# Patient Record
Sex: Female | Born: 1985 | Race: White | Hispanic: No | Smoking: Current every day smoker
Health system: Southern US, Community
[De-identification: ages and names within clinical notes are randomized; demographics above are authoritative.]

## PROBLEM LIST (undated history)

## (undated) DIAGNOSIS — K219 Gastro-esophageal reflux disease without esophagitis: Secondary | ICD-10-CM

## (undated) DIAGNOSIS — N83209 Unspecified ovarian cyst, unspecified side: Secondary | ICD-10-CM

## (undated) DIAGNOSIS — Z1509 Genetic susceptibility to other malignant neoplasm: Secondary | ICD-10-CM

## (undated) DIAGNOSIS — Z1379 Encounter for other screening for genetic and chromosomal anomalies: Principal | ICD-10-CM

## (undated) DIAGNOSIS — N7091 Salpingitis, unspecified: Secondary | ICD-10-CM

## (undated) DIAGNOSIS — E079 Disorder of thyroid, unspecified: Secondary | ICD-10-CM

## (undated) DIAGNOSIS — F419 Anxiety disorder, unspecified: Secondary | ICD-10-CM

## (undated) DIAGNOSIS — T7840XA Allergy, unspecified, initial encounter: Secondary | ICD-10-CM

## (undated) DIAGNOSIS — C801 Malignant (primary) neoplasm, unspecified: Secondary | ICD-10-CM

## (undated) HISTORY — DX: Gastro-esophageal reflux disease without esophagitis: K21.9

## (undated) HISTORY — DX: Anxiety disorder, unspecified: F41.9

## (undated) HISTORY — PX: ROBOTIC ASSISTED TOTAL HYSTERECTOMY: SHX6085

## (undated) HISTORY — DX: Allergy, unspecified, initial encounter: T78.40XA

## (undated) HISTORY — DX: Encounter for other screening for genetic and chromosomal anomalies: Z13.79

## (undated) HISTORY — DX: Genetic susceptibility to other malignant neoplasm: Z15.09

## (undated) HISTORY — DX: Disorder of thyroid, unspecified: E07.9

---

## 2008-09-02 HISTORY — PX: APPENDECTOMY: SHX54

## 2016-09-02 HISTORY — PX: COLONOSCOPY: SHX174

## 2016-09-02 HISTORY — PX: UPPER GASTROINTESTINAL ENDOSCOPY: SHX188

## 2017-03-28 ENCOUNTER — Encounter (HOSPITAL_BASED_OUTPATIENT_CLINIC_OR_DEPARTMENT_OTHER): Payer: Self-pay

## 2017-03-28 ENCOUNTER — Inpatient Hospital Stay (HOSPITAL_BASED_OUTPATIENT_CLINIC_OR_DEPARTMENT_OTHER)
Admission: EM | Admit: 2017-03-28 | Discharge: 2017-04-08 | DRG: 329 | Disposition: A | Discharge status: Home / Self Care | Payer: Medicaid Other | Attending: Internal Medicine | Admitting: Internal Medicine

## 2017-03-28 ENCOUNTER — Emergency Department (HOSPITAL_BASED_OUTPATIENT_CLINIC_OR_DEPARTMENT_OTHER): Payer: Medicaid Other

## 2017-03-28 DIAGNOSIS — C180 Malignant neoplasm of cecum: Principal | ICD-10-CM | POA: Diagnosis present

## 2017-03-28 DIAGNOSIS — D509 Iron deficiency anemia, unspecified: Secondary | ICD-10-CM | POA: Diagnosis present

## 2017-03-28 DIAGNOSIS — D75839 Thrombocytosis, unspecified: Secondary | ICD-10-CM

## 2017-03-28 DIAGNOSIS — Z806 Family history of leukemia: Secondary | ICD-10-CM

## 2017-03-28 DIAGNOSIS — N7091 Salpingitis, unspecified: Secondary | ICD-10-CM | POA: Diagnosis present

## 2017-03-28 DIAGNOSIS — Z681 Body mass index (BMI) 19 or less, adult: Secondary | ICD-10-CM

## 2017-03-28 DIAGNOSIS — F17210 Nicotine dependence, cigarettes, uncomplicated: Secondary | ICD-10-CM | POA: Diagnosis present

## 2017-03-28 DIAGNOSIS — R1903 Right lower quadrant abdominal swelling, mass and lump: Secondary | ICD-10-CM

## 2017-03-28 DIAGNOSIS — D473 Essential (hemorrhagic) thrombocythemia: Secondary | ICD-10-CM

## 2017-03-28 DIAGNOSIS — Z716 Tobacco abuse counseling: Secondary | ICD-10-CM

## 2017-03-28 DIAGNOSIS — N739 Female pelvic inflammatory disease, unspecified: Secondary | ICD-10-CM | POA: Diagnosis present

## 2017-03-28 DIAGNOSIS — Z01818 Encounter for other preprocedural examination: Secondary | ICD-10-CM

## 2017-03-28 DIAGNOSIS — N7093 Salpingitis and oophoritis, unspecified: Secondary | ICD-10-CM

## 2017-03-28 DIAGNOSIS — R188 Other ascites: Secondary | ICD-10-CM | POA: Diagnosis present

## 2017-03-28 DIAGNOSIS — C189 Malignant neoplasm of colon, unspecified: Secondary | ICD-10-CM

## 2017-03-28 DIAGNOSIS — F419 Anxiety disorder, unspecified: Secondary | ICD-10-CM | POA: Diagnosis present

## 2017-03-28 DIAGNOSIS — Z720 Tobacco use: Secondary | ICD-10-CM

## 2017-03-28 DIAGNOSIS — E43 Unspecified severe protein-calorie malnutrition: Secondary | ICD-10-CM | POA: Diagnosis present

## 2017-03-28 DIAGNOSIS — D649 Anemia, unspecified: Secondary | ICD-10-CM

## 2017-03-28 DIAGNOSIS — R52 Pain, unspecified: Secondary | ICD-10-CM

## 2017-03-28 DIAGNOSIS — R599 Enlarged lymph nodes, unspecified: Secondary | ICD-10-CM | POA: Diagnosis present

## 2017-03-28 DIAGNOSIS — D638 Anemia in other chronic diseases classified elsewhere: Secondary | ICD-10-CM | POA: Diagnosis present

## 2017-03-28 DIAGNOSIS — G4700 Insomnia, unspecified: Secondary | ICD-10-CM | POA: Diagnosis present

## 2017-03-28 DIAGNOSIS — Z9049 Acquired absence of other specified parts of digestive tract: Secondary | ICD-10-CM

## 2017-03-28 HISTORY — DX: Salpingitis, unspecified: N70.91

## 2017-03-28 HISTORY — DX: Unspecified ovarian cyst, unspecified side: N83.209

## 2017-03-28 LAB — URINALYSIS, ROUTINE W REFLEX MICROSCOPIC
Bilirubin Urine: NEGATIVE
GLUCOSE, UA: NEGATIVE mg/dL
Hgb urine dipstick: NEGATIVE
Ketones, ur: NEGATIVE mg/dL
NITRITE: NEGATIVE
PH: 7.5 (ref 5.0–8.0)
Protein, ur: NEGATIVE mg/dL
SPECIFIC GRAVITY, URINE: 1.008 (ref 1.005–1.030)

## 2017-03-28 LAB — URINALYSIS, MICROSCOPIC (REFLEX): RBC / HPF: NONE SEEN RBC/hpf (ref 0–5)

## 2017-03-28 LAB — PREGNANCY, URINE: Preg Test, Ur: NEGATIVE

## 2017-03-28 NOTE — ED Provider Notes (Signed)
[place] DEPT [facility] Provider Note   CSN: [id number] Arrival date & time: 03/28/17  2257     History   Chief Complaint Chief Complaint  Patient presents with  . Abdominal Pain    HPI Meagan [name] is a 31 y.o. female.  The history is provided by the patient.  She has been having pain in the right suprapubic area for the last 4 days. Tonight, she noticed a knot there. Pain is worse with palpation, but nothing makes it better. She rates pain at 8/10. She denies radiation of pain. There has been no nausea or vomiting per denies fever, chills, sweats. She denies any urinary symptoms. No vaginal discharge. She does not have menses because she has the Norplanon implant. She had been hospitalized last month with a right tubo-ovarian abscess and is worried that it has recurred.  Past Medical History:  Diagnosis Date  . Fallopian tube abscess    June 2018 right side  . Ovarian cyst     There are no active problems to display for this patient.   Past Surgical History:  Procedure Laterality Date  . APPENDECTOMY  2010    OB History    No data available       Home Medications    Prior to Admission medications   Not on File    Family History No family history on file.  Social History Social History  Substance Use Topics  . Smoking status: Current Every Day Smoker  . Smokeless tobacco: Never Used  . Alcohol use No     Allergies   Patient has no known allergies.   Review of Systems Review of Systems  All other systems reviewed and are negative.    Physical Exam Updated Vital Signs BP 101/71 (BP Location: Left Arm)   Pulse (!) 120   Temp 99.3 F (37.4 C) (Oral)   Resp 18   Ht 5\' 2"  (1.575 m)   Wt 45.4 kg (100 lb)   SpO2 100%   BMI 18.29 kg/m   Physical Exam  Nursing note and vitals reviewed.  31 year old female, resting comfortably and in no acute distress. Vital signs are significant for tachycardia. Oxygen saturation is 100%, which is  normal. Head is normocephalic and atraumatic. PERRLA, EOMI. Oropharynx is clear. Neck is nontender and supple without adenopathy or JVD. Back is nontender and there is no CVA tenderness. Lungs are clear without rales, wheezes, or rhonchi. Chest is nontender. Heart has regular rate and rhythm without murmur. Abdomen is soft, flat with a tender subcutaneous mass in the right suprapubic area. It measures 7 x 8 cm. There are no other masses or hepatosplenomegaly and peristalsis is normoactive. Extremities have no cyanosis or edema, full range of motion is present. Skin is warm and dry without rash. Neurologic: Mental status is normal, cranial nerves are intact, there are no motor or sensory deficits.  ED Treatments / Results  Labs (all labs ordered are listed, but only abnormal results are displayed) Labs Reviewed  URINALYSIS, ROUTINE W REFLEX MICROSCOPIC - Abnormal; Notable for the following:       Result Value   APPearance CLOUDY (*)    Leukocytes, UA SMALL (*)    All other components within normal limits  URINALYSIS, MICROSCOPIC (REFLEX) - Abnormal; Notable for the following:    Bacteria, UA RARE (*)    Squamous Epithelial / LPF 6-30 (*)    All other components within normal limits  COMPREHENSIVE METABOLIC PANEL - Abnormal; Notable for  the following:    Sodium 134 (*)    Chloride 98 (*)    Calcium 8.6 (*)    Albumin 3.2 (*)    ALT 6 (*)    All other components within normal limits  CBC WITH DIFFERENTIAL/PLATELET - Abnormal; Notable for the following:    RBC 3.66 (*)    Hemoglobin 9.5 (*)    HCT 29.9 (*)    RDW 21.4 (*)    Platelets 533 (*)    Eosinophils Absolute 0.8 (*)    All other components within normal limits  PREGNANCY, URINE    Radiology US Transvaginal Non-ob  Result Date: 03/29/2017 CLINICAL DATA:  31 y/o F; right lower quadrant pain with a palpable lump for 4 days. History of right fallopian tube infection in June. EXAM: TRANSABDOMINAL AND TRANSVAGINAL  ULTRASOUND OF PELVIS TECHNIQUE: Both transabdominal and transvaginal ultrasound examinations of the pelvis were performed. Transabdominal technique was performed for global imaging of the pelvis including uterus, ovaries, adnexal regions, and pelvic cul-de-sac. It was necessary to proceed with endovaginal exam following the transabdominal exam to visualize the uterus and bilateral adnexa. COMPARISON:  None FINDINGS: Uterus Measurements: 5.8 x 2.8 x 4.6 cm. No fibroids or other mass visualized. Retroverted. Endometrium Thickness: 4 mm.  No focal abnormality visualized. Right ovary Measurements: 2.9 x 2.2 x 1.7 cm. Limited visibility of the right ovary, no gross abnormality. Tubular structure within the right adnexa isoechoic to the uterus with multiple small echogenic foci in the wall "Beads on a string" sign. The tubular structure does not demonstrate peristalsis during the ultrasound examination and there is no bowel wall echo signature. Left ovary Measurements: 2.2 x 1.5 x 1.7 cm. 1.2 cm simple ovarian follicle. Other findings Small volume of fluid in the pelvis. IMPRESSION: Right adnexal aperistaltic tubular structure containing complex fluid. Findings probably represent pyosalpinx given history of prior fallopian tube infection. Echogenic foci within the wall is an indicator of chronic inflammation. Electronically Signed   By: Kristine Garbe M.D.   On: 03/29/2017 00:29   US Pelvis Complete  Result Date: 03/29/2017 CLINICAL DATA:  31 y/o F; right lower quadrant pain with a palpable lump for 4 days. History of right fallopian tube infection in June. EXAM: TRANSABDOMINAL AND TRANSVAGINAL ULTRASOUND OF PELVIS TECHNIQUE: Both transabdominal and transvaginal ultrasound examinations of the pelvis were performed. Transabdominal technique was performed for global imaging of the pelvis including uterus, ovaries, adnexal regions, and pelvic cul-de-sac. It was necessary to proceed with endovaginal exam  following the transabdominal exam to visualize the uterus and bilateral adnexa. COMPARISON:  None FINDINGS: Uterus Measurements: 5.8 x 2.8 x 4.6 cm. No fibroids or other mass visualized. Retroverted. Endometrium Thickness: 4 mm.  No focal abnormality visualized. Right ovary Measurements: 2.9 x 2.2 x 1.7 cm. Limited visibility of the right ovary, no gross abnormality. Tubular structure within the right adnexa isoechoic to the uterus with multiple small echogenic foci in the wall "Beads on a string" sign. The tubular structure does not demonstrate peristalsis during the ultrasound examination and there is no bowel wall echo signature. Left ovary Measurements: 2.2 x 1.5 x 1.7 cm. 1.2 cm simple ovarian follicle. Other findings Small volume of fluid in the pelvis. IMPRESSION: Right adnexal aperistaltic tubular structure containing complex fluid. Findings probably represent pyosalpinx given history of prior fallopian tube infection. Echogenic foci within the wall is an indicator of chronic inflammation. Electronically Signed   By: Kristine Garbe M.D.   On: 03/29/2017 00:29    Procedures  Procedures (including critical care time)  Medications Ordered in ED Medications - No data to display   Initial Impression / Assessment and Plan / ED Course  I have reviewed the triage vital signs and the nursing notes.  Pertinent labs & imaging results that were available during my care of the patient were reviewed by me and considered in my medical decision making (see chart for details).  Suprapubic mass which is tender. Old records are reviewed, and she had been admitted to [facility] with presumed tubo-ovarian abscess last month, symptoms improved and she was discharged on antibiotics without surgical management. Of note, she had an appendectomy previously when she had laparoscopy for abdominal pain which was found to be mittelschmerz.  Ultrasound shows presence of right-sided tubo-ovarian  abscess. It is still not clear if this corresponds to the tender mass that is palpable on exam. Laboratory workup showed normal WBC, and moderate anemia which is unchanged from baseline. Thrombocytosis is present and is likely reactive. Patient had been cared for at [facility] and requested transfer there. I discussed the case with Dr. Gerrit Friends, who agreed to accept the patient, but beds were not available. Therefore, case was discussed with Dr. Glo Herring at [facility] who agreed to accept the patient in transfer. She was started on antibiotics-cefoxitin and doxycycline.  Final Clinical Impressions(s) / ED Diagnoses   Final diagnoses:  Pain  Tubo-ovarian abscess  Normochromic normocytic anemia  Thrombocytosis (HCC)    New Prescriptions New Prescriptions   No medications on file     Delora Fuel, MD 09/98/33 0301

## 2017-03-28 NOTE — ED Triage Notes (Signed)
Pt c/o RLQ pain and tenderness, she has a hard mass to the area, she had a fallopian tube abscess on the same side in June and she states it feels the same, the pain has been there for 4 days, she just noticed the hard mass today

## 2017-03-29 ENCOUNTER — Inpatient Hospital Stay (HOSPITAL_COMMUNITY): Payer: Medicaid Other

## 2017-03-29 ENCOUNTER — Encounter (HOSPITAL_COMMUNITY): Payer: Self-pay

## 2017-03-29 ENCOUNTER — Inpatient Hospital Stay (HOSPITAL_COMMUNITY): Admission: AD | Admit: 2017-03-29 | Payer: Self-pay | Source: Ambulatory Visit | Admitting: Internal Medicine

## 2017-03-29 DIAGNOSIS — Z9049 Acquired absence of other specified parts of digestive tract: Secondary | ICD-10-CM | POA: Diagnosis not present

## 2017-03-29 DIAGNOSIS — D638 Anemia in other chronic diseases classified elsewhere: Secondary | ICD-10-CM | POA: Diagnosis present

## 2017-03-29 DIAGNOSIS — N739 Female pelvic inflammatory disease, unspecified: Secondary | ICD-10-CM | POA: Diagnosis present

## 2017-03-29 DIAGNOSIS — Z681 Body mass index (BMI) 19 or less, adult: Secondary | ICD-10-CM | POA: Diagnosis not present

## 2017-03-29 DIAGNOSIS — R1903 Right lower quadrant abdominal swelling, mass and lump: Secondary | ICD-10-CM | POA: Diagnosis present

## 2017-03-29 DIAGNOSIS — R188 Other ascites: Secondary | ICD-10-CM | POA: Diagnosis present

## 2017-03-29 DIAGNOSIS — N7093 Salpingitis and oophoritis, unspecified: Secondary | ICD-10-CM | POA: Insufficient documentation

## 2017-03-29 DIAGNOSIS — N7091 Salpingitis, unspecified: Secondary | ICD-10-CM | POA: Diagnosis present

## 2017-03-29 DIAGNOSIS — D649 Anemia, unspecified: Secondary | ICD-10-CM | POA: Diagnosis present

## 2017-03-29 DIAGNOSIS — D509 Iron deficiency anemia, unspecified: Secondary | ICD-10-CM | POA: Diagnosis present

## 2017-03-29 DIAGNOSIS — Z716 Tobacco abuse counseling: Secondary | ICD-10-CM | POA: Diagnosis not present

## 2017-03-29 DIAGNOSIS — Z806 Family history of leukemia: Secondary | ICD-10-CM | POA: Diagnosis not present

## 2017-03-29 DIAGNOSIS — R599 Enlarged lymph nodes, unspecified: Secondary | ICD-10-CM | POA: Diagnosis present

## 2017-03-29 DIAGNOSIS — E43 Unspecified severe protein-calorie malnutrition: Secondary | ICD-10-CM | POA: Diagnosis present

## 2017-03-29 DIAGNOSIS — F419 Anxiety disorder, unspecified: Secondary | ICD-10-CM | POA: Diagnosis present

## 2017-03-29 DIAGNOSIS — G4700 Insomnia, unspecified: Secondary | ICD-10-CM | POA: Diagnosis present

## 2017-03-29 DIAGNOSIS — C180 Malignant neoplasm of cecum: Secondary | ICD-10-CM | POA: Diagnosis not present

## 2017-03-29 DIAGNOSIS — D500 Iron deficiency anemia secondary to blood loss (chronic): Secondary | ICD-10-CM | POA: Diagnosis not present

## 2017-03-29 DIAGNOSIS — F17210 Nicotine dependence, cigarettes, uncomplicated: Secondary | ICD-10-CM | POA: Diagnosis present

## 2017-03-29 LAB — CBC WITH DIFFERENTIAL/PLATELET
BASOS PCT: 0 %
Basophils Absolute: 0 10*3/uL (ref 0.0–0.1)
EOS PCT: 9 %
Eosinophils Absolute: 0.8 10*3/uL — ABNORMAL HIGH (ref 0.0–0.7)
HEMATOCRIT: 29.9 % — AB (ref 36.0–46.0)
HEMOGLOBIN: 9.5 g/dL — AB (ref 12.0–15.0)
LYMPHS ABS: 1.8 10*3/uL (ref 0.7–4.0)
LYMPHS PCT: 21 %
MCH: 26 pg (ref 26.0–34.0)
MCHC: 31.8 g/dL (ref 30.0–36.0)
MCV: 81.7 fL (ref 78.0–100.0)
MONOS PCT: 8 %
Monocytes Absolute: 0.7 10*3/uL (ref 0.1–1.0)
NEUTROS ABS: 5.3 10*3/uL (ref 1.7–7.7)
Neutrophils Relative %: 62 %
Platelets: 533 10*3/uL — ABNORMAL HIGH (ref 150–400)
RBC: 3.66 MIL/uL — ABNORMAL LOW (ref 3.87–5.11)
RDW: 21.4 % — AB (ref 11.5–15.5)
WBC: 8.6 10*3/uL (ref 4.0–10.5)

## 2017-03-29 LAB — COMPREHENSIVE METABOLIC PANEL
ALBUMIN: 3.2 g/dL — AB (ref 3.5–5.0)
ALK PHOS: 69 U/L (ref 38–126)
ALT: 6 U/L — ABNORMAL LOW (ref 14–54)
ANION GAP: 10 (ref 5–15)
AST: 15 U/L (ref 15–41)
BILIRUBIN TOTAL: 0.3 mg/dL (ref 0.3–1.2)
BUN: 8 mg/dL (ref 6–20)
CO2: 26 mmol/L (ref 22–32)
Calcium: 8.6 mg/dL — ABNORMAL LOW (ref 8.9–10.3)
Chloride: 98 mmol/L — ABNORMAL LOW (ref 101–111)
Creatinine, Ser: 0.75 mg/dL (ref 0.44–1.00)
GFR calc Af Amer: >60 mL/min (ref >60)
GFR calc non Af Amer: >60 mL/min (ref >60)
GLUCOSE: 97 mg/dL (ref 65–99)
POTASSIUM: 3.5 mmol/L (ref 3.5–5.1)
SODIUM: 134 mmol/L — AB (ref 135–145)
Total Protein: 8.1 g/dL (ref 6.5–8.1)

## 2017-03-29 LAB — RAPID URINE DRUG SCREEN, HOSP PERFORMED
Amphetamines: NOT DETECTED
BARBITURATES: NOT DETECTED
BENZODIAZEPINES: NOT DETECTED
Cocaine: NOT DETECTED
Opiates: POSITIVE — AB
Tetrahydrocannabinol: NOT DETECTED

## 2017-03-29 LAB — CREATININE, SERUM: Creatinine, Ser: 0.57 mg/dL (ref 0.44–1.00)

## 2017-03-29 LAB — CBC
HCT: 27 % — ABNORMAL LOW (ref 36.0–46.0)
HEMOGLOBIN: 8.3 g/dL — AB (ref 12.0–15.0)
MCH: 25.3 pg — ABNORMAL LOW (ref 26.0–34.0)
MCHC: 30.7 g/dL (ref 30.0–36.0)
MCV: 82.3 fL (ref 78.0–100.0)
PLATELETS: 418 10*3/uL — AB (ref 150–400)
RBC: 3.28 MIL/uL — AB (ref 3.87–5.11)
RDW: 21.5 % — ABNORMAL HIGH (ref 11.5–15.5)
WBC: 6.9 10*3/uL (ref 4.0–10.5)

## 2017-03-29 LAB — TYPE AND SCREEN
ABO/RH(D): O POS
ANTIBODY SCREEN: NEGATIVE

## 2017-03-29 LAB — ABO/RH: ABO/RH(D): O POS

## 2017-03-29 LAB — SEDIMENTATION RATE: SED RATE: 72 mm/h — AB (ref 0–22)

## 2017-03-29 MED ORDER — MORPHINE SULFATE (PF) 4 MG/ML IV SOLN: 4.0000 mg | INTRAVENOUS | Status: DC | PRN

## 2017-03-29 MED ORDER — DOXYCYCLINE HYCLATE 100 MG IV SOLR
INTRAVENOUS | Status: AC
  Filled 2017-03-29: qty 100

## 2017-03-29 MED ORDER — IOPAMIDOL (ISOVUE-300) INJECTION 61%
100.0000 mL | Freq: Once | INTRAVENOUS | Status: AC | PRN
  Administered 2017-03-29: 80 mL via INTRAVENOUS

## 2017-03-29 MED ORDER — PRENATAL MULTIVITAMIN CH: 1.0000 | ORAL_TABLET | Freq: Every day | ORAL | Status: DC

## 2017-03-29 MED ORDER — DEXTROSE 5 % IV SOLN
1.0000 g | Freq: Two times a day (BID) | INTRAVENOUS | Status: DC
  Filled 2017-03-29: qty 1

## 2017-03-29 MED ORDER — ONDANSETRON HCL 4 MG/2ML IJ SOLN
4.0000 mg | Freq: Four times a day (QID) | INTRAMUSCULAR | Status: DC | PRN
  Administered 2017-04-03: 4 mg via INTRAVENOUS
  Filled 2017-03-29: qty 2

## 2017-03-29 MED ORDER — NICOTINE 21 MG/24HR TD PT24
21.0000 mg | MEDICATED_PATCH | Freq: Every day | TRANSDERMAL | Status: DC
  Administered 2017-03-30 – 2017-03-31 (×2): 21 mg via TRANSDERMAL
  Filled 2017-03-29 (×7): qty 1

## 2017-03-29 MED ORDER — CEFOTETAN DISODIUM 2 G IJ SOLR
2.0000 g | Freq: Two times a day (BID) | INTRAMUSCULAR | Status: DC
  Filled 2017-03-29: qty 2

## 2017-03-29 MED ORDER — MORPHINE SULFATE (PF) 4 MG/ML IV SOLN
4.0000 mg | Freq: Once | INTRAVENOUS | Status: AC
  Administered 2017-03-29: 4 mg via INTRAVENOUS
  Filled 2017-03-29: qty 1

## 2017-03-29 MED ORDER — DEXTROSE 5 % IV SOLN
1.0000 g | Freq: Two times a day (BID) | INTRAVENOUS | Status: DC
  Administered 2017-03-29: 1 g via INTRAVENOUS
  Filled 2017-03-29 (×3): qty 1

## 2017-03-29 MED ORDER — SODIUM CHLORIDE 0.9 % IV SOLN
INTRAVENOUS | Status: DC
  Administered 2017-03-29 – 2017-03-31 (×2): via INTRAVENOUS

## 2017-03-29 MED ORDER — DEXTROSE 5 % IV SOLN
2.0000 g | Freq: Once | INTRAVENOUS | Status: AC
  Administered 2017-03-29: 2 g via INTRAVENOUS
  Filled 2017-03-29: qty 2

## 2017-03-29 MED ORDER — ACETAMINOPHEN 325 MG PO TABS: 650.0000 mg | ORAL_TABLET | Freq: Four times a day (QID) | ORAL | Status: DC | PRN

## 2017-03-29 MED ORDER — SODIUM CHLORIDE 0.9 % IV BOLUS (SEPSIS)
1000.0000 mL | Freq: Once | INTRAVENOUS | Status: AC
  Administered 2017-03-29: 1000 mL via INTRAVENOUS

## 2017-03-29 MED ORDER — HYDROMORPHONE HCL 2 MG PO TABS
1.0000 mg | ORAL_TABLET | ORAL | Status: DC | PRN
  Administered 2017-03-30 – 2017-04-03 (×12): 1 mg via ORAL
  Filled 2017-03-29 (×12): qty 1

## 2017-03-29 MED ORDER — ENOXAPARIN SODIUM 40 MG/0.4ML ~~LOC~~ SOLN
40.0000 mg | SUBCUTANEOUS | Status: DC
  Filled 2017-03-29: qty 0.4

## 2017-03-29 MED ORDER — HYDROMORPHONE HCL 2 MG PO TABS
2.0000 mg | ORAL_TABLET | ORAL | Status: DC | PRN
  Administered 2017-03-29 (×2): 2 mg via ORAL
  Filled 2017-03-29 (×2): qty 1

## 2017-03-29 MED ORDER — DOXYCYCLINE HYCLATE 100 MG PO TABS
100.0000 mg | ORAL_TABLET | Freq: Two times a day (BID) | ORAL | Status: DC
  Administered 2017-03-29: 100 mg via ORAL
  Filled 2017-03-29 (×3): qty 1

## 2017-03-29 MED ORDER — DOCUSATE SODIUM 100 MG PO CAPS: 100.0000 mg | ORAL_CAPSULE | Freq: Two times a day (BID) | ORAL | Status: DC | PRN

## 2017-03-29 MED ORDER — DOXYCYCLINE HYCLATE 100 MG IV SOLR
100.0000 mg | Freq: Once | INTRAVENOUS | Status: AC
  Administered 2017-03-29: 100 mg via INTRAVENOUS
  Filled 2017-03-29: qty 100

## 2017-03-29 MED ORDER — IOPAMIDOL (ISOVUE-300) INJECTION 61%
30.0000 mL | INTRAVENOUS | Status: AC
  Administered 2017-03-29 (×2): 30 mL via ORAL

## 2017-03-29 NOTE — H&P (Signed)
History and Physical    Meagan [name] INO:[id number] DOB: 1986-02-04 DOA: 03/28/2017  PCP: Patient, No Pcp Per  Patient coming from: Home, Transferred from [facility]  I have personally briefly reviewed patient's old medical records in [place]  Chief Complaint: RLQ abd pain  HPI: Meagan [name] is a 31 y.o. female with medical history significant of Appendectomy in 2010.  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  Started on ABx.  CT scan showed 10cm mass worrisome for primary malignancy that seems to rise from the cecum.  GI Dr. Loletha Carrow saw patient, initially though that surgery should be consulted and didn't want to do colonoscopy.  Dr. Redmond Pulling saw patient, felt that colonoscopy should be performed.  Per his note called Dr. Loletha Carrow back, and decision was made to transfer to [facility] for GI to see patient tomorrow Nancy Fetter), likely for bowel prep and subsequent colonoscopy Monday.    Review of Systems: As per HPI otherwise 10 point review of systems negative.   Past Medical History:  Diagnosis Date  . Fallopian tube abscess    June 2018 right side  . Ovarian cyst     Past Surgical History:  Procedure Laterality Date  . APPENDECTOMY  2010     reports that she has been smoking.  She has never used smokeless tobacco. She reports that she does not drink alcohol or use drugs.  No Known Allergies  Family History  Problem Relation Age of Onset  . Leukemia Father        "Just got diagnosed, but not the 'serious type'" (sounds like CLL if I had to guess)  No other malignancy history in family.   Prior to Admission medications   Medication Sig Start Date End Date Taking? Authorizing Provider  ibuprofen (ADVIL,MOTRIN) 800 MG tablet Take 800 mg by mouth. 02/15/17  Yes [provider]    Physical Exam: Vitals:   03/29/17  1258 03/29/17 1635 03/29/17 2003 03/29/17 2238  BP: 99/64 (!) 84/61 94/65 98/66   Pulse: (!) 104 97 100 (!) 107  Resp: 18 18 18 18   Temp: 98.4 F (36.9 C) 97.9 F (36.6 C) 99.3 F (37.4 C) 98 F (36.7 C)  TempSrc: Oral Oral Oral Oral  SpO2: 100% 98% 100% 100%  Weight:    45.3 kg (99 lb 14.4 oz)  Height:    5\' 2"  (1.575 m)    Constitutional: NAD, calm, comfortable Eyes: PERRL, lids and conjunctivae normal ENMT: Mucous membranes are moist. Posterior pharynx clear of any exudate or lesions.Normal dentition.  Neck: normal, supple, no masses, no thyromegaly Respiratory: clear to auscultation bilaterally, no wheezing, no crackles. Normal respiratory effort. No accessory muscle use.  Cardiovascular: Regular rate and rhythm, no murmurs / rubs / gallops. No extremity edema. 2+ pedal pulses. No carotid bruits.  Abdomen: Tender mass RLQ  Musculoskeletal: no clubbing / cyanosis. No joint deformity upper and lower extremities. Good ROM, no contractures. Normal muscle tone.  Skin: no rashes, lesions, ulcers. No induration Neurologic: CN 2-12 grossly intact. Sensation intact, DTR normal. Strength 5/5 in all 4.  Psychiatric: Patient is anxious and understandably unhappy given that she has just been told earlier today that physicians are concerned she likely has a neoplastic process.   Labs on Admission: I have personally reviewed following labs and imaging studies  CBC:  Recent Labs Lab 03/29/17 0034 03/29/17 0640  WBC 8.6 6.9  NEUTROABS 5.3  --   HGB 9.5* 8.3*  HCT 29.9* 27.0*  MCV 81.7 82.3  PLT 533* 169*   Basic Metabolic Panel:  Recent Labs Lab 03/29/17 0034 03/29/17 0640  NA 134*  --   K 3.5  --   CL 98*  --   CO2 26  --   GLUCOSE 97  --   BUN 8  --   CREATININE 0.75 0.57  CALCIUM 8.6*  --    GFR: Estimated Creatinine Clearance: 72.2 mL/min (by C-G formula based on SCr of 0.57 mg/dL). Liver Function Tests:  Recent Labs Lab 03/29/17 0034  AST 15  ALT 6*  ALKPHOS  69  BILITOT 0.3  PROT 8.1  ALBUMIN 3.2*   No results for input(s): LIPASE, AMYLASE in the last 168 hours. No results for input(s): AMMONIA in the last 168 hours. Coagulation Profile: No results for input(s): INR, PROTIME in the last 168 hours. Cardiac Enzymes: No results for input(s): CKTOTAL, CKMB, CKMBINDEX, TROPONINI in the last 168 hours. BNP (last 3 results) No results for input(s): PROBNP in the last 8760 hours. HbA1C: No results for input(s): HGBA1C in the last 72 hours. CBG: No results for input(s): GLUCAP in the last 168 hours. Lipid Profile: No results for input(s): CHOL, HDL, LDLCALC, TRIG, CHOLHDL, LDLDIRECT in the last 72 hours. Thyroid Function Tests: No results for input(s): TSH, T4TOTAL, FREET4, T3FREE, THYROIDAB in the last 72 hours. Anemia Panel: No results for input(s): VITAMINB12, FOLATE, FERRITIN, TIBC, IRON, RETICCTPCT in the last 72 hours. Urine analysis:    Component Value Date/Time   COLORURINE YELLOW 03/28/2017 2310   APPEARANCEUR CLOUDY (A) 03/28/2017 2310   LABSPEC 1.008 03/28/2017 2310   PHURINE 7.5 03/28/2017 2310   GLUCOSEU NEGATIVE 03/28/2017 2310   HGBUR NEGATIVE 03/28/2017 2310   BILIRUBINUR NEGATIVE 03/28/2017 2310   KETONESUR NEGATIVE 03/28/2017 2310   PROTEINUR NEGATIVE 03/28/2017 2310   NITRITE NEGATIVE 03/28/2017 2310   LEUKOCYTESUR SMALL (A) 03/28/2017 2310    Radiological Exams on Admission: US Transvaginal Non-ob  Result Date: 03/29/2017 CLINICAL DATA:  31 y/o F; right lower quadrant pain with a palpable lump for 4 days. History of right fallopian tube infection in June. EXAM: TRANSABDOMINAL AND TRANSVAGINAL ULTRASOUND OF PELVIS TECHNIQUE: Both transabdominal and transvaginal ultrasound examinations of the pelvis were performed. Transabdominal technique was performed for global imaging of the pelvis including uterus, ovaries, adnexal regions, and pelvic cul-de-sac. It was necessary to proceed with endovaginal exam following the  transabdominal exam to visualize the uterus and bilateral adnexa. COMPARISON:  None FINDINGS: Uterus Measurements: 5.8 x 2.8 x 4.6 cm. No fibroids or other mass visualized. Retroverted. Endometrium Thickness: 4 mm.  No focal abnormality visualized. Right ovary Measurements: 2.9 x 2.2 x 1.7 cm. Limited visibility of the right ovary, no gross abnormality. Tubular structure within the right adnexa isoechoic to the uterus with multiple small echogenic foci in the wall "Beads on a string" sign. The tubular structure does not demonstrate peristalsis during the ultrasound examination and there is no bowel wall echo signature. Left ovary Measurements: 2.2 x 1.5 x 1.7 cm. 1.2 cm simple ovarian follicle. Other findings Small volume of fluid in the pelvis. IMPRESSION: Right adnexal aperistaltic tubular structure containing complex fluid. Findings probably represent pyosalpinx given history of prior fallopian tube infection. Echogenic foci within the wall is an indicator of chronic inflammation. Electronically Signed   By: Edgardo Roys.D.  On: 03/29/2017 00:29   US Pelvis Complete  Result Date: 03/29/2017 CLINICAL DATA:  31 y/o F; right lower quadrant pain with a palpable lump for 4 days. History of right fallopian tube infection in June. EXAM: TRANSABDOMINAL AND TRANSVAGINAL ULTRASOUND OF PELVIS TECHNIQUE: Both transabdominal and transvaginal ultrasound examinations of the pelvis were performed. Transabdominal technique was performed for global imaging of the pelvis including uterus, ovaries, adnexal regions, and pelvic cul-de-sac. It was necessary to proceed with endovaginal exam following the transabdominal exam to visualize the uterus and bilateral adnexa. COMPARISON:  None FINDINGS: Uterus Measurements: 5.8 x 2.8 x 4.6 cm. No fibroids or other mass visualized. Retroverted. Endometrium Thickness: 4 mm.  No focal abnormality visualized. Right ovary Measurements: 2.9 x 2.2 x 1.7 cm. Limited visibility of  the right ovary, no gross abnormality. Tubular structure within the right adnexa isoechoic to the uterus with multiple small echogenic foci in the wall "Beads on a string" sign. The tubular structure does not demonstrate peristalsis during the ultrasound examination and there is no bowel wall echo signature. Left ovary Measurements: 2.2 x 1.5 x 1.7 cm. 1.2 cm simple ovarian follicle. Other findings Small volume of fluid in the pelvis. IMPRESSION: Right adnexal aperistaltic tubular structure containing complex fluid. Findings probably represent pyosalpinx given history of prior fallopian tube infection. Echogenic foci within the wall is an indicator of chronic inflammation. Electronically Signed   By: Kristine Garbe M.D.   On: 03/29/2017 00:29   Ct Abdomen Pelvis W Contrast  Result Date: 03/29/2017 CLINICAL DATA:  Abdominal pain for 6 weeks. History of tubo-ovarian abscess. EXAM: CT ABDOMEN AND PELVIS WITH CONTRAST TECHNIQUE: Multidetector CT imaging of the abdomen and pelvis was performed using the standard protocol following bolus administration of intravenous contrast. CONTRAST:  89mL ISOVUE-300 IOPAMIDOL (ISOVUE-300) INJECTION 61% COMPARISON:  None. FINDINGS: Lower chest: No acute abnormality. Hepatobiliary: No focal liver abnormality is seen. No gallstones, gallbladder wall thickening, or biliary dilatation. Pancreas: Unremarkable. No pancreatic ductal dilatation or surrounding inflammatory changes. Spleen: Normal in size without focal abnormality. Adrenals/Urinary Tract: The adrenal glands are normal. Kidneys are unremarkable. Unremarkable appearance of the urinary bladder. Stomach/Bowel: The stomach is normal. No pathologic dilatation of the large or small bowel loops. Vascular/Lymphatic: Normal appearance of the abdominal aorta. At the level of the aortic bifurcation there is a right common iliac node measuring 1.4 cm, image 38 of series 2. Adjacent enlarged lymph node measures 1.1 cm, image 40  of series 2. Reproductive: The uterus is grossly unremarkable. Other: Within the right iliac fossa there is a large necrotic mass with thick enhancing rim. The mass measures 10.3 cm, image number 32 of series 602. It is unclear whether not this mass is arising from the right adnexa or from the cecum. There is S suture material associated with this mass which is of on certain etiology. Multiple smaller surrounding masses are identified. For example within the right lower quadrant mesenteric there is a 2.6 cm solid mass, image 48 of series 2. Areas of loculated ascites are identified within the dependent portion of the pelvis. Musculoskeletal: No acute or significant osseous findings. IMPRESSION: 1. There is a large thick walled necrotic mass within the right lower quadrant of the abdomen centered around the right iliac fossa. This measures up to 10 cm in length. Highly suspicious for malignancy. Origin is indeterminate. This may be arising from the cecum or right adnexa. At this time there is no evidence for bowel obstruction or obstructive uropathy. 2. 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 3. Loculated ascites within the pelvis. Electronically Signed   By: Kerby Moors M.D.   On: 03/29/2017 10:18    EKG: Independently reviewed.  Assessment/Plan Principal Problem:   Right lower quadrant abdominal mass Active Problems:   Anemia    1. RLQ abd mass - 1. Concern that this likely represents primary malignancy seems to be favored over infectious cause especially given recent history, lack of SIRS (other than mild tachycardia) 2. As Per Dr. Dois Davenport note recs: 1. Clear liquid diet 2. Have GI see patient tomorrow (Sunday) likely to begin bowel prep and then colonoscopy Monday 3. Abx DC'd 3. Dilaudid PRN pain 2. Anemia - 1. Repeat CBC in AM  DVT prophylaxis: SCDs Code Status: Full Family Communication: Husband at bedside Disposition Plan: Home after  admit Consults called: None called, Surg and GI have seen earlier today Admission status: Admit to inpatient   [place], [place] [facility] Pager [phone]  If 7AM-7PM, please contact day team taking care of patient [email or web address] Password [email or web address]  03/29/2017, 11:26 PM

## 2017-03-29 NOTE — ED Notes (Signed)
Alert, NAD, calm, interactive, resps e/u, speaking in clear complete sentences, no dyspnea noted, skin W&D, VSS, rates pain 4/10, (denies: sob, nausea, dizziness or visual changes). Pt playing phone games. Family at [facility].

## 2017-03-29 NOTE — H&P (Signed)
Meagan [name] is an 31 y.o. female. She is a G3 P3, SVD x 3, on Nexplanon, who was recently admitted in [facility] for PID, discharged after 3 day stay one month ago to complete an outpatient oral antibiotic dose including Doxycycline. She had not heard from [facility] re; followup. She was in her usual state of health, tolerating diet, until developing Rlq discomfort over the last few days, no fever nausea or vomiting, and then she felt a mass in RLQ, and presented to [facility] where the mass is confirmed on abdominal exam, and U/S shows an anteflexed retroverted uterus with an adjacent serpentine mass with internal debris suggesting chronic inflammation  Pertinent Gynecological History: Menses: amenorrheic x 18 months due to Nexplanon Bleeding:  Contraception: Nexplanon DES exposure: unknown Blood transfusions: none Sexually transmitted diseases: recent diagnosis: PID with TOA /d/c from hospital at [facility] day. Previous GYN Procedures:   Last mammogram:  Date:  Last pap:  Date:  OB History: G3, P3  Surgical History: S/p APPENDECTOMY Menstrual History: Menarche age:  No LMP recorded. Patient has had an implant.    Past Medical History:  Diagnosis Date  . Fallopian tube abscess    June 2018 right side  . Ovarian cyst     Past Surgical History:  Procedure Laterality Date  . APPENDECTOMY  2010    History reviewed. No pertinent family history.  Social History:  reports that she has been smoking.  She has never used smokeless tobacco. She reports that she does not drink alcohol or use drugs.  Allergies: No Known Allergies  No prescriptions prior to admission.    Review of Systems  Constitutional: Negative for fever and weight loss.  Cardiovascular: Negative.   Gastrointestinal: Positive for abdominal pain. Negative for diarrhea, nausea and vomiting.  Genitourinary: Negative for dysuria.  Skin: Negative.   Neurological: Negative.    Psychiatric/Behavioral: Negative.     Blood pressure (!) 95/59, pulse 92, temperature 98.4 F (36.9 C), temperature source Oral, resp. rate 18, height 5' 2"  (1.575 m), weight 100 lb (45.4 kg), SpO2 98 %. Physical Exam  Constitutional: She is oriented to person, place, and time. She appears well-developed and well-nourished.  HENT:  Head: Normocephalic.  Eyes: Pupils are equal, round, and reactive to light.  Neck: Normal range of motion.  Cardiovascular: Normal rate.   Respiratory: Effort normal and breath sounds normal.  GI: Soft. Bowel sounds are normal. She exhibits mass. She exhibits no distension.  FIRM HARD RLQ MASS , 8 CM AREA OF FIRMNESS  Musculoskeletal: Normal range of motion.  Neurological: She is alert and oriented to person, place, and time. She has normal reflexes.  Skin: Skin is warm and dry.  Psychiatric: She has a normal mood and affect.    Results for orders placed or performed during the hospital encounter of 03/28/17 (from the past 24 hour(s))  Urinalysis, Routine w reflex microscopic     Status: Abnormal   Collection Time: 03/28/17 11:10 PM  Result Value Ref Range   Color, Urine YELLOW YELLOW   APPearance CLOUDY (A) CLEAR   Specific Gravity, Urine 1.008 1.005 - 1.030   pH 7.5 5.0 - 8.0   Glucose, UA NEGATIVE NEGATIVE mg/dL   Hgb urine dipstick NEGATIVE NEGATIVE   Bilirubin Urine NEGATIVE NEGATIVE   Ketones, ur NEGATIVE NEGATIVE mg/dL   Protein, ur NEGATIVE NEGATIVE mg/dL   Nitrite NEGATIVE NEGATIVE   Leukocytes, UA SMALL (A) NEGATIVE  Pregnancy, urine  Status: None   Collection Time: 03/28/17 11:10 PM  Result Value Ref Range   Preg Test, Ur NEGATIVE NEGATIVE  Urinalysis, Microscopic (reflex)     Status: Abnormal   Collection Time: 03/28/17 11:10 PM  Result Value Ref Range   RBC / HPF NONE SEEN 0 - 5 RBC/hpf   WBC, UA 0-5 0 - 5 WBC/hpf   Bacteria, UA RARE (A) NONE SEEN   Squamous Epithelial / LPF 6-30 (A) NONE SEEN  Comprehensive metabolic panel      Status: Abnormal   Collection Time: 03/29/17 12:34 AM  Result Value Ref Range   Sodium 134 (L) 135 - 145 mmol/L   Potassium 3.5 3.5 - 5.1 mmol/L   Chloride 98 (L) 101 - 111 mmol/L   CO2 26 22 - 32 mmol/L   Glucose, Bld 97 65 - 99 mg/dL   BUN 8 6 - 20 mg/dL   Creatinine, Ser 0.75 0.44 - 1.00 mg/dL   Calcium 8.6 (L) 8.9 - 10.3 mg/dL   Total Protein 8.1 6.5 - 8.1 g/dL   Albumin 3.2 (L) 3.5 - 5.0 g/dL   AST 15 15 - 41 U/L   ALT 6 (L) 14 - 54 U/L   Alkaline Phosphatase 69 38 - 126 U/L   Total Bilirubin 0.3 0.3 - 1.2 mg/dL   GFR calc non Af Amer >60 >60 mL/min   GFR calc Af Amer >60 >60 mL/min   Anion gap 10 5 - 15  CBC with Differential     Status: Abnormal   Collection Time: 03/29/17 12:34 AM  Result Value Ref Range   WBC 8.6 4.0 - 10.5 K/uL   RBC 3.66 (L) 3.87 - 5.11 MIL/uL   Hemoglobin 9.5 (L) 12.0 - 15.0 g/dL   HCT 29.9 (L) 36.0 - 46.0 %   MCV 81.7 78.0 - 100.0 fL   MCH 26.0 26.0 - 34.0 pg   MCHC 31.8 30.0 - 36.0 g/dL   RDW 21.4 (H) 11.5 - 15.5 %   Platelets 533 (H) 150 - 400 K/uL   Neutrophils Relative % 62 %   Lymphocytes Relative 21 %   Monocytes Relative 8 %   Eosinophils Relative 9 %   Basophils Relative 0 %   Neutro Abs 5.3 1.7 - 7.7 K/uL   Lymphs Abs 1.8 0.7 - 4.0 K/uL   Monocytes Absolute 0.7 0.1 - 1.0 K/uL   Eosinophils Absolute 0.8 (H) 0.0 - 0.7 K/uL   Basophils Absolute 0.0 0.0 - 0.1 K/uL   RBC Morphology TEARDROP CELLS    WBC Morphology WHITE COUNT CONFIRMED ON SMEAR    Smear Review PLATELET COUNT CONFIRMED BY SMEAR     US Transvaginal Non-ob  Result Date: 03/29/2017 CLINICAL DATA:  31 y/o F; right lower quadrant pain with a palpable lump for 4 days. History of right fallopian tube infection in June. EXAM: TRANSABDOMINAL AND TRANSVAGINAL ULTRASOUND OF PELVIS TECHNIQUE: Both transabdominal and transvaginal ultrasound examinations of the pelvis were performed. Transabdominal technique was performed for global imaging of the pelvis including uterus,  ovaries, adnexal regions, and pelvic cul-de-sac. It was necessary to proceed with endovaginal exam following the transabdominal exam to visualize the uterus and bilateral adnexa. COMPARISON:  None FINDINGS: Uterus Measurements: 5.8 x 2.8 x 4.6 cm. No fibroids or other mass visualized. Retroverted. Endometrium Thickness: 4 mm.  No focal abnormality visualized. Right ovary Measurements: 2.9 x 2.2 x 1.7 cm. Limited visibility of the right ovary, no gross abnormality. Tubular structure within the right adnexa  isoechoic to the uterus with multiple small echogenic foci in the wall "Beads on a string" sign. The tubular structure does not demonstrate peristalsis during the ultrasound examination and there is no bowel wall echo signature. Left ovary Measurements: 2.2 x 1.5 x 1.7 cm. 1.2 cm simple ovarian follicle. Other findings Small volume of fluid in the pelvis. IMPRESSION: Right adnexal aperistaltic tubular structure containing complex fluid. Findings probably represent pyosalpinx given history of prior fallopian tube infection. Echogenic foci within the wall is an indicator of chronic inflammation. Electronically Signed   By: Kristine Garbe M.D.   On: 03/29/2017 00:29   US Pelvis Complete  Result Date: 03/29/2017 CLINICAL DATA:  31 y/o F; right lower quadrant pain with a palpable lump for 4 days. History of right fallopian tube infection in June. EXAM: TRANSABDOMINAL AND TRANSVAGINAL ULTRASOUND OF PELVIS TECHNIQUE: Both transabdominal and transvaginal ultrasound examinations of the pelvis were performed. Transabdominal technique was performed for global imaging of the pelvis including uterus, ovaries, adnexal regions, and pelvic cul-de-sac. It was necessary to proceed with endovaginal exam following the transabdominal exam to visualize the uterus and bilateral adnexa. COMPARISON:  None FINDINGS: Uterus Measurements: 5.8 x 2.8 x 4.6 cm. No fibroids or other mass visualized. Retroverted. Endometrium  Thickness: 4 mm.  No focal abnormality visualized. Right ovary Measurements: 2.9 x 2.2 x 1.7 cm. Limited visibility of the right ovary, no gross abnormality. Tubular structure within the right adnexa isoechoic to the uterus with multiple small echogenic foci in the wall "Beads on a string" sign. The tubular structure does not demonstrate peristalsis during the ultrasound examination and there is no bowel wall echo signature. Left ovary Measurements: 2.2 x 1.5 x 1.7 cm. 1.2 cm simple ovarian follicle. Other findings Small volume of fluid in the pelvis. IMPRESSION: Right adnexal aperistaltic tubular structure containing complex fluid. Findings probably represent pyosalpinx given history of prior fallopian tube infection. Echogenic foci within the wall is an indicator of chronic inflammation. Electronically Signed   By: Kristine Garbe M.D.   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  Plan: CT abdomen pelvis        IV Cefotetan, po doxycycline,        ESR Brief inpt care, complete outpatient regimen, then consider surgical excision of right adnexal mass.  Rahmon Heigl V 03/29/2017, 6:27 AM

## 2017-03-29 NOTE — Progress Notes (Signed)
Patient informed of radiology result demonstrating a 10 cm right lower quadrant mass of unknown etiology; unclear whether it is arising from the right ovary vs cecum.  Upon review of her chart, patient had a CT in 10/2016 with the following findings: FINDINGS:  NO acute abnormality in the liver, gallbladder, pancreas, spleen, or adrenal glands.  The ileocecal junction is best seen on series 3 image 188. Since prior CT, there has been interval appearance of severe circumferential wall thickening extending from the base of the cecum to the proximal aspect of the right colon, likely but less prominently also involving the terminal ileum, consistent with  acute cecitis and/or malignancy. In addition, there is slightly increased attenuation in this region, which may represent intramural or intraluminal hemorrhagic products. Significant regional adenopathy, measuring up to 1.9 cm in diameter. Trace surrounding fat stranding and minimal free fluid noted. No free air or discrete drainable abscess.  The adjacent urinary bladder shows mild wall thickening, possibly reactive. Status post appendectomy.  NO acute abnormality of the kidneys (no urolithiasis, mass, hydronephrosis etc.).  NO abnormality of the uterus or adnexa by CT criteria.  NO acute osseous lesion.  IMPRESSION: Positive for severe wall thickening of the cecum, consistent with acute cecitis (infectious or inflammatory etiology). However, please note carcinoma can also have this appearance, especially since there is adjacent adenopathy, possibly with intramural  or intraluminal hemorrhage.  General surgery was consulted and they recommended GI consult for possible colonoscopy. GI consulted and plan to see patient later this afternoon. Will call general surgery again pending GI evaluation Patient informed of plan of care

## 2017-03-29 NOTE — Progress Notes (Signed)
Patient very tearful and upset following conversation with Dr. Elly Modena.  Patient scared of dying, keeps saying she has Cancer. States that she has no insurance and is scared that she will receive no treatment if it is cancer.

## 2017-03-29 NOTE — Consult Note (Signed)
[facility] Consult Note   History Meagan [name] MRN # [id number]  Date of Admission: 03/28/2017 Date of Consultation: 03/29/2017 Referring physician: Dr. Glo Herring, Angelyn Punt, MD  Reason for Consultation/Chief Complaint: abdominal/pelvic mass  Subjective  HPI:  I was called earlier today by Dr Elly Modena of gynecology to see this patient for a RLQ mass. Surgery was apparently called first and they advised GI consult based on CT report. See H+P for details of recent history, including events of last few months at [facility] in [place]. CT AP March 2018 revealed RLQ inflammatory or neoplastic process felt likely to be PID and Rx with Abx.   She was there again last month for 3 day admission for same. She came here yesterday for a few days of worsening RLQ pain that is nonradiating. She denies chronic diarrhea, rectal bleeding, UGI symptoms, fever, night sweats or weight loss.  Her appetitie has been good until the last few days when the pain began.  ROS:  All other systems are negative except as noted above in the HPI  Past Medical History Past Medical History:  Diagnosis Date  . Fallopian tube abscess    June 2018 right side  . Ovarian cyst     Past Surgical History Past Surgical History:  Procedure Laterality Date  . APPENDECTOMY  2010    Family History History reviewed. No pertinent family history. No known fam Hx of Crohn's or GI malignancies  Social History Social History   Social History  . Marital status: Married    Spouse name: N/A  . Number of children: N/A  . Years of education: N/A   Social History Main Topics  . Smoking status: Current Every Day Smoker  . Smokeless tobacco: Never Used  . Alcohol use No  . Drug use: No  . Sexual activity: Yes   Other Topics Concern  . None   Social History Narrative  . None    Allergies No Known Allergies  Outpatient Meds Home medications from the H+P and/or nursing med reconciliation  reviewed.  Inpatient med list reviewed  _____________________________________________________________________ Objective   Exam:  Current vital signs  Patient Vitals for the past 8 hrs:  BP Temp Temp src Pulse Resp SpO2  03/29/17 1258 99/64 98.4 F (36.9 C) Oral (!) 104 18 100 %  03/29/17 0801 96/63 (!) 97.5 F (36.4 C) Axillary 93 18 100 %    Intake/Output Summary (Last 24 hours) at 03/29/17 1521 Last data filed at 03/29/17 0437  Gross per 24 hour  Intake             3050 ml  Output                0 ml  Net             3050 ml    Physical Exam:    General: this is a young female patient in no acute distress, poor muscle mass, sitting up in bed, husband at bedside.  Eyes: sclera anicteric, no redness  ENT: oral mucosa moist without lesions, no cervical or supraclavicular lymphadenopathy, fair dentition  CV: RRR without murmur, S1/S2, no JVD,, no peripheral edema  Resp: clear to auscultation bilaterally, normal RR and effort noted  GI: soft, no tenderness, with active bowel sounds. Large hard mass RLQ/suprapubic region.  Skin; warm and dry, no rash or jaundice noted  Neuro: awake, alert and oriented x 3. Normal gross motor function and fluent speech.  Labs:   Recent [employer]  03/29/17 0034 03/29/17 0640  WBC 8.6 6.9  HGB 9.5* 8.3*  HCT 29.9* 27.0*  PLT 533* 418*    Recent Labs Lab 03/29/17 0034  NA 134*  K 3.5  CL 98*  CO2 26  BUN 8  ALBUMIN 3.2*  ALKPHOS 69  ALT 6*  AST 15  GLUCOSE 97   No results for input(s): INR in the last 168 hours.  Radiologic studies:  CTAP this admission personally reviewed. Multiple complex RLQ and pelvic masses with the largest over 10cm and necrotic.  Enlarged regional LN and loculated pelvic ascites.  @ASSESSMENTPLANBEGIN @ Impression:  RLQ mass  I agree that it cannot be determined based on imaging whether this is a chronic inflammatory/infectious process or a malignancy.  I favor the former, particularly  chronic pelvic abscess. Not typical clinical or radiographic presentation for lymphoma. I do not feel that a colonoscopy is the next step in this patient as I do not believe any potential findings on that would change the need for surgical management.  Plan:  Please obtain a formal general surgery consult this weekend.  I am not planning to follow this patient while hospitalized.  Call us as needed.  Thank you for the courtesy of this consult.  Please contact me with any questions or concerns.   Total time 60 minutes, over half spent in record review and discussion with patient/husband and providers.  [name] III Pager: [phone] Mon-Fri 8a-5p [phone] after 5p, weekends, holidays

## 2017-03-29 NOTE — Discharge Summary (Signed)
OB Discharge Summary     Patient Name: Meagan [name] DOB: June 25, 1986 MRN: [id number]  Date of admission: 03/28/2017 Delivering MD: This patient has no babies on file.  Date of discharge: 03/29/2017  Admitting diagnosis: Tubo-ovarian abscess [N70.93] Pain [R52] Thrombocytosis (HCC) [D47.3] Normochromic normocytic anemia [D64.9]  Right pelvic mass     Secondary diagnosis:  Active Problems:   Tubo-ovarian abscess   Right pyosalpinx   Right lower quadrant abdominal mass   Anemia Pelvic mass     Discharge diagnosis: pelvic mass suspicious for GI malignancy                                                                                                Hospital course:  Patient admitted on 7/27 for evaluation and management of RLQ pain. Patient reports onset of the pain a month ago and was treated for [facility] in June. She reports improvement in her symptoms which returned a few days ago. Patient denies fever, nausea, emesis, diarrhea or constipation. 7/27 pelvic ultrasound demonstrates a right ovary measuring 3 cm with an adjacent enlarged tubular mass. 7/28 CT ab/pelvis demonstrated a 10 cm necrotic mass in the right adnexal region suspicious for malignancy. Upon review of the patient's medical records, a CT abd/pelvis from 10/2016 demonstrated abnormalities related to her cecum along with adjacent adenopathy (see in care everywhere). GI and Gen surgery consult were obtained with high concerns for GI malignancy. Plan for transfer to [facility] for colonoscopy with biopsy by GI. Case discussed with hospitalist service Dr. Roel Cluck who accepted the transfer.  Physical exam  Vitals:   03/29/17 0801 03/29/17 1258 03/29/17 1635 03/29/17 2003  BP: 96/63 99/64 (!) 84/61 94/65  Pulse: 93 (!) 104 97 100  Resp: 18 18 18 18   Temp: (!) 97.5 F (36.4 C) 98.4 F (36.9 C) 97.9 F (36.6 C) 99.3 F (37.4 C)  TempSrc: Axillary Oral Oral Oral  SpO2: 100% 100% 98% 100%  Weight:      Height:       General: alert  and cooperative, upset at the idea of poor progronis Abdomen: soft, non distended, palpable right lower quandrant mass with some tenderness in RLQ and suprapubic region DVT Evaluation: No evidence of DVT seen on physical exam. Negative Homan's sign. Labs: Lab Results  Component Value Date   WBC 6.9 03/29/2017   HGB 8.3 (L) 03/29/2017   HCT 27.0 (L) 03/29/2017   MCV 82.3 03/29/2017   PLT 418 (H) 03/29/2017   CMP Latest Ref Rng & Units 03/29/2017  Glucose 65 - 99 mg/dL -  BUN 6 - 20 mg/dL -  Creatinine 0.44 - 1.00 mg/dL 0.57  Sodium 135 - 145 mmol/L -  Potassium 3.5 - 5.1 mmol/L -  Chloride 101 - 111 mmol/L -  CO2 22 - 32 mmol/L -  Calcium 8.9 - 10.3 mg/dL -  Total Protein 6.5 - 8.1 g/dL -  Total Bilirubin 0.3 - 1.2 mg/dL -  Alkaline Phos 38 - 126 U/L -  AST 15 - 41 U/L -  ALT 14 - 54 U/L -    US Transvaginal  Non-ob  Result Date: 03/29/2017 CLINICAL DATA:  31 y/o F; right lower quadrant pain with a palpable lump for 4 days. History of right fallopian tube infection in June. EXAM: TRANSABDOMINAL AND TRANSVAGINAL ULTRASOUND OF PELVIS TECHNIQUE: Both transabdominal and transvaginal ultrasound examinations of the pelvis were performed. Transabdominal technique was performed for global imaging of the pelvis including uterus, ovaries, adnexal regions, and pelvic cul-de-sac. It was necessary to proceed with endovaginal exam following the transabdominal exam to visualize the uterus and bilateral adnexa. COMPARISON:  None FINDINGS: Uterus Measurements: 5.8 x 2.8 x 4.6 cm. No fibroids or other mass visualized. Retroverted. Endometrium Thickness: 4 mm.  No focal abnormality visualized. Right ovary Measurements: 2.9 x 2.2 x 1.7 cm. Limited visibility of the right ovary, no gross abnormality. Tubular structure within the right adnexa isoechoic to the uterus with multiple small echogenic foci in the wall "Beads on a string" sign. The tubular structure does not demonstrate peristalsis during the  ultrasound examination and there is no bowel wall echo signature. Left ovary Measurements: 2.2 x 1.5 x 1.7 cm. 1.2 cm simple ovarian follicle. Other findings Small volume of fluid in the pelvis. IMPRESSION: Right adnexal aperistaltic tubular structure containing complex fluid. Findings probably represent pyosalpinx given history of prior fallopian tube infection. Echogenic foci within the wall is an indicator of chronic inflammation. Electronically Signed   By: Kristine Garbe M.D.   On: 03/29/2017 00:29   US Pelvis Complete  Result Date: 03/29/2017 CLINICAL DATA:  31 y/o F; right lower quadrant pain with a palpable lump for 4 days. History of right fallopian tube infection in June. EXAM: TRANSABDOMINAL AND TRANSVAGINAL ULTRASOUND OF PELVIS TECHNIQUE: Both transabdominal and transvaginal ultrasound examinations of the pelvis were performed. Transabdominal technique was performed for global imaging of the pelvis including uterus, ovaries, adnexal regions, and pelvic cul-de-sac. It was necessary to proceed with endovaginal exam following the transabdominal exam to visualize the uterus and bilateral adnexa. COMPARISON:  None FINDINGS: Uterus Measurements: 5.8 x 2.8 x 4.6 cm. No fibroids or other mass visualized. Retroverted. Endometrium Thickness: 4 mm.  No focal abnormality visualized. Right ovary Measurements: 2.9 x 2.2 x 1.7 cm. Limited visibility of the right ovary, no gross abnormality. Tubular structure within the right adnexa isoechoic to the uterus with multiple small echogenic foci in the wall "Beads on a string" sign. The tubular structure does not demonstrate peristalsis during the ultrasound examination and there is no bowel wall echo signature. Left ovary Measurements: 2.2 x 1.5 x 1.7 cm. 1.2 cm simple ovarian follicle. Other findings Small volume of fluid in the pelvis. IMPRESSION: Right adnexal aperistaltic tubular structure containing complex fluid. Findings probably represent pyosalpinx  given history of prior fallopian tube infection. Echogenic foci within the wall is an indicator of chronic inflammation. Electronically Signed   By: Kristine Garbe M.D.   On: 03/29/2017 00:29   Ct Abdomen Pelvis W Contrast  Result Date: 03/29/2017 CLINICAL DATA:  Abdominal pain for 6 weeks. History of tubo-ovarian abscess. EXAM: CT ABDOMEN AND PELVIS WITH CONTRAST TECHNIQUE: Multidetector CT imaging of the abdomen and pelvis was performed using the standard protocol following bolus administration of intravenous contrast. CONTRAST:  52mL ISOVUE-300 IOPAMIDOL (ISOVUE-300) INJECTION 61% COMPARISON:  None. FINDINGS: Lower chest: No acute abnormality. Hepatobiliary: No focal liver abnormality is seen. No gallstones, gallbladder wall thickening, or biliary dilatation. Pancreas: Unremarkable. No pancreatic ductal dilatation or surrounding inflammatory changes. Spleen: Normal in size without focal abnormality. Adrenals/Urinary Tract: The adrenal glands are normal. Kidneys are unremarkable.  Unremarkable appearance of the urinary bladder. Stomach/Bowel: The stomach is normal. No pathologic dilatation of the large or small bowel loops. Vascular/Lymphatic: Normal appearance of the abdominal aorta. At the level of the aortic bifurcation there is a right common iliac node measuring 1.4 cm, image 38 of series 2. Adjacent enlarged lymph node measures 1.1 cm, image 40 of series 2. Reproductive: The uterus is grossly unremarkable. Other: Within the right iliac fossa there is a large necrotic mass with thick enhancing rim. The mass measures 10.3 cm, image number 32 of series 602. It is unclear whether not this mass is arising from the right adnexa or from the cecum. There is S suture material associated with this mass which is of on certain etiology. Multiple smaller surrounding masses are identified. For example within the right lower quadrant mesenteric there is a 2.6 cm solid mass, image 48 of series 2. Areas of  loculated ascites are identified within the dependent portion of the pelvis. Musculoskeletal: No acute or significant osseous findings. IMPRESSION: 1. There is a large thick walled necrotic mass within the right lower quadrant of the abdomen centered around the right iliac fossa. This measures up to 10 cm in length. Highly suspicious for malignancy. Origin is indeterminate. This may be arising from the cecum or right adnexa. At this time there is no evidence for bowel obstruction or obstructive uropathy. 2. 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 3. Loculated ascites within the pelvis. Electronically Signed   By: Kerby Moors M.D.   On: 03/29/2017 10:18   After visit meds:  Allergies as of 03/29/2017   No Known Allergies     Medication List    TAKE these medications   ibuprofen 800 MG tablet Commonly known as:  ADVIL,MOTRIN Take 800 mg by mouth.       Diet: as per GI in preparation for colonoscopy  Activity: Advance as tolerated.  Disposition:Transfer to [facility] accepting physician Dr. Roel Cluck   03/29/2017 Mora Bellman, MD

## 2017-03-29 NOTE — ED Notes (Signed)
Patient stated that this is the same pain that she had last June 2018 and she was diagnosed with abscess on her fallopian tube that was diagnosed at [facility] and was prescribed with 2 antibiotics (Flagyl & Doxycycline) with good result.  The pain started 4 days ago. RLQ is very tender to touch.

## 2017-03-29 NOTE — Progress Notes (Signed)
Pt refusing lovenox, will place scd's Pt refusing PNV's will d/c Pt dissatisfied with having CT, but will agree to it Pt not willing to accept nicoderm patch, " has to go outside" Will send urine drug screen.

## 2017-03-29 NOTE — Progress Notes (Signed)
Pt gone off unit to get fresh air. Ok per Dr. Glo Herring.

## 2017-03-29 NOTE — Consult Note (Signed)
ACUTE CARE SURGERY CONSULT NOTE:   Reason for Consult:RLQ/pelvic mass Referring Physician: Dr Elly Modena  Meagan [name] is an 31 y.o. female.  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 She reports that back in March she had RLQ pain/lower back pain and went to [place] where a CT was done which showed severe cecal inflammation (from care everywhere- see report below). In reviewing the EDP notes, they favored infection over malignancy and pt was dc to home from ED with cipro/flagyl since otherwise she was nontoxic. Pt reports she felt better after taking the abx. But then had recurrent RLQ pain. She again felt that it was c/w a ruptured cyst which she has had in the past. She went to [facility] in June and had TV u/s which showed large right adnexal mass structure (diffl: bowel next to ovary, abscess, or neoplasm arising from right ovary or bowel).  She was admitted from 6/14-16 for suspected TOA. She was placed on abx and d/c on doxy and flagyl.  In their dc summary they comment pt underwent laparoscopy in 2010 for RLQ pain and was found to have murky fluid, presumed ruptured ovarian cyst with appendectomy performed. Her path report showed unremarkable appendix - no inflammation.    She has occasional nausea with the pain. Denies melena, hematochezia, vomiting, abd distension. Unsure about wt loss. Denies family hx of cancer. BM consistency varies. May be getting a little fuller on smaller amounts of food.   doesnot have periods.   CT A/P without contrast (stone protocol) report March 2018 - [facility]:  IMPRESSION: Positive for severe wall thickening of the cecum, consistent with acute cecitis (infectious or inflammatory etiology). However, please note carcinoma can also have this appearance, especially since there is adjacent adenopathy, possibly with intramural  or intraluminal hemorrhage.    Result Narrative  INDICATION:  Flank pain, stone disease suspected, history of appendectomy reported at 2010.  TECHNIQUE:  Routine CT of the abdomen and pelvis was performed without intravenous or oral contrast per renal calculus evaluation protocol. Evaluation potential of major abdominal viscera and vascular structures is somewhat reduced in the absence of contrast. This  exam is intended to evaluate for presence of renal calculi and does not adequately assess for renal or urothelial malignancies.  FINDINGS:  NO acute abnormality in the liver, gallbladder, pancreas, spleen, or adrenal glands.  The ileocecal junction is best seen on series 3 image 188. Since prior CT, there has been interval appearance of severe circumferential wall thickening extending from the base of the cecum to the proximal aspect of the right colon, likely but less prominently also involving the terminal ileum, consistent with  acute cecitis and/or malignancy. In addition, there is slightly increased attenuation in this region, which may represent intramural or intraluminal hemorrhagic products. Significant regional adenopathy, measuring up to 1.9 cm in diameter. Trace surrounding fat stranding and minimal free fluid noted. No free air or discrete drainable abscess.  The adjacent urinary bladder shows mild wall thickening, possibly reactive. Status post appendectomy.  NO acute abnormality of the kidneys (no urolithiasis, mass, hydronephrosis etc.).  NO abnormality of the uterus or adnexa by CT criteria.  NO acute osseous lesion.    Past Medical History:  Diagnosis Date  . Fallopian tube abscess    June 2018 right side  . Ovarian cyst     Past Surgical History:  Procedure Laterality Date  .  APPENDECTOMY  2010    History reviewed. No pertinent family history.  Social History:  reports that she has been smoking.  She has never used smokeless tobacco. She reports that she does not drink alcohol or use  drugs.  Allergies: No Known Allergies  Medications: I have reviewed the patient's current medications.  Results for orders placed or performed during the hospital encounter of 03/28/17 (from the past 48 hour(s))  Urinalysis, Routine w reflex microscopic     Status: Abnormal   Collection Time: 03/28/17 11:10 PM  Result Value Ref Range   Color, Urine YELLOW YELLOW   APPearance CLOUDY (A) CLEAR   Specific Gravity, Urine 1.008 1.005 - 1.030   pH 7.5 5.0 - 8.0   Glucose, UA NEGATIVE NEGATIVE mg/dL   Hgb urine dipstick NEGATIVE NEGATIVE   Bilirubin Urine NEGATIVE NEGATIVE   Ketones, ur NEGATIVE NEGATIVE mg/dL   Protein, ur NEGATIVE NEGATIVE mg/dL   Nitrite NEGATIVE NEGATIVE   Leukocytes, UA SMALL (A) NEGATIVE  Pregnancy, urine     Status: None   Collection Time: 03/28/17 11:10 PM  Result Value Ref Range   Preg Test, Ur NEGATIVE NEGATIVE    Comment:        THE SENSITIVITY OF THIS METHODOLOGY IS >20 mIU/mL.   Urinalysis, Microscopic (reflex)     Status: Abnormal   Collection Time: 03/28/17 11:10 PM  Result Value Ref Range   RBC / HPF NONE SEEN 0 - 5 RBC/hpf   WBC, UA 0-5 0 - 5 WBC/hpf   Bacteria, UA RARE (A) NONE SEEN   Squamous Epithelial / LPF 6-30 (A) NONE SEEN  Comprehensive metabolic panel     Status: Abnormal   Collection Time: 03/29/17 12:34 AM  Result Value Ref Range   Sodium 134 (L) 135 - 145 mmol/L   Potassium 3.5 3.5 - 5.1 mmol/L   Chloride 98 (L) 101 - 111 mmol/L   CO2 26 22 - 32 mmol/L   Glucose, Bld 97 65 - 99 mg/dL   BUN 8 6 - 20 mg/dL   Creatinine, Ser 0.75 0.44 - 1.00 mg/dL   Calcium 8.6 (L) 8.9 - 10.3 mg/dL   Total Protein 8.1 6.5 - 8.1 g/dL   Albumin 3.2 (L) 3.5 - 5.0 g/dL   AST 15 15 - 41 U/L   ALT 6 (L) 14 - 54 U/L   Alkaline Phosphatase 69 38 - 126 U/L   Total Bilirubin 0.3 0.3 - 1.2 mg/dL   GFR calc non Af Amer >60 >60 mL/min   GFR calc Af Amer >60 >60 mL/min    Comment: (NOTE) The eGFR has been calculated using the CKD EPI equation. This  calculation has not been validated in all clinical situations. eGFR's persistently <60 mL/min signify possible Chronic Kidney Disease.    Anion gap 10 5 - 15  CBC with Differential     Status: Abnormal   Collection Time: 03/29/17 12:34 AM  Result Value Ref Range   WBC 8.6 4.0 - 10.5 K/uL   RBC 3.66 (L) 3.87 - 5.11 MIL/uL   Hemoglobin 9.5 (L) 12.0 - 15.0 g/dL   HCT 29.9 (L) 36.0 - 46.0 %   MCV 81.7 78.0 - 100.0 fL   MCH 26.0 26.0 - 34.0 pg   MCHC 31.8 30.0 - 36.0 g/dL   RDW 21.4 (H) 11.5 - 15.5 %   Platelets 533 (H) 150 - 400 K/uL   Neutrophils Relative % 62 %   Lymphocytes Relative 21 %  Monocytes Relative 8 %   Eosinophils Relative 9 %   Basophils Relative 0 %   Neutro Abs 5.3 1.7 - 7.7 K/uL   Lymphs Abs 1.8 0.7 - 4.0 K/uL   Monocytes Absolute 0.7 0.1 - 1.0 K/uL   Eosinophils Absolute 0.8 (H) 0.0 - 0.7 K/uL   Basophils Absolute 0.0 0.0 - 0.1 K/uL   RBC Morphology TEARDROP CELLS     Comment: ELLIPTOCYTES   WBC Morphology WHITE COUNT CONFIRMED ON SMEAR    Smear Review PLATELET COUNT CONFIRMED BY SMEAR   CBC     Status: Abnormal   Collection Time: 03/29/17  6:40 AM  Result Value Ref Range   WBC 6.9 4.0 - 10.5 K/uL   RBC 3.28 (L) 3.87 - 5.11 MIL/uL   Hemoglobin 8.3 (L) 12.0 - 15.0 g/dL   HCT 27.0 (L) 36.0 - 46.0 %   MCV 82.3 78.0 - 100.0 fL   MCH 25.3 (L) 26.0 - 34.0 pg   MCHC 30.7 30.0 - 36.0 g/dL   RDW 21.5 (H) 11.5 - 15.5 %   Platelets 418 (H) 150 - 400 K/uL  Creatinine, serum     Status: None   Collection Time: 03/29/17  6:40 AM  Result Value Ref Range   Creatinine, Ser 0.57 0.44 - 1.00 mg/dL   GFR calc non Af Amer >60 >60 mL/min   GFR calc Af Amer >60 >60 mL/min    Comment: (NOTE) The eGFR has been calculated using the CKD EPI equation. This calculation has not been validated in all clinical situations. eGFR's persistently <60 mL/min signify possible Chronic Kidney Disease.   Sedimentation rate     Status: Abnormal   Collection Time: 03/29/17  6:40 AM    Result Value Ref Range   Sed Rate 72 (H) 0 - 22 mm/hr    Comment: Performed at [facility] [address], [place], [place] [address]  Type and screen     Status: None   Collection Time: 03/29/17  6:40 AM  Result Value Ref Range   ABO/RH(D) O POS    Antibody Screen NEG    Sample Expiration 04/01/2017   ABO/Rh     Status: None   Collection Time: 03/29/17  6:40 AM  Result Value Ref Range   ABO/RH(D) O POS   Rapid urine drug screen (hospital performed)     Status: Abnormal   Collection Time: 03/29/17  7:55 AM  Result Value Ref Range   Opiates POSITIVE (A) NONE DETECTED   Cocaine NONE DETECTED NONE DETECTED   Benzodiazepines NONE DETECTED NONE DETECTED   Amphetamines NONE DETECTED NONE DETECTED   Tetrahydrocannabinol NONE DETECTED NONE DETECTED   Barbiturates NONE DETECTED NONE DETECTED    Comment:        DRUG SCREEN FOR MEDICAL PURPOSES ONLY.  IF CONFIRMATION IS NEEDED FOR ANY PURPOSE, NOTIFY LAB WITHIN 5 DAYS.        LOWEST DETECTABLE LIMITS FOR URINE DRUG SCREEN Drug Class       Cutoff (ng/mL) Amphetamine      1000 Barbiturate      200 Benzodiazepine   010 Tricyclics       932 Opiates          300 Cocaine          300 THC              50     US Transvaginal Non-ob  Result Date: 03/29/2017 CLINICAL DATA:  30 y/o F; right  lower quadrant pain with a palpable lump for 4 days. History of right fallopian tube infection in June. EXAM: TRANSABDOMINAL AND TRANSVAGINAL ULTRASOUND OF PELVIS TECHNIQUE: Both transabdominal and transvaginal ultrasound examinations of the pelvis were performed. Transabdominal technique was performed for global imaging of the pelvis including uterus, ovaries, adnexal regions, and pelvic cul-de-sac. It was necessary to proceed with endovaginal exam following the transabdominal exam to visualize the uterus and bilateral adnexa. COMPARISON:  None FINDINGS: Uterus Measurements: 5.8 x 2.8 x 4.6 cm. No fibroids or other mass visualized. Retroverted.  Endometrium Thickness: 4 mm.  No focal abnormality visualized. Right ovary Measurements: 2.9 x 2.2 x 1.7 cm. Limited visibility of the right ovary, no gross abnormality. Tubular structure within the right adnexa isoechoic to the uterus with multiple small echogenic foci in the wall "Beads on a string" sign. The tubular structure does not demonstrate peristalsis during the ultrasound examination and there is no bowel wall echo signature. Left ovary Measurements: 2.2 x 1.5 x 1.7 cm. 1.2 cm simple ovarian follicle. Other findings Small volume of fluid in the pelvis. IMPRESSION: Right adnexal aperistaltic tubular structure containing complex fluid. Findings probably represent pyosalpinx given history of prior fallopian tube infection. Echogenic foci within the wall is an indicator of chronic inflammation. Electronically Signed   By: Kristine Garbe M.D.   On: 03/29/2017 00:29   US Pelvis Complete  Result Date: 03/29/2017 CLINICAL DATA:  31 y/o F; right lower quadrant pain with a palpable lump for 4 days. History of right fallopian tube infection in June. EXAM: TRANSABDOMINAL AND TRANSVAGINAL ULTRASOUND OF PELVIS TECHNIQUE: Both transabdominal and transvaginal ultrasound examinations of the pelvis were performed. Transabdominal technique was performed for global imaging of the pelvis including uterus, ovaries, adnexal regions, and pelvic cul-de-sac. It was necessary to proceed with endovaginal exam following the transabdominal exam to visualize the uterus and bilateral adnexa. COMPARISON:  None FINDINGS: Uterus Measurements: 5.8 x 2.8 x 4.6 cm. No fibroids or other mass visualized. Retroverted. Endometrium Thickness: 4 mm.  No focal abnormality visualized. Right ovary Measurements: 2.9 x 2.2 x 1.7 cm. Limited visibility of the right ovary, no gross abnormality. Tubular structure within the right adnexa isoechoic to the uterus with multiple small echogenic foci in the wall "Beads on a string" sign. The  tubular structure does not demonstrate peristalsis during the ultrasound examination and there is no bowel wall echo signature. Left ovary Measurements: 2.2 x 1.5 x 1.7 cm. 1.2 cm simple ovarian follicle. Other findings Small volume of fluid in the pelvis. IMPRESSION: Right adnexal aperistaltic tubular structure containing complex fluid. Findings probably represent pyosalpinx given history of prior fallopian tube infection. Echogenic foci within the wall is an indicator of chronic inflammation. Electronically Signed   By: Kristine Garbe M.D.   On: 03/29/2017 00:29   Ct Abdomen Pelvis W Contrast  Result Date: 03/29/2017 CLINICAL DATA:  Abdominal pain for 6 weeks. History of tubo-ovarian abscess. EXAM: CT ABDOMEN AND PELVIS WITH CONTRAST TECHNIQUE: Multidetector CT imaging of the abdomen and pelvis was performed using the standard protocol following bolus administration of intravenous contrast. CONTRAST:  25m ISOVUE-300 IOPAMIDOL (ISOVUE-300) INJECTION 61% COMPARISON:  None. FINDINGS: Lower chest: No acute abnormality. Hepatobiliary: No focal liver abnormality is seen. No gallstones, gallbladder wall thickening, or biliary dilatation. Pancreas: Unremarkable. No pancreatic ductal dilatation or surrounding inflammatory changes. Spleen: Normal in size without focal abnormality. Adrenals/Urinary Tract: The adrenal glands are normal. Kidneys are unremarkable. Unremarkable appearance of the urinary bladder. Stomach/Bowel: The stomach is normal. No  pathologic dilatation of the large or small bowel loops. Vascular/Lymphatic: Normal appearance of the abdominal aorta. At the level of the aortic bifurcation there is a right common iliac node measuring 1.4 cm, image 38 of series 2. Adjacent enlarged lymph node measures 1.1 cm, image 40 of series 2. Reproductive: The uterus is grossly unremarkable. Other: Within the right iliac fossa there is a large necrotic mass with thick enhancing rim. The mass measures 10.3 cm,  image number 32 of series 602. It is unclear whether not this mass is arising from the right adnexa or from the cecum. There is S suture material associated with this mass which is of on certain etiology. Multiple smaller surrounding masses are identified. For example within the right lower quadrant mesenteric there is a 2.6 cm solid mass, image 48 of series 2. Areas of loculated ascites are identified within the dependent portion of the pelvis. Musculoskeletal: No acute or significant osseous findings. IMPRESSION: 1. There is a large thick walled necrotic mass within the right lower quadrant of the abdomen centered around the right iliac fossa. This measures up to 10 cm in length. Highly suspicious for malignancy. Origin is indeterminate. This may be arising from the cecum or right adnexa. At this time there is no evidence for bowel obstruction or obstructive uropathy. 2. 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 3. Loculated ascites within the pelvis. Electronically Signed   By: Kerby Moors M.D.   On: 03/29/2017 10:18    Review of Systems  Constitutional: Negative for weight loss.  HENT: Negative for nosebleeds.   Eyes: Negative for blurred vision.  Respiratory: Negative for shortness of breath.   Cardiovascular: Negative for chest pain, palpitations, orthopnea and PND.       Denies DOE  Gastrointestinal: Positive for abdominal pain and nausea. Negative for blood in stool and melena.  Genitourinary: Negative for dysuria and hematuria.  Musculoskeletal: Negative.   Skin: Negative for itching and rash.  Neurological: Negative for dizziness, focal weakness, seizures, loss of consciousness and headaches.       Denies TIAs, amaurosis fugax  Endo/Heme/Allergies: Does not bruise/bleed easily.  Psychiatric/Behavioral: The patient is not nervous/anxious.   All other systems reviewed and are negative.  Blood pressure (!) 84/61, pulse 97, temperature 97.9 F (36.6  C), temperature source Oral, resp. rate 18, height _0  (1.575 m), weight 45.4 kg (100 lb), SpO2 98 %. Physical Exam  Vitals reviewed. Constitutional: She is oriented to person, place, and time. She appears well-developed and well-nourished.  Non-toxic appearance. No distress.  Thin woman in NAD  HENT:  Head: Normocephalic and atraumatic.  Right Ear: External ear normal.  Left Ear: External ear normal.  Eyes: Conjunctivae are normal. No scleral icterus.  Neck: Normal range of motion. Neck supple. No tracheal deviation present. No thyromegaly present.  Cardiovascular: Normal rate and normal heart sounds.   Respiratory: Effort normal and breath sounds normal. No stridor. No respiratory distress. She has no wheezes.  GI: Soft. She exhibits no ascites. There is tenderness in the right lower quadrant and suprapubic area. There is no rigidity, no rebound and no guarding.    Very mild distension, soft, palpable suprapubic to RLQ mass; no rebound/guarding/peritonitis.   Musculoskeletal: She exhibits no edema or tenderness.  Lymphadenopathy:    She has no cervical adenopathy.       Right: No inguinal adenopathy present.       Left: No inguinal adenopathy present.  Neurological: She is alert and  oriented to person, place, and time. She exhibits normal muscle tone.  Skin: Skin is warm and dry. No rash noted. She is not diaphoretic. No erythema. No pallor.  Psychiatric: She has a normal mood and affect. Her behavior is normal. Judgment and thought content normal.    Assessment/Plan: RLQ pain RLQ necrotic mass Regional adenopathy Anemia Mild protein calorie malnutrition  I do not believe this right lower quadrant mass represents a chronic abscess. I'm very concerned that this mass represents a malignancy. Although we do not have the actual imaging from [facility] their radiological report reveals normal-appearing adnexa in March but an abnormal appearing cecal wall. She has never had a  leukocytosis as far as I can tell in reviewing the outside medical records. Moreover she has anemia. In addition on the CT scan for this admission there is a surgical clip within the mass. This is most likely from her appendectomy. In addition the size of her lymph nodes in the vicinity are more concerning for a neoplastic process than an infectious process. Also it would be unusual to see solid masses in an abscess.   However an infectious process cannot be ruled out based on imaging but I think it is very unlikely unfortunately.. With that said I would not recommend operative exploration because if this is in fact a cancer it would be frought with complications.   I rediscussed the case with Dr. Loletha Carrow and shared my concerns.   I think the best approach for diagnosis would be a colonoscopy.  Discussed with Dr Elly Modena.   I had an extensive 30 min discussion with the patient and her husband along with the nurse present regarding her recent history, CT scans and labs. I shared my concern that this was something potentially more complex than chronic abscess and that going straight to the OR would be ill advised without addl information. Patient had several questions regarding timeline of additional workup. She wanted a colonoscopy done this evening or tomorrow morning. I advised her that the earliest they could be done would be Monday because she would have to undergo a bowel prep.  Recommend transfer to [facility] to medical service. Please alert Dr. Loletha Carrow on Sunday when patient has arrived to [facility] to discuss colonoscopy and bowel prep with patient We will follow along Patient may have clear liquids. Offered her protein shakes but she declined. She states that she does not like soups. No role for antibiotics  Total time 90 min  Leighton Ruff. Redmond Pulling, MD, FACS General, Bariatric, & Minimally Invasive Surgery [facility] Surgery, [place]  [facility] M 03/29/2017, 7:40 PM

## 2017-03-29 NOTE — ED Notes (Signed)
Alert, NAD, calm, interactive, resps e/u, speaking in clear complete sentences, no dyspnea noted, skin W&D, VSS, using phone games, mentions pain and nausea, (denies: sob, dizziness or visual changes). Family at [facility].

## 2017-03-29 NOTE — Progress Notes (Signed)
Pt arrived to unit via [facility] in stable condition. Dr. Glo Herring notified and is on way to assess pt.

## 2017-03-29 NOTE — ED Notes (Signed)
No changes, [facility] here.

## 2017-03-29 NOTE — Plan of Care (Signed)
40 yow RLQ pain have been treated for TOA in the past but continues to have a mass. Gi and general surgery ( Dr. Loura Back) [facility] Dr. Loletha Carrow  Have seen her suspect  mass originating from the cecum, afebrile Plan to move to [facility] For Diagnostic Colonoscopy with biopsy.  Accepted to MedSurg bed  Regenia Erck 9:19 PM

## 2017-03-29 NOTE — Progress Notes (Signed)
Patient for transfer to [facility]  Rm 1344.  Report given via phone to [facility] RN @ 2150  2200 [facility] at bedside.  Transfer report given to [facility]

## 2017-03-30 LAB — BASIC METABOLIC PANEL
ANION GAP: 7 (ref 5–15)
BUN: <5 mg/dL — ABNORMAL LOW (ref 6–20)
CHLORIDE: 107 mmol/L (ref 101–111)
CO2: 26 mmol/L (ref 22–32)
Calcium: 8.5 mg/dL — ABNORMAL LOW (ref 8.9–10.3)
Creatinine, Ser: 0.58 mg/dL (ref 0.44–1.00)
GFR calc non Af Amer: >60 mL/min (ref >60)
Glucose, Bld: 88 mg/dL (ref 65–99)
POTASSIUM: 4.1 mmol/L (ref 3.5–5.1)
SODIUM: 140 mmol/L (ref 135–145)

## 2017-03-30 LAB — IRON AND TIBC
IRON: 8 ug/dL — AB (ref 28–170)
SATURATION RATIOS: 3 % — AB (ref 10.4–31.8)
TIBC: 263 ug/dL (ref 250–450)
UIBC: 255 ug/dL

## 2017-03-30 LAB — RETICULOCYTES
RBC.: 3.03 MIL/uL — ABNORMAL LOW (ref 3.87–5.11)
Retic Count, Absolute: 33.3 10*3/uL (ref 19.0–186.0)
Retic Ct Pct: 1.1 % (ref 0.4–3.1)

## 2017-03-30 LAB — CBC
HCT: 24.5 % — ABNORMAL LOW (ref 36.0–46.0)
HEMOGLOBIN: 7.7 g/dL — AB (ref 12.0–15.0)
MCH: 25.6 pg — ABNORMAL LOW (ref 26.0–34.0)
MCHC: 31.4 g/dL (ref 30.0–36.0)
MCV: 81.4 fL (ref 78.0–100.0)
Platelets: 403 10*3/uL — ABNORMAL HIGH (ref 150–400)
RBC: 3.01 MIL/uL — AB (ref 3.87–5.11)
RDW: 21 % — ABNORMAL HIGH (ref 11.5–15.5)
WBC: 7.7 10*3/uL (ref 4.0–10.5)

## 2017-03-30 LAB — FERRITIN: Ferritin: 29 ng/mL (ref 11–307)

## 2017-03-30 LAB — VITAMIN B12: VITAMIN B 12: 272 pg/mL (ref 180–914)

## 2017-03-30 LAB — FOLATE: FOLATE: 13.3 ng/mL (ref >5.9)

## 2017-03-30 MED ORDER — PEG-KCL-NACL-NASULF-NA ASC-C 100 G PO SOLR
0.5000 | Freq: Once | ORAL | Status: AC
  Administered 2017-03-31: 100 g via ORAL
  Filled 2017-03-30: qty 1

## 2017-03-30 MED ORDER — PEG-KCL-NACL-NASULF-NA ASC-C 100 G PO SOLR: 1.0000 | Freq: Once | ORAL | Status: DC

## 2017-03-30 MED ORDER — SODIUM CHLORIDE 0.9 % IV SOLN: INTRAVENOUS | Status: DC

## 2017-03-30 MED ORDER — PEG-KCL-NACL-NASULF-NA ASC-C 100 G PO SOLR
0.5000 | Freq: Once | ORAL | Status: AC
  Administered 2017-03-30: 100 g via ORAL
  Filled 2017-03-30: qty 1

## 2017-03-30 NOTE — Progress Notes (Signed)
Patient ID: Shakala Marlatt, female   DOB: [birth year], 31 y.o.   MRN: [id number]   Acute Care Surgery Service Progress Note:    Chief Complaint/Subjective: Has several questions about long term prog if this is in fact cancer.   Objective: Vital signs in last 24 hours: Temp:  [97.9 F (36.6 C)-99.3 F (37.4 C)] 98 F (36.7 C) (07/29 0453) Pulse Rate:  [97-107] 97 (07/29 0453) Resp:  [16-18] 16 (07/29 0453) BP: (84-99)/(61-67) 94/67 (07/29 0453) SpO2:  [98 %-100 %] 100 % (07/29 0453) Weight:  [45.3 kg (99 lb 14.4 oz)] 45.3 kg (99 lb 14.4 oz) (07/28 2238) Last BM Date: 03/28/17  Intake/Output from previous day: No intake/output data recorded. Intake/Output this shift: No intake/output data recorded.  Exam deferred.  Slightly flat affect Neuro: alert, nonfocal  Lab Results: CBC   Recent Labs  03/29/17 0640 03/30/17 0421  WBC 6.9 7.7  HGB 8.3* 7.7*  HCT 27.0* 24.5*  PLT 418* 403*   BMET  Recent Labs  03/29/17 0034 03/29/17 0640 03/30/17 0421  NA 134*  --  140  K 3.5  --  4.1  CL 98*  --  107  CO2 26  --  26  GLUCOSE 97  --  88  BUN 8  --  <5*  CREATININE 0.75 0.57 0.58  CALCIUM 8.6*  --  8.5*   LFT Hepatic Function Latest Ref Rng & Units 03/29/2017  Total Protein 6.5 - 8.1 g/dL 8.1  Albumin 3.5 - 5.0 g/dL 3.2(L)  AST 15 - 41 U/L 15  ALT 14 - 54 U/L 6(L)  Alk Phosphatase 38 - 126 U/L 69  Total Bilirubin 0.3 - 1.2 mg/dL 0.3   PT/INR No results for input(s): LABPROT, INR in the last 72 hours. ABG No results for input(s): PHART, HCO3 in the last 72 hours.  Invalid input(s): PCO2, PO2  Studies/Results:  Anti-infectives: Anti-infectives    Start     Dose/Rate Route Frequency Ordered Stop   03/29/17 1300  cefoTEtan (CEFOTAN) 1 g in dextrose 5 % 50 mL IVPB  Status:  Discontinued     1 g 100 mL/hr over 30 Minutes Intravenous Every 12 hours 03/29/17 0626 03/29/17 2238   03/29/17 1000  doxycycline (VIBRA-TABS) tablet 100 mg  Status:  Discontinued     100  mg Oral Every 12 hours 03/29/17 0620 03/29/17 2238   03/29/17 0700  cefoTEtan (CEFOTAN) 1 g in dextrose 5 % 50 mL IVPB  Status:  Discontinued     1 g 100 mL/hr over 30 Minutes Intravenous Every 12 hours 03/29/17 0620 03/29/17 0626   03/29/17 0245  cefOXitin (MEFOXIN) 2 g in dextrose 5 % 50 mL IVPB     2 g 100 mL/hr over 30 Minutes Intravenous  Once 03/29/17 0236 03/29/17 0339   03/29/17 0245  doxycycline (VIBRAMYCIN) 100 mg in dextrose 5 % 250 mL IVPB     100 mg 125 mL/hr over 120 Minutes Intravenous  Once 03/29/17 0236 03/29/17 0540   03/29/17 0242  doxycycline (VIBRAMYCIN) 100 MG injection    Comments:  [name]   : cabinet override      03/29/17 0242 03/29/17 1444   03/29/17 0230  cefoTEtan (CEFOTAN) 2 g in dextrose 5 % 50 mL IVPB  Status:  Discontinued     2 g 100 mL/hr over 30 Minutes Intravenous Every 12 hours 03/29/17 0229 03/29/17 0236      Medications: Scheduled Meds: . nicotine  21 mg Transdermal Daily  Continuous Infusions: . sodium chloride Stopped (03/29/17 1343)   PRN Meds:.acetaminophen, docusate sodium, HYDROmorphone, ondansetron (ZOFRAN) IV  Assessment/Plan: Patient Active Problem List   Diagnosis Date Noted  . Tubo-ovarian abscess 03/29/2017  . Right pyosalpinx 03/29/2017  . Right lower quadrant abdominal mass 03/29/2017  . Anemia 03/29/2017   RLQ pain Large RLQ necrotic mass Loculated pelvic ascites Regional adenopathy Anemia Mild protein calorie malnutrition  Mainly a brief social visit today. Answered her questions as best as I could.  Again, still very concerned that this is a malignancy when look at overall picture Still believe next best step is to proceed with colonoscopy.  Discussed with [facility] this am again.  In holding pattern until after cscopy.    LOS: 1 day    Leighton Ruff. Redmond Pulling, MD, FACS General, Bariatric, & Minimally Invasive Surgery [phone] [facility]

## 2017-03-30 NOTE — Progress Notes (Signed)
PROGRESS NOTE    Meagan [name]  CBJ:[id number] DOB: Oct 18, 1985 DOA: 03/28/2017 PCP: Patient, No Pcp Per  Brief Narrative: This is a 31 year old white female transferred from [facility] yesterday to [facility], for further evaluation of Right pelvic mass, initially suspected to be a chronic complex tubo-ovarian abscess, and admitted by GYN yesterday but subsequently transferred here for further surgical and GI evaluation. Per [facility] notes, pt underwent laparoscopy in 2010 for RLQ pain and was found to have murky fluid, presumed ruptured ovarian cyst with appendectomy performed. Patient's had ongoing issues with right lower quadrant pain since March of this year, was seen at no violent mesh CT scan showed severe cecal inflammation discharged home on antibiotics improved after this and then had recurrent pain, subsequently seen at [facility] in June where she had a transvaginal ultrasound which noted a large right adnexal mass, admitted there, treated with IV antibiotics.   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 -Check anemia panel is asymptomatic from her anemia, hence suggesting chronicity   DVT prophylaxis: SCDs Code Status: Full code Family Communication: Husband at bedside Disposition Plan: Home pending above workup  Consultants:   Gastroenterology  General surgery   Subjective: Very flat affect, does not make eye contact, admits to some dull discomfort in the right pelvis  Objective: Vitals:   03/29/17 1635 03/29/17 2003 03/29/17 2238 03/30/17 0453  BP: (!) 84/61 94/65 98/66  94/67  Pulse: 97 100 (!) 107 97  Resp: 18 18 18 16   Temp: 97.9 F (36.6 C) 99.3 F (37.4  C) 98 F (36.7 C) 98 F (36.7 C)  TempSrc: Oral Oral Oral Oral  SpO2: 98% 100% 100% 100%  Weight:   45.3 kg (99 lb 14.4 oz)   Height:   5' 2"  (1.575 m)    No intake or output data in the 24 hours ending 03/30/17 1059 Filed Weights   03/28/17 2309 03/29/17 2238  Weight: 45.4 kg (100 lb) 45.3 kg (99 lb 14.4 oz)    Examination:  General exam: Thinly built white female sitting in bed, no distress Respiratory system: Clear to auscultation Cardiovascular system: S1 & S2 heard, RRR. No JVD, murmurs Gastrointestinal system: Abdomen is soft, scaphoid, mild right lower quadrant tenderness elicited, no rigidity, no rebound, bowel sounds present  Central nervous system: Alert and oriented. No focal neurological deficits. Extremities: Symmetric 5 x 5 power. Skin: No rashes, lesions or ulcers Psychiatry: Flat affect    Data Reviewed:   CBC:  Recent Labs Lab 03/29/17 0034 03/29/17 0640 03/30/17 0421  WBC 8.6 6.9 7.7  NEUTROABS 5.3  --   --   HGB 9.5* 8.3* 7.7*  HCT 29.9* 27.0* 24.5*  MCV 81.7 82.3 81.4  PLT 533* 418* 160*   Basic Metabolic Panel:  Recent Labs Lab 03/29/17 0034 03/29/17 0640 03/30/17 0421  NA 134*  --  140  K 3.5  --  4.1  CL 98*  --  107  CO2 26  --  26  GLUCOSE 97  --  88  BUN 8  --  <5*  CREATININE 0.75 0.57 0.58  CALCIUM 8.6*  --  8.5*   GFR: Estimated Creatinine Clearance: 72.2 mL/min (by C-G formula based on SCr of 0.58 mg/dL). Liver  Function Tests:  Recent Labs Lab 03/29/17 0034  AST 15  ALT 6*  ALKPHOS 69  BILITOT 0.3  PROT 8.1  ALBUMIN 3.2*   No results for input(s): LIPASE, AMYLASE in the last 168 hours. No results for input(s): AMMONIA in the last 168 hours. Coagulation Profile: No results for input(s): INR, PROTIME in the last 168 hours. Cardiac Enzymes: No results for input(s): CKTOTAL, CKMB, CKMBINDEX, TROPONINI in the last 168 hours. BNP (last 3 results) No results for input(s): PROBNP in the last 8760 hours. HbA1C: No  results for input(s): HGBA1C in the last 72 hours. CBG: No results for input(s): GLUCAP in the last 168 hours. Lipid Profile: No results for input(s): CHOL, HDL, LDLCALC, TRIG, CHOLHDL, LDLDIRECT in the last 72 hours. Thyroid Function Tests: No results for input(s): TSH, T4TOTAL, FREET4, T3FREE, THYROIDAB in the last 72 hours. Anemia Panel: No results for input(s): VITAMINB12, FOLATE, FERRITIN, TIBC, IRON, RETICCTPCT in the last 72 hours. Urine analysis:    Component Value Date/Time   COLORURINE YELLOW 03/28/2017 2310   APPEARANCEUR CLOUDY (A) 03/28/2017 2310   LABSPEC 1.008 03/28/2017 2310   PHURINE 7.5 03/28/2017 2310   GLUCOSEU NEGATIVE 03/28/2017 2310   HGBUR NEGATIVE 03/28/2017 2310   BILIRUBINUR NEGATIVE 03/28/2017 2310   KETONESUR NEGATIVE 03/28/2017 2310   PROTEINUR NEGATIVE 03/28/2017 2310   NITRITE NEGATIVE 03/28/2017 2310   LEUKOCYTESUR SMALL (A) 03/28/2017 2310   Sepsis Labs: @LABRCNTIP (procalcitonin:4,lacticidven:4)  )No results found for this or any previous visit (from the past 240 hour(s)).       Radiology Studies: US Transvaginal Non-ob  Result Date: 03/29/2017 CLINICAL DATA:  31 y/o F; right lower quadrant pain with a palpable lump for 4 days. History of right fallopian tube infection in June. EXAM: TRANSABDOMINAL AND TRANSVAGINAL ULTRASOUND OF PELVIS TECHNIQUE: Both transabdominal and transvaginal ultrasound examinations of the pelvis were performed. Transabdominal technique was performed for global imaging of the pelvis including uterus, ovaries, adnexal regions, and pelvic cul-de-sac. It was necessary to proceed with endovaginal exam following the transabdominal exam to visualize the uterus and bilateral adnexa. COMPARISON:  None FINDINGS: Uterus Measurements: 5.8 x 2.8 x 4.6 cm. No fibroids or other mass visualized. Retroverted. Endometrium Thickness: 4 mm.  No focal abnormality visualized. Right ovary Measurements: 2.9 x 2.2 x 1.7 cm. Limited visibility of  the right ovary, no gross abnormality. Tubular structure within the right adnexa isoechoic to the uterus with multiple small echogenic foci in the wall "Beads on a string" sign. The tubular structure does not demonstrate peristalsis during the ultrasound examination and there is no bowel wall echo signature. Left ovary Measurements: 2.2 x 1.5 x 1.7 cm. 1.2 cm simple ovarian follicle. Other findings Small volume of fluid in the pelvis. IMPRESSION: Right adnexal aperistaltic tubular structure containing complex fluid. Findings probably represent pyosalpinx given history of prior fallopian tube infection. Echogenic foci within the wall is an indicator of chronic inflammation. Electronically Signed   By: Kristine Garbe M.D.   On: 03/29/2017 00:29   US Pelvis Complete  Result Date: 03/29/2017 CLINICAL DATA:  31 y/o F; right lower quadrant pain with a palpable lump for 4 days. History of right fallopian tube infection in June. EXAM: TRANSABDOMINAL AND TRANSVAGINAL ULTRASOUND OF PELVIS TECHNIQUE: Both transabdominal and transvaginal ultrasound examinations of the pelvis were performed. Transabdominal technique was performed for global imaging of the pelvis including uterus, ovaries, adnexal regions, and pelvic cul-de-sac. It was necessary to proceed with endovaginal exam following the transabdominal exam to visualize the  uterus and bilateral adnexa. COMPARISON:  None FINDINGS: Uterus Measurements: 5.8 x 2.8 x 4.6 cm. No fibroids or other mass visualized. Retroverted. Endometrium Thickness: 4 mm.  No focal abnormality visualized. Right ovary Measurements: 2.9 x 2.2 x 1.7 cm. Limited visibility of the right ovary, no gross abnormality. Tubular structure within the right adnexa isoechoic to the uterus with multiple small echogenic foci in the wall "Beads on a string" sign. The tubular structure does not demonstrate peristalsis during the ultrasound examination and there is no bowel wall echo signature. Left  ovary Measurements: 2.2 x 1.5 x 1.7 cm. 1.2 cm simple ovarian follicle. Other findings Small volume of fluid in the pelvis. IMPRESSION: Right adnexal aperistaltic tubular structure containing complex fluid. Findings probably represent pyosalpinx given history of prior fallopian tube infection. Echogenic foci within the wall is an indicator of chronic inflammation. Electronically Signed   By: Kristine Garbe M.D.   On: 03/29/2017 00:29   Ct Abdomen Pelvis W Contrast  Result Date: 03/29/2017 CLINICAL DATA:  Abdominal pain for 6 weeks. History of tubo-ovarian abscess. EXAM: CT ABDOMEN AND PELVIS WITH CONTRAST TECHNIQUE: Multidetector CT imaging of the abdomen and pelvis was performed using the standard protocol following bolus administration of intravenous contrast. CONTRAST:  60m ISOVUE-300 IOPAMIDOL (ISOVUE-300) INJECTION 61% COMPARISON:  None. FINDINGS: Lower chest: No acute abnormality. Hepatobiliary: No focal liver abnormality is seen. No gallstones, gallbladder wall thickening, or biliary dilatation. Pancreas: Unremarkable. No pancreatic ductal dilatation or surrounding inflammatory changes. Spleen: Normal in size without focal abnormality. Adrenals/Urinary Tract: The adrenal glands are normal. Kidneys are unremarkable. Unremarkable appearance of the urinary bladder. Stomach/Bowel: The stomach is normal. No pathologic dilatation of the large or small bowel loops. Vascular/Lymphatic: Normal appearance of the abdominal aorta. At the level of the aortic bifurcation there is a right common iliac node measuring 1.4 cm, image 38 of series 2. Adjacent enlarged lymph node measures 1.1 cm, image 40 of series 2. Reproductive: The uterus is grossly unremarkable. Other: Within the right iliac fossa there is a large necrotic mass with thick enhancing rim. The mass measures 10.3 cm, image number 32 of series 602. It is unclear whether not this mass is arising from the right adnexa or from the cecum. There is S  suture material associated with this mass which is of on certain etiology. Multiple smaller surrounding masses are identified. For example within the right lower quadrant mesenteric there is a 2.6 cm solid mass, image 48 of series 2. Areas of loculated ascites are identified within the dependent portion of the pelvis. Musculoskeletal: No acute or significant osseous findings. IMPRESSION: 1. There is a large thick walled necrotic mass within the right lower quadrant of the abdomen centered around the right iliac fossa. This measures up to 10 cm in length. Highly suspicious for malignancy. Origin is indeterminate. This may be arising from the cecum or right adnexa. At this time there is no evidence for bowel obstruction or obstructive uropathy. 2. 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 3. Loculated ascites within the pelvis. Electronically Signed   By: TKerby MoorsM.D.   On: 03/29/2017 10:18        Scheduled Meds: . nicotine  21 mg Transdermal Daily  . peg 3350 powder  0.5 kit Oral Once   And  . [START ON 03/31/2017] peg 3350 powder  0.5 kit Oral Once   Continuous Infusions: . sodium chloride Stopped (03/29/17 1343)  . sodium chloride  LOS: 1 day    Time spent: 67mn    PDomenic Polite MD [facility] Pager [phone] If 7PM-7AM, please contact night-coverage [email or web address] Password [email or web address] 03/30/2017, 10:59 AM

## 2017-03-30 NOTE — Progress Notes (Signed)
[facility] Progress Note  Chief Complaint: Right lower quadrant mass  Subjective  History:  Meagan [name] complains of the same dull RLQ pain for which she presented. She is visibly upset this morning with poor eye contact and a defensive demeanor. Her husband is at the bedside for my entire visit. I had a long discussion with Dr. Greer Pickerel of surgery. He is particularly concerned about the likelihood of malignancy, especially colon cancer. As such, he feels strongly about this patient having a colonoscopy for diagnosis because it would help determine whether a challenging multidisciplinary surgery would be the best option for her.   ROS: Cardiovascular:  no chest pain Respiratory: no dyspnea  Objective:  Med list reviewed  Vital signs in last 24 hrs: Vitals:   03/29/17 2238 03/30/17 0453  BP: 98/66 94/67  Pulse: (!) 107 97  Resp: 18 16  Temp: 98 F (36.7 C) 98 F (36.7 C)    Physical Exam   HEENT: sclera anicteric, oral mucosa moist without lesions.  Thin as before  Neck: supple, no thyromegaly, JVD or lymphadenopathy  Cardiac: RRR without murmurs, S1S2 heard, no peripheral edema  Pulm: clear to auscultation bilaterally, normal RR and effort noted  Abdomen: soft, soft tenderness, with active bowel sounds.palpable mass as before  Skin; warm and dry, no jaundice or rash  Recent Labs:   Recent Labs Lab 03/29/17 0034 03/29/17 0640 03/30/17 0421  WBC 8.6 6.9 7.7  HGB 9.5* 8.3* 7.7*  HCT 29.9* 27.0* 24.5*  PLT 533* 418* 403*    Recent Labs Lab 03/29/17 0034 03/30/17 0421  NA 134* 140  K 3.5 4.1  CL 98* 107  CO2 26 26  BUN 8 <5*  ALBUMIN 3.2*  --   ALKPHOS 69  --   ALT 6*  --   AST 15  --   GLUCOSE 97 88   No results for input(s): INR in the last 168 hours.  @ASSESSMENTPLANBEGIN @ Assessment: Right lower quadrant mass  Taking into account our surgical consultant's perspective, I have reconsidered and am agreeable to arranging a colonoscopy for  tomorrow. Meagan [name] is clearly very worried and frightened, and thus having difficulty processing everything that is happening.  That is causing a highly guarded and difficult affect that was a great challenge to navigate.  I explained the rationale for a colonoscopy, and also described the procedure in detail. I explained the risks including an increased risk of perforation since we have then uncertain and possibly inflammatory process arising from or adjacent to the right colon. She was ultimately agreeable.  The benefits and risks of the planned procedure were described in detail with the patient or (when appropriate) their health care proxy.  Risks were outlined as including, but not limited to, bleeding, infection, perforation, adverse medication reaction leading to cardiac or pulmonary decompensation, or pancreatitis (if ERCP).  The limitation of incomplete mucosal visualization was also discussed.  No guarantees or warranties were given.  Plan: Clear liquid diet today Colon preparation split dose tonight/tomorrow AM. Colonoscopy with Dr.Nandigam tomorrow - the patient is aware of that. Discussed with Dr. Redmond Pulling.  Total time 45 minutes, over half spent in discussion with patient and providers.   Meagan [name] III Pager [phone] Mon-Fri 8a-5p [phone] after 5p, weekends, holidays

## 2017-03-30 NOTE — Progress Notes (Signed)
CSW was consulted to speak to patient regarding new diagnosis, as patient was upset about uncertain prognosis. CSW talked at length with patient and patient's husband, Meagan [name], at bedside. Patient was somewhat tearful at beginning of conversation but spirits lifted by end up conversation.   Patient talked about feeling uncertain about diagnosis that will result from upcoming colonoscopy and is upset that she is sick and having to rely on others while she is in the hospital. CSW and patient talked at length around themes of blame and responsibility, as well as strength and resilience as it relates to her and her illness.   Patient has significant social support from husband and other family members. Patient indicated she wants to find out what is wrong so it can be treated and she can return home to her children, who are being cared for by other family members while she is admitted. CSW reflected on patient's determination and strength and validated patient's feelings of frustration and sadness. CSW offered hope for recovery.  CSW signing off for now, will be available if other social issues arise while patient is admitted.   Meagan [name], [place]

## 2017-03-31 ENCOUNTER — Inpatient Hospital Stay (HOSPITAL_COMMUNITY): Payer: Medicaid Other | Admitting: Certified Registered Nurse Anesthetist

## 2017-03-31 ENCOUNTER — Encounter (HOSPITAL_COMMUNITY): Payer: Self-pay

## 2017-03-31 ENCOUNTER — Encounter (HOSPITAL_COMMUNITY)
Admission: EM | Disposition: A | Discharge status: Home / Self Care | Payer: Self-pay | Source: Home / Self Care | Attending: Internal Medicine

## 2017-03-31 DIAGNOSIS — C180 Malignant neoplasm of cecum: Principal | ICD-10-CM

## 2017-03-31 DIAGNOSIS — D509 Iron deficiency anemia, unspecified: Secondary | ICD-10-CM

## 2017-03-31 HISTORY — PX: COLONOSCOPY: SHX5424

## 2017-03-31 LAB — BASIC METABOLIC PANEL
Anion gap: 9 (ref 5–15)
CALCIUM: 8.4 mg/dL — AB (ref 8.9–10.3)
CHLORIDE: 106 mmol/L (ref 101–111)
CO2: 22 mmol/L (ref 22–32)
CREATININE: 0.56 mg/dL (ref 0.44–1.00)
Glucose, Bld: 91 mg/dL (ref 65–99)
Potassium: 3.3 mmol/L — ABNORMAL LOW (ref 3.5–5.1)
SODIUM: 137 mmol/L (ref 135–145)

## 2017-03-31 LAB — CBC
HCT: 26.1 % — ABNORMAL LOW (ref 36.0–46.0)
HEMOGLOBIN: 8.1 g/dL — AB (ref 12.0–15.0)
MCH: 25.2 pg — ABNORMAL LOW (ref 26.0–34.0)
MCHC: 31 g/dL (ref 30.0–36.0)
MCV: 81.3 fL (ref 78.0–100.0)
PLATELETS: 443 10*3/uL — AB (ref 150–400)
RBC: 3.21 MIL/uL — ABNORMAL LOW (ref 3.87–5.11)
RDW: 20.7 % — AB (ref 11.5–15.5)
WBC: 7.9 10*3/uL (ref 4.0–10.5)

## 2017-03-31 SURGERY — COLONOSCOPY
Anesthesia: Monitor Anesthesia Care

## 2017-03-31 MED ORDER — SODIUM CHLORIDE 0.9 % IV SOLN
25.0000 mg | Freq: Once | INTRAVENOUS | Status: AC
  Administered 2017-03-31: 25 mg via INTRAVENOUS
  Filled 2017-03-31: qty 0.5

## 2017-03-31 MED ORDER — ONDANSETRON HCL 4 MG/2ML IJ SOLN
INTRAMUSCULAR | Status: AC
  Filled 2017-03-31: qty 2

## 2017-03-31 MED ORDER — PROPOFOL 500 MG/50ML IV EMUL
INTRAVENOUS | Status: DC | PRN
  Administered 2017-03-31: 200 ug/kg/min via INTRAVENOUS

## 2017-03-31 MED ORDER — PROPOFOL 10 MG/ML IV BOLUS
INTRAVENOUS | Status: AC
  Filled 2017-03-31: qty 40

## 2017-03-31 MED ORDER — ONDANSETRON HCL 4 MG/2ML IJ SOLN
INTRAMUSCULAR | Status: DC | PRN
  Administered 2017-03-31: 4 mg via INTRAVENOUS

## 2017-03-31 MED ORDER — SODIUM CHLORIDE 0.9 % IV SOLN
250.0000 mg | Freq: Once | INTRAVENOUS | Status: AC
  Administered 2017-03-31: 250 mg via INTRAVENOUS
  Filled 2017-03-31 (×2): qty 5

## 2017-03-31 MED ORDER — PROPOFOL 10 MG/ML IV BOLUS
INTRAVENOUS | Status: DC | PRN
  Administered 2017-03-31 (×5): 20 mg via INTRAVENOUS

## 2017-03-31 NOTE — Anesthesia Postprocedure Evaluation (Signed)
Anesthesia Post Note  Patient: Meagan [name]  Procedure(s) Performed: Procedure(s) (LRB): COLONOSCOPY (N/A)     Patient location during evaluation: PACU Anesthesia Type: MAC Level of consciousness: awake Pain management: pain level controlled Vital Signs Assessment: post-procedure vital signs reviewed and stable Respiratory status: patient connected to nasal cannula oxygen Cardiovascular status: stable Anesthetic complications: no    Last Vitals:  Vitals:   03/31/17 1445 03/31/17 1450  BP: (!) 87/62 (!) 91/26  Pulse: 97 95  Resp: 14 (!) 26  Temp:      Last Pain:  Vitals:   03/31/17 1404  TempSrc:   PainSc: 0-No pain                 Candus Braud

## 2017-03-31 NOTE — Anesthesia Preprocedure Evaluation (Addendum)
Anesthesia Evaluation  Patient identified by MRN, date of birth, ID band Patient awake    Reviewed: Allergy & Precautions, NPO status , Patient's Chart, lab work & pertinent test results  Airway Mallampati: II  TM Distance: >3 FB     Dental   Pulmonary Current Smoker,    breath sounds clear to auscultation       Cardiovascular negative cardio ROS   Rhythm:Regular Rate:Normal     Neuro/Psych    GI/Hepatic Neg liver ROS, History noted. CG   Endo/Other  negative endocrine ROS  Renal/GU negative Renal ROS     Musculoskeletal   Abdominal   Peds  Hematology  (+) anemia ,   Anesthesia Other Findings   Reproductive/Obstetrics                             Anesthesia Physical Anesthesia Plan  ASA: II  Anesthesia Plan: MAC   Post-op Pain Management:    Induction: Intravenous  PONV Risk Score and Plan: 1 and Ondansetron and Propofol infusion  Airway Management Planned: Simple Face Mask  Additional Equipment:   Intra-op Plan:   Post-operative Plan:   Informed Consent: I have reviewed the patients History and Physical, chart, labs and discussed the procedure including the risks, benefits and alternatives for the proposed anesthesia with the patient or authorized representative who has indicated his/her understanding and acceptance.   Dental advisory given  Plan Discussed with: CRNA and Anesthesiologist  Anesthesia Plan Comments:         Anesthesia Quick Evaluation

## 2017-03-31 NOTE — Op Note (Signed)
[facility] Patient Name: Meagan [name] Procedure Date: 03/31/2017 MRN: [id number] Attending MD: Mauri Pole , MD Date of Birth: [birth year] CSN: [id number] Age: 31 Admit Type: Inpatient Procedure:                Colonoscopy Indications:              Abdominal pain in the right lower quadrant,                            Abnormal CT of the GI tract Providers:                Mauri Pole, MD, Laverta Baltimore RN, RN,                            Elspeth Cho Tech., Technician, Virgia Land, CRNA Referring MD:              Medicines:                Monitored Anesthesia Care Complications:            No immediate complications. Estimated Blood Loss:     Estimated blood loss was minimal. Procedure:                Pre-Anesthesia Assessment:                           - Prior to the procedure, a History and Physical                            was performed, and patient medications and                            allergies were reviewed. The patient's tolerance of                            previous anesthesia was also reviewed. The risks                            and benefits of the procedure and the sedation                            options and risks were discussed with the patient.                            All questions were answered, and informed consent                            was obtained. Anticoagulants: The patient has taken                            Lovenox (enoxaparin). It was decided not to                            withhold this medication prior to procedure. ASA  Grade Assessment: III - A patient with severe                            systemic disease. After reviewing the risks and                            benefits, the patient was deemed in satisfactory                            condition to undergo the procedure.                           After obtaining informed consent, the colonoscope       was passed under direct vision. Throughout the                            procedure, the patient's blood pressure, pulse, and                            oxygen saturations were monitored continuously. The                            EC-[id number] ([id number]) scope was introduced through                            the anus and advanced to the the cecum, identified                            by transillumination. The colonoscopy was performed                            without difficulty. The patient tolerated the                            procedure well. The quality of the bowel                            preparation was good. The rectum was photographed. Scope In: 2:06:39 PM Scope Out: 2:26:43 PM Scope Withdrawal Time: 0 hours 12 minutes 22 seconds  Total Procedure Duration: 0 hours 20 minutes 4 seconds  Findings:      The perianal and digital rectal examinations were normal.      Inflitrative mass with extensive ulceration, firm, friable mucosa in the       cecum distorting anatomy, couldnt identify ileocecal valve or       appendiceal orifice. The rectum, sigmoid colon, descending colon,       transverse colon and ascending colon appeared normal. Biopsies were       taken from the mass lesion with a cold forceps for histology. Impression:               - The rectum, sigmoid colon, descending colon,                            transverse colon and ascending colon are normal.  Biopsied. Moderate Sedation:      N/A- Per Anesthesia Care Recommendation:           - Patient has a contact number available for                            emergencies. The signs and symptoms of potential                            delayed complications were discussed with the                            patient. Return to normal activities tomorrow.                            Written discharge instructions were provided to the                            patient.                           -  Resume previous diet.                           - Continue present medications.                           - Await pathology results.                           - Repeat colonoscopy date to be determined after                            pending pathology results are reviewed for                            surveillance based on pathology results. Procedure Code(s):        --- Professional ---                           [phone], Colonoscopy, flexible; with biopsy, single                            or multiple Diagnosis Code(s):        --- Professional ---                           R10.31, Right lower quadrant pain                           R93.3, Abnormal findings on diagnostic imaging of                            other parts of digestive tract CPT copyright 2016 [employer]. All rights reserved. The codes documented in this report are preliminary and upon coder review may  be revised to meet current compliance requirements. Mauri Pole, MD 03/31/2017 2:36:37  PM This report has been signed electronically. Number of Addenda: 0

## 2017-03-31 NOTE — Progress Notes (Addendum)
PROGRESS NOTE    Meagan [name]  YQI:[id number] DOB: [birth year] DOA: 03/28/2017 PCP: Patient, No Pcp Per  Brief Narrative: This is a 31 year old white female transferred from [facility] yesterday to [facility], for further evaluation of Right pelvic mass, initially suspected to be a chronic complex tubo-ovarian abscess, and admitted by GYN yesterday but subsequently transferred here for further surgical and GI evaluation. Per [facility] notes, pt underwent laparoscopy in 2010 for RLQ pain and was found to have murky fluid, presumed ruptured ovarian cyst with appendectomy performed. Patient's had ongoing issues with right lower quadrant pain since March of this year, was seen at no violent mesh CT scan showed severe cecal inflammation discharged home on antibiotics improved after this and then had recurrent pain, subsequently seen at [facility] in June where she had a transvaginal ultrasound which noted a large right adnexal mass, admitted there, treated with IV antibiotics.   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 -Appreciate GI and surgical evaluation -Colonoscopy today at 1pm -Clinically no indications for antibiotics at this time -Supportive care    Anemia -due to chronic disease and severe Iron defi -ammenorrheic for 18 months due to Nexplenon -give IV iron after test dose now  DVT prophylaxis: SCDs Code Status: Full code Family Communication: Husband at bedside Disposition Plan: Home pending above workup  Consultants:   Gastroenterology  General surgery   Subjective: Feels ok, mild discomfort in RLQ, no vomiting  Objective: Vitals:   03/30/17 0453 03/30/17 1354 03/30/17 2202 03/31/17 0547  BP: 94/67 99/67 (!) 94/59 (!) 102/58  Pulse: 97 (!) 106 96 98  Resp: 16 16 18 16   Temp: 98 F (36.7 C) 98.4 F (36.9 C) 97.7 F (36.5 C) 97.6 F (36.4 C)  TempSrc: Oral Oral Oral Oral  SpO2:  100% 100% 100% 100%  Weight:      Height:        Intake/Output Summary (Last 24 hours) at 03/31/17 1154 Last data filed at 03/30/17 1750  Gross per 24 hour  Intake              240 ml  Output                0 ml  Net              240 ml   Filed Weights   03/28/17 2309 03/29/17 2238  Weight: 45.4 kg (100 lb) 45.3 kg (99 lb 14.4 oz)    Examination:  Gen: Awake, Alert, Oriented X 3, thinly built, more communicative today HEENT: PERRLA, Neck supple, no JVD Lungs: Good air movement bilaterally, CTAB CVS: RRR,No Gallops,Rubs or new Murmurs Abd: soft, Non tender, non distended, BS present Extremities: No Cyanosis, Clubbing or edema Skin: no new rashes  Data Reviewed:   CBC:  Recent Labs Lab 03/29/17 0034 03/29/17 0640 03/30/17 0421 03/31/17 0414  WBC 8.6 6.9 7.7 7.9  NEUTROABS 5.3  --   --   --   HGB 9.5* 8.3* 7.7* 8.1*  HCT 29.9* 27.0* 24.5* 26.1*  MCV 81.7 82.3 81.4 81.3  PLT 533* 418* 403* 387*   Basic Metabolic Panel:  Recent Labs Lab 03/29/17 0034 03/29/17 0640 03/30/17 0421 03/31/17 0414  NA 134*  --  140 137  K 3.5  --  4.1 3.3*  CL 98*  --  107 106  CO2 26  --  26 22  GLUCOSE 97  --  88  91  BUN 8  --  <5* <5*  CREATININE 0.75 0.57 0.58 0.56  CALCIUM 8.6*  --  8.5* 8.4*   GFR: Estimated Creatinine Clearance: 72.2 mL/min (by C-G formula based on SCr of 0.56 mg/dL). Liver Function Tests:  Recent Labs Lab 03/29/17 0034  AST 15  ALT 6*  ALKPHOS 69  BILITOT 0.3  PROT 8.1  ALBUMIN 3.2*   No results for input(s): LIPASE, AMYLASE in the last 168 hours. No results for input(s): AMMONIA in the last 168 hours. Coagulation Profile: No results for input(s): INR, PROTIME in the last 168 hours. Cardiac Enzymes: No results for input(s): CKTOTAL, CKMB, CKMBINDEX, TROPONINI in the last 168 hours. BNP (last 3 results) No results for input(s): PROBNP in the last 8760 hours. HbA1C: No results for input(s): HGBA1C in the last 72 hours. CBG: No results  for input(s): GLUCAP in the last 168 hours. Lipid Profile: No results for input(s): CHOL, HDL, LDLCALC, TRIG, CHOLHDL, LDLDIRECT in the last 72 hours. Thyroid Function Tests: No results for input(s): TSH, T4TOTAL, FREET4, T3FREE, THYROIDAB in the last 72 hours. Anemia Panel:  Recent Labs  03/30/17 0430 03/30/17 1115  VITAMINB12  --  272  FOLATE  --  13.3  FERRITIN  --  29  TIBC  --  263  IRON  --  8*  RETICCTPCT 1.1  --    Urine analysis:    Component Value Date/Time   COLORURINE YELLOW 03/28/2017 2310   APPEARANCEUR CLOUDY (A) 03/28/2017 2310   LABSPEC 1.008 03/28/2017 2310   PHURINE 7.5 03/28/2017 2310   GLUCOSEU NEGATIVE 03/28/2017 2310   HGBUR NEGATIVE 03/28/2017 2310   BILIRUBINUR NEGATIVE 03/28/2017 2310   KETONESUR NEGATIVE 03/28/2017 2310   PROTEINUR NEGATIVE 03/28/2017 2310   NITRITE NEGATIVE 03/28/2017 2310   LEUKOCYTESUR SMALL (A) 03/28/2017 2310   Sepsis Labs: @LABRCNTIP (procalcitonin:4,lacticidven:4)  )No results found for this or any previous visit (from the past 240 hour(s)).       Radiology Studies: No results found.      Scheduled Meds: . nicotine  21 mg Transdermal Daily   Continuous Infusions: . sodium chloride 75 mL/hr at 03/31/17 0352  . sodium chloride    . iron dextran (INFED/DEXFERRUM) infusion       LOS: 2 days    Time spent: 22min    Domenic Polite, MD [facility] Pager [phone]  If 7PM-7AM, please contact night-coverage [email or web address] Password [email or web address] 03/31/2017, 11:54 AM

## 2017-03-31 NOTE — H&P (Signed)
[facility] History and Physical   Primary Care Physician:  Patient, No Pcp Per   Reason for Procedure:   Abnormal imaging, R adnexal mass extending to cecum and ascending colon Plan:    Colonoscopy with possible intervention    HPI: Meagan [name] is a 31 y.o. female with RLQ abd pain and abnormal imaging concerning for malignant lesion of R adnexa or colon.   Past Medical History:  Diagnosis Date  . Fallopian tube abscess    June 2018 right side  . Ovarian cyst     Past Surgical History:  Procedure Laterality Date  . APPENDECTOMY  2010    Prior to Admission medications   Medication Sig Start Date End Date Taking? Authorizing Provider  ibuprofen (ADVIL,MOTRIN) 800 MG tablet Take 800 mg by mouth. 02/15/17  Yes [provider]    Current Facility-Administered Medications  Medication Dose Route Frequency Provider Last Rate Last Dose  . 0.9 %  sodium chloride infusion   Intravenous Continuous Jonnie Kind, MD 75 mL/hr at 03/31/17 [phone]    . 0.9 %  sodium chloride infusion   Intravenous Continuous Danis, Estill Cotta III, MD      . Doug Sou Hold] acetaminophen (TYLENOL) tablet 650 mg  650 mg Oral Q6H PRN Etta Quill, DO      . [MAR Hold] docusate sodium (COLACE) capsule 100 mg  100 mg Oral BID PRN Jonnie Kind, MD      . Doug Sou Hold] HYDROmorphone (DILAUDID) tablet 1 mg  1 mg Oral Q3H PRN Toy Baker, MD   1 mg at 03/30/17 2204  . [MAR Hold] iron dextran complex (INFED) 250 mg in sodium chloride 0.9 % 100 mL IVPB  250 mg Intravenous Once Domenic Polite, MD      . Doug Sou Hold] nicotine (NICODERM CQ - dosed in mg/24 hours) patch 21 mg  21 mg Transdermal Daily Jonnie Kind, MD   21 mg at 03/31/17 1039  . [MAR Hold] ondansetron (ZOFRAN) injection 4 mg  4 mg Intravenous Q6H PRN Etta Quill, DO        Allergies as of 03/28/2017  . (No Known Allergies)    Family History  Problem Relation Age of Onset  . Leukemia Father        "Just got  diagnosed, but not the 'serious type'" (sounds like CLL if I had to guess)    Social History   Social History  . Marital status: Married    Spouse name: N/A  . Number of children: N/A  . Years of education: N/A   Occupational History  . Not on file.   Social History Main Topics  . Smoking status: Current Every Day Smoker  . Smokeless tobacco: Never Used  . Alcohol use No  . Drug use: No  . Sexual activity: Yes   Other Topics Concern  . Not on file   Social History Narrative  . No narrative on file    Review of Systems:  All other review of systems negative except as mentioned in the HPI.  Physical Exam: Vital signs in last 24 hours: Temp:  [97.6 F (36.4 C)-98.4 F (36.9 C)] 98.2 F (36.8 C) (07/30 1300) Pulse Rate:  [96-106] 102 (07/30 1300) Resp:  [16-19] 19 (07/30 1300) BP: (94-106)/(58-67) 106/67 (07/30 1300) SpO2:  [100 %] 100 % (07/30 1300) Weight:  [99 lb (44.9 kg)] 99 lb (44.9 kg) (07/30 1300) Last BM Date: 03/30/17 General:   Alert,  Well-developed, well-nourished, pleasant  and cooperative in NAD Lungs:  Clear throughout to auscultation.   Heart:  Regular rate and rhythm; no murmurs, clicks, rubs,  or gallops. Abdomen:  Soft, tender and mild distended. Normal bowel sounds.   Neuro/Psych:  Alert and cooperative. Normal mood and affect. A and O x 3   @K .Denzil Magnuson, MD [phone] Mon-Fri 8a-5p [phone] after 5p, weekends, holidays 03/31/2017 1:07 PM@

## 2017-03-31 NOTE — Transfer of Care (Signed)
Immediate Anesthesia Transfer of Care Note  Patient: Meagan [name]  Procedure(s) Performed: Procedure(s): COLONOSCOPY (N/A)  Patient Location: PACU  Anesthesia Type:MAC  Level of Consciousness:  sedated, patient cooperative and responds to stimulation  Airway & Oxygen Therapy:Patient Spontanous Breathing and Patient connected to face mask oxgen  Post-op Assessment:  Report given to PACU RN and Post -op Vital signs reviewed and stable  Post vital signs:  Reviewed and stable  Last Vitals:  Vitals:   03/31/17 0547 03/31/17 1300  BP: (!) 102/58 106/67  Pulse: 98 (!) 102  Resp: 16 19  Temp: 36.4 C 16.1 C    Complications: No apparent anesthesia complications

## 2017-04-01 ENCOUNTER — Encounter (HOSPITAL_COMMUNITY): Payer: Self-pay | Admitting: Gastroenterology

## 2017-04-01 DIAGNOSIS — D500 Iron deficiency anemia secondary to blood loss (chronic): Secondary | ICD-10-CM

## 2017-04-01 MED ORDER — LORAZEPAM 1 MG PO TABS
1.0000 mg | ORAL_TABLET | Freq: Once | ORAL | Status: AC
  Administered 2017-04-01: 1 mg via ORAL
  Filled 2017-04-01: qty 1

## 2017-04-01 MED ORDER — IRON DEXTRAN 50 MG/ML IJ SOLN
750.0000 mg | Freq: Once | INTRAMUSCULAR | Status: DC
  Filled 2017-04-01: qty 15

## 2017-04-01 MED ORDER — SODIUM CHLORIDE 0.9 % IV SOLN
750.0000 mg | Freq: Once | INTRAVENOUS | Status: AC
  Administered 2017-04-01: 750 mg via INTRAVENOUS
  Filled 2017-04-01: qty 15

## 2017-04-01 NOTE — Care Management Note (Signed)
Case Management Note  Patient Details  Name: Meagan [name] MRN: [id number] Date of Birth: Oct 08, 1985  Subjective/Objective:  31 yo admitted with right lower quadrant abdominal mass.                  Action/Plan: From home with spouse.   Expected Discharge Date:  03/29/17               Expected Discharge Plan:  Home/Self Care  In-House Referral:     Discharge planning Services  CM Consult  Post Acute Care Choice:    Choice offered to:     DME Arranged:    DME Agency:     HH Arranged:    HH Agency:     Status of Service:  In process, will continue to follow  If discussed at Long Length of Stay Meetings, dates discussed:    Additional CommentsLynnell Catalan, RN 04/01/2017, 2:50 PM  [phone]

## 2017-04-01 NOTE — Progress Notes (Addendum)
[place] Patient Consult   Referring MD: Macarena Langseth 31 y.o.  11-17-1985    Reason for Referral: Colon mass   HPI: She reports the onset of low back pain in March of this year. She was evaluated in [facility] [place] and a CT on 11/19/2016 revealed circumferential wall thickening in the cecum extending to the proximal right colon. Regional adenopathy was noted. She was anemic. She was treated for acute cecitis and primary care/GI follow-up was recommended.  The right lower back pain resolved, but she developed abdominal pain. She was admitted to [facility] 02/13/2017 and was treated for a suspected tubo-ovarian abscess. She was treated with doxycycline and metronidazole.  [facility] presented to [facility] emergency room with right lower quadrant pain and a palpable mass on 03/29/2017. She was admitted to [facility] transvaginal ultrasound revealed a right adnexal tubular structure with complex fluid. A CT of the abdomen/pelvis revealed a right common iliac node measuring 1.4 cm at the aortic bifurcation with an adjacent 1.1 cm node. A large necrotic mass was noted in the right iliac fossa measuring 10.3 cm. Multiple smaller surrounding masses were noted. Areas of loculated ascites are noted within the pelvis. It was unclear whether the mass represented a primary colon lesion or originates from the right adnexa.  Dr. Redmond Pulling was consulted. He recommended transfer to [facility] for a colonoscopy.  She was taken to a colonoscopy by Dr. Silverio Decamp on 03/31/2017. A mass with extensive ulceration was found at the cecum. The remainder of the colon appeared normal. A biopsy was obtained. 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    Past Medical History:  Diagnosis Date  . Fallopian tube abscess    June 2018 right side  . G3 P3      Past Surgical History:  Procedure Laterality Date  . APPENDECTOMY  2010     Medications: Reviewed  Allergies: No Known Allergies  Family history: Her father had a myeloid malignancy. No other family history of cancer  Social History:   She lives with her husband and children in [place]. She is a Agricultural engineer. She smokes cigarettes. No alcohol use. No transfusion history. No risk factor for HIV or hepatitis.  ROS:   Positives include: Low abdominal pain  A complete ROS was otherwise negative.  Physical Exam:  Blood pressure (!) 88/66, pulse (!) 52, temperature 98.7 F (37.1 C), temperature source Oral, resp. rate 15, height 5\' 2"  (1.575 m), weight 99 lb (44.9 kg), SpO2 90 %.  HEENT: Oral cavity without visible mass, neck without mass Lungs: Clear bilaterally Cardiac: Regular rate and rhythm Abdomen: Soft, no hepatosplenomegaly, no apparent ascites, mass at the low abdomen/suprapubic area  Vascular: No leg edema Lymph nodes: Pea-sized left posterior cervical node. No other cervical, supraclavicular, axillary, or inguinal nodes Neurologic: Alert and oriented, the motor exam appears intact in the upper and lower extremities Skin: Multiple tattoos, no rash Musculoskeletal: No spine tenderness   LAB:  CBC  Lab Results  Component Value Date   WBC 7.9 03/31/2017   HGB 8.1 (L) 03/31/2017   HCT 26.1 (L) 03/31/2017   MCV 81.3 03/31/2017   PLT 443 (H) 03/31/2017   NEUTROABS 5.3 03/29/2017        CMP     Component Value Date/Time   NA 137 03/31/2017 0414   K 3.3 (L) 03/31/2017 0414   CL 106 03/31/2017 0414   CO2 22 03/31/2017  0414   GLUCOSE 91 03/31/2017 0414   BUN <5 (L) 03/31/2017 0414   CREATININE 0.56 03/31/2017 0414   CALCIUM 8.4 (L) 03/31/2017 0414   PROT 8.1 03/29/2017 0034   ALBUMIN 3.2 (L) 03/29/2017 0034   AST 15 03/29/2017 0034   ALT 6 (L) 03/29/2017 0034   ALKPHOS 69 03/29/2017 0034   BILITOT 0.3 03/29/2017 0034   GFRNONAA >60 03/31/2017 0414   GFRAA >60 03/31/2017 0414      Imaging:  CT images from  03/29/2017-reviewed   Assessment/Plan:   1. Right abdominal/pelvic mass  Colonoscopy 03/31/2017 confirmed a cecum mass  2.   Anemia-likely iron deficiency anemia secondary to #1  3.   G3 P3   Disposition:   Meagan [name] presents with abdominal pain and a palpable low abdominal mass. A CT reveals a right pelvic mass and a colonoscopy 03/31/2017 confirms a cecum mass.  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 She appears to have locally advanced disease with adenopathy adjacent to the cecum mass and there may be distant adenopathy in the retroperitoneum.  I discussed the probable diagnosis and treatment options with Meagan [name] and her husband. She will be evaluated by the surgical service and her case will be presented at the GI tumor conference on 04/02/2017.  I will follow her in the hospital and arrange for an outpatient appointment at the Cancer center. We will direct molecular testing if the final pathology confirms colon cancer.  Recommendations: 1. Check CEA 2. Staging chest CT versus PET scan pending GI tumor conference discussion  Donneta Romberg, MD  04/01/2017, 4:42 PM

## 2017-04-01 NOTE — Progress Notes (Signed)
Discussed results of biopsies of necrotic cecal mass with patient and her husband, poorly differentiated adeno ca.  I requested pathology, if can differentiate between Colon ca vs ovarian/fallopian, pending stains  Available for any questions.   Damaris Hippo, MD [phone] Mon-Fri 8a-5p [phone] after 5p, weekends, holidays

## 2017-04-01 NOTE — Progress Notes (Signed)
1 Day Post-Op    CC:  RUQ tenderness and mass  Subjective: Sleepy, no pain or discomfort, anxious awaiting pathology  Objective: Vital signs in last 24 hours: Temp:  [98.1 F (36.7 C)-98.7 F (37.1 C)] 98.7 F (37.1 C) (07/31 0558) Pulse Rate:  [52-106] 52 (07/31 0558) Resp:  [14-26] 15 (07/31 0558) BP: (87-106)/(26-67) 88/66 (07/31 0558) SpO2:  [90 %-100 %] 90 % (07/31 0558) Weight:  [44.9 kg (99 lb)] 44.9 kg (99 lb) (07/30 1300) Last BM Date: 03/30/17  Nothing PO recorded Urine x 2  BM x 0 Afebrile, VSS K+ 3.3H/H stable   Intake/Output from previous day: 07/30 0701 - 07/31 0700 In: 1336.3 [I.V.:1231.3; IV Piggyback:105] Out: -  Intake/Output this shift: No intake/output data recorded.  General appearance: alert, cooperative and no distress Resp: clear to auscultation bilaterally GI: soft, non-tender; bowel sounds normal; no masses,  no organomegaly  Lab Results:   Recent Labs  03/30/17 0421 03/31/17 0414  WBC 7.7 7.9  HGB 7.7* 8.1*  HCT 24.5* 26.1*  PLT 403* 443*    BMET  Recent Labs  03/30/17 0421 03/31/17 0414  NA 140 137  K 4.1 3.3*  CL 107 106  CO2 26 22  GLUCOSE 88 91  BUN <5* <5*  CREATININE 0.58 0.56  CALCIUM 8.5* 8.4*   PT/INR No results for input(s): LABPROT, INR in the last 72 hours.   Recent Labs Lab 03/29/17 0034  AST 15  ALT 6*  ALKPHOS 69  BILITOT 0.3  PROT 8.1  ALBUMIN 3.2*     Lipase  No results found for: LIPASE   Medications: . nicotine  21 mg Transdermal Daily   Prior to Admission medications   Medication Sig Start Date End Date Taking? Authorizing Provider  ibuprofen (ADVIL,MOTRIN) 800 MG tablet Take 800 mg by mouth. 02/15/17  Yes [provider]    . sodium chloride 75 mL/hr at 03/31/17 0352  . iron dextran (INFED/DEXFERRUM) infusion      Anti-infectives    Start     Dose/Rate Route Frequency Ordered Stop   03/29/17 1300  cefoTEtan (CEFOTAN) 1 g in dextrose 5 % 50 mL IVPB  Status:   Discontinued     1 g 100 mL/hr over 30 Minutes Intravenous Every 12 hours 03/29/17 0626 03/29/17 2238   03/29/17 1000  doxycycline (VIBRA-TABS) tablet 100 mg  Status:  Discontinued     100 mg Oral Every 12 hours 03/29/17 0620 03/29/17 2238   03/29/17 0700  cefoTEtan (CEFOTAN) 1 g in dextrose 5 % 50 mL IVPB  Status:  Discontinued     1 g 100 mL/hr over 30 Minutes Intravenous Every 12 hours 03/29/17 0620 03/29/17 0626   03/29/17 0245  cefOXitin (MEFOXIN) 2 g in dextrose 5 % 50 mL IVPB     2 g 100 mL/hr over 30 Minutes Intravenous  Once 03/29/17 0236 03/29/17 0339   03/29/17 0245  doxycycline (VIBRAMYCIN) 100 mg in dextrose 5 % 250 mL IVPB     100 mg 125 mL/hr over 120 Minutes Intravenous  Once 03/29/17 0236 03/29/17 0540   03/29/17 0242  doxycycline (VIBRAMYCIN) 100 MG injection    Comments:  [name]   : cabinet override      03/29/17 0242 03/29/17 1444   03/29/17 0230  cefoTEtan (CEFOTAN) 2 g in dextrose 5 % 50 mL IVPB  Status:  Discontinued     2 g 100 mL/hr over 30 Minutes Intravenous Every 12 hours 03/29/17 0229 03/29/17  0236     Assessment/Plan  Admit to [facility] 03/28/17 for Tubo-ovarian abscess CT 7/28: 10 cm necrotic mass in the right adnexal region suspicious for malignancy. Tx to [facility] Colonoscopy 03/31/17, Dr. Karleen Hampshire Nandigam: Infiltrative mass with ulceration in the cecum, could not identify the ileocecal valve or appendiceal oraface. Loculated pelvic ascites Regional adenopathy Anemia Mild protein calorie malnutrition FEN:IV fluids/regular diet ID:  None currently DVT:  SCD   Plan:  Await path.    LOS: 3 days    Meagan [name] 04/01/2017 [phone]

## 2017-04-01 NOTE — Progress Notes (Signed)
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 Patient's had ongoing issues with right lower quadrant pain since March of this year, was seen at [facility] where CT scan showed severe cecal inflammation discharged home on antibiotics improved after this and then had recurrent pain, subsequently seen at [facility] in June where she had a transvaginal ultrasound which noted a large right adnexal mass, admitted there, treated with IV antibiotics. 7/30 colonoscopy with infiltrative cecal lesion, CT here suspicious for malignancy  Assessment & Plan:     Right lower quadrant abdominal mass -Appreciate GI and surgical evaluation -Colonoscopy 7/30 with infiltrative cecal lesion s/p biopsy-path pending -Supportive care Addendum:  Called by Dr.Nandigam GI-path s/o poorly differentiated AdenoCA-I have notified [facility] dr.Gerkin and Oncology Dr.Sherril, they will be discussing her case at GI tumor Board tomorrow am to determine next steps    Anemia -due to chronic disease and severe Iron defi -ammenorrheic for 18 months due to Nexplenon -given low dose IV iron 7/30, give another dose today, d/w PharmD  DVT prophylaxis: SCDs Code Status: Full code Family Communication: Husband at bedside Disposition Plan: Home pending above workup  Consultants:   Gastroenterology  General surgery   Subjective: Feels ok, wants to walk outside, no bleeding, usual pelvic discomfort  Objective: Vitals:   03/31/17 1450 03/31/17 1535 03/31/17 2117 04/01/17 0558  BP: (!) 91/26 99/65 (!) 92/59 (!) 88/66  Pulse: 95 94 (!) 106 (!) 52  Resp: (!)  26  18 15   Temp:  98.1 F (36.7 C) 98.2 F (36.8 C) 98.7 F (37.1 C)  TempSrc:  Oral Oral Oral  SpO2: 100% 100% 100% 90%  Weight:      Height:        Intake/Output Summary (Last 24 hours) at 04/01/17 1419 Last data filed at 04/01/17 1036  Gross per 24 hour  Intake          1816.25 ml  Output                0 ml  Net          1816.25 ml   Filed Weights   03/28/17 2309 03/29/17 2238 03/31/17 1300  Weight: 45.4 kg (100 lb) 45.3 kg (99 lb 14.4 oz) 44.9 kg (99 lb)    Examination:  Gen: Awake, Alert, Oriented X 3, flat affect, thinly built HEENT: PERRLA, Neck supple, no JVD Lungs: Good air movement bilaterally, CTAB CVS: RRR,No Gallops,Rubs or new Murmurs Abd: soft, Non tender, non distended, BS present Extremities: No Cyanosis, Clubbing or edema Skin: no new rashes  Data Reviewed:   CBC:  Recent Labs Lab 03/29/17 0034 03/29/17 0640 03/30/17 0421 03/31/17 0414  WBC 8.6 6.9 7.7 7.9  NEUTROABS 5.3  --   --   --   HGB 9.5* 8.3* 7.7* 8.1*  HCT 29.9* 27.0* 24.5* 26.1*  MCV 81.7 82.3 81.4 81.3  PLT 533* 418* 403* 299*   Basic Metabolic Panel:  Recent Labs Lab 03/29/17 0034 03/29/17 0640 03/30/17 0421 03/31/17 0414  NA 134*  --  140 137  K 3.5  --  4.1 3.3*  CL 98*  --  107 106  CO2  26  --  26 22  GLUCOSE 97  --  88 91  BUN 8  --  <5* <5*  CREATININE 0.75 0.57 0.58 0.56  CALCIUM 8.6*  --  8.5* 8.4*   GFR: Estimated Creatinine Clearance: 71.6 mL/min (by C-G formula based on SCr of 0.56 mg/dL). Liver Function Tests:  Recent Labs Lab 03/29/17 0034  AST 15  ALT 6*  ALKPHOS 69  BILITOT 0.3  PROT 8.1  ALBUMIN 3.2*   No results for input(s): LIPASE, AMYLASE in the last 168 hours. No results for input(s): AMMONIA in the last 168 hours. Coagulation Profile: No results for input(s): INR, PROTIME in the last 168 hours. Cardiac Enzymes: No results for input(s): CKTOTAL, CKMB, CKMBINDEX, TROPONINI in the last 168 hours. BNP (last 3 results) No results for  input(s): PROBNP in the last 8760 hours. HbA1C: No results for input(s): HGBA1C in the last 72 hours. CBG: No results for input(s): GLUCAP in the last 168 hours. Lipid Profile: No results for input(s): CHOL, HDL, LDLCALC, TRIG, CHOLHDL, LDLDIRECT in the last 72 hours. Thyroid Function Tests: No results for input(s): TSH, T4TOTAL, FREET4, T3FREE, THYROIDAB in the last 72 hours. Anemia Panel:  Recent Labs  03/30/17 0430 03/30/17 1115  VITAMINB12  --  272  FOLATE  --  13.3  FERRITIN  --  29  TIBC  --  263  IRON  --  8*  RETICCTPCT 1.1  --    Urine analysis:    Component Value Date/Time   COLORURINE YELLOW 03/28/2017 2310   APPEARANCEUR CLOUDY (A) 03/28/2017 2310   LABSPEC 1.008 03/28/2017 2310   PHURINE 7.5 03/28/2017 2310   GLUCOSEU NEGATIVE 03/28/2017 2310   HGBUR NEGATIVE 03/28/2017 2310   BILIRUBINUR NEGATIVE 03/28/2017 2310   KETONESUR NEGATIVE 03/28/2017 2310   PROTEINUR NEGATIVE 03/28/2017 2310   NITRITE NEGATIVE 03/28/2017 2310   LEUKOCYTESUR SMALL (A) 03/28/2017 2310   Sepsis Labs: @LABRCNTIP (procalcitonin:4,lacticidven:4)  )No results found for this or any previous visit (from the past 240 hour(s)).       Radiology Studies: No results found.      Scheduled Meds: . nicotine  21 mg Transdermal Daily   Continuous Infusions: . sodium chloride 75 mL/hr at 03/31/17 0352  . iron dextran (INFED/DEXFERRUM) infusion 750 mg (04/01/17 1051)     LOS: 3 days    Time spent: 68min    Domenic Polite, MD [facility] Pager [phone]  If 7PM-7AM, please contact night-coverage [email or web address] Password [email or web address] 04/01/2017, 2:19 PM

## 2017-04-02 ENCOUNTER — Inpatient Hospital Stay (HOSPITAL_COMMUNITY): Payer: Medicaid Other

## 2017-04-02 LAB — COMPREHENSIVE METABOLIC PANEL
ALK PHOS: 53 U/L (ref 38–126)
ALT: 6 U/L — ABNORMAL LOW (ref 14–54)
ANION GAP: 7 (ref 5–15)
AST: 12 U/L — ABNORMAL LOW (ref 15–41)
Albumin: 2.3 g/dL — ABNORMAL LOW (ref 3.5–5.0)
BUN: <5 mg/dL — ABNORMAL LOW (ref 6–20)
CALCIUM: 8.5 mg/dL — AB (ref 8.9–10.3)
CHLORIDE: 108 mmol/L (ref 101–111)
CO2: 25 mmol/L (ref 22–32)
Creatinine, Ser: 0.46 mg/dL (ref 0.44–1.00)
GFR calc non Af Amer: >60 mL/min (ref >60)
Glucose, Bld: 88 mg/dL (ref 65–99)
POTASSIUM: 3.2 mmol/L — AB (ref 3.5–5.1)
SODIUM: 140 mmol/L (ref 135–145)
Total Bilirubin: 0.5 mg/dL (ref 0.3–1.2)
Total Protein: 6 g/dL — ABNORMAL LOW (ref 6.5–8.1)

## 2017-04-02 LAB — CBC
HCT: 25 % — ABNORMAL LOW (ref 36.0–46.0)
Hemoglobin: 8 g/dL — ABNORMAL LOW (ref 12.0–15.0)
MCH: 25.8 pg — AB (ref 26.0–34.0)
MCHC: 32 g/dL (ref 30.0–36.0)
MCV: 80.6 fL (ref 78.0–100.0)
PLATELETS: 371 10*3/uL (ref 150–400)
RBC: 3.1 MIL/uL — ABNORMAL LOW (ref 3.87–5.11)
RDW: 20.8 % — AB (ref 11.5–15.5)
WBC: 8.3 10*3/uL (ref 4.0–10.5)

## 2017-04-02 LAB — PREALBUMIN: Prealbumin: 7.3 mg/dL — ABNORMAL LOW (ref 18–38)

## 2017-04-02 LAB — CEA: CEA1: 13 ng/mL — AB (ref 0.0–4.7)

## 2017-04-02 LAB — CA 125: Cancer Antigen (CA) 125: 39.4 U/mL — ABNORMAL HIGH (ref 0.0–38.1)

## 2017-04-02 MED ORDER — BOOST / RESOURCE BREEZE PO LIQD
1.0000 | Freq: Three times a day (TID) | ORAL | Status: DC
  Administered 2017-04-02: 1 via ORAL

## 2017-04-02 MED ORDER — NEOMYCIN SULFATE 500 MG PO TABS
1000.0000 mg | ORAL_TABLET | ORAL | Status: DC
  Administered 2017-04-03: 1000 mg via ORAL
  Filled 2017-04-02 (×2): qty 2

## 2017-04-02 MED ORDER — CHLORHEXIDINE GLUCONATE CLOTH 2 % EX PADS
6.0000 | MEDICATED_PAD | Freq: Once | CUTANEOUS | Status: AC
  Administered 2017-04-03: 6 via TOPICAL

## 2017-04-02 MED ORDER — ALVIMOPAN 12 MG PO CAPS
12.0000 mg | ORAL_CAPSULE | ORAL | Status: AC
  Administered 2017-04-03: 12 mg via ORAL
  Filled 2017-04-02: qty 1

## 2017-04-02 MED ORDER — METRONIDAZOLE 500 MG PO TABS
1000.0000 mg | ORAL_TABLET | ORAL | Status: DC
  Administered 2017-04-03: 1000 mg via ORAL
  Filled 2017-04-02: qty 2

## 2017-04-02 MED ORDER — LORAZEPAM 0.5 MG PO TABS
0.5000 mg | ORAL_TABLET | Freq: Three times a day (TID) | ORAL | Status: DC | PRN
  Administered 2017-04-02 – 2017-04-03 (×2): 0.5 mg via ORAL
  Filled 2017-04-02 (×2): qty 1

## 2017-04-02 MED ORDER — CHLORHEXIDINE GLUCONATE CLOTH 2 % EX PADS
6.0000 | MEDICATED_PAD | Freq: Once | CUTANEOUS | Status: AC
  Administered 2017-04-04: 6 via TOPICAL

## 2017-04-02 MED ORDER — METRONIDAZOLE 500 MG PO TABS: 1000.0000 mg | ORAL_TABLET | ORAL | Status: DC

## 2017-04-02 MED ORDER — KCL IN DEXTROSE-NACL 40-5-0.9 MEQ/L-%-% IV SOLN
INTRAVENOUS | Status: DC
  Administered 2017-04-02 – 2017-04-04 (×3): via INTRAVENOUS
  Filled 2017-04-02 (×3): qty 1000

## 2017-04-02 MED ORDER — POTASSIUM CHLORIDE CRYS ER 20 MEQ PO TBCR
40.0000 meq | EXTENDED_RELEASE_TABLET | Freq: Once | ORAL | Status: AC
  Administered 2017-04-02: 40 meq via ORAL
  Filled 2017-04-02: qty 2

## 2017-04-02 MED ORDER — DEXTROSE 5 % IV SOLN
2.0000 g | INTRAVENOUS | Status: AC
  Administered 2017-04-04: 2 g via INTRAVENOUS
  Filled 2017-04-02: qty 2

## 2017-04-02 MED ORDER — NEOMYCIN SULFATE 500 MG PO TABS: 1000.0000 mg | ORAL_TABLET | ORAL | Status: DC

## 2017-04-02 MED ORDER — ZOLPIDEM TARTRATE 5 MG PO TABS
5.0000 mg | ORAL_TABLET | Freq: Every evening | ORAL | Status: DC | PRN
  Administered 2017-04-02 – 2017-04-03 (×2): 5 mg via ORAL
  Filled 2017-04-02 (×2): qty 1

## 2017-04-02 MED ORDER — POLYETHYLENE GLYCOL 3350 17 GM/SCOOP PO POWD
1.0000 | Freq: Once | ORAL | Status: AC
  Administered 2017-04-03: 255 g via ORAL
  Filled 2017-04-02: qty 255

## 2017-04-02 NOTE — Progress Notes (Signed)
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 Patient's had ongoing issues with right lower quadrant pain since March of this year, was seen at [facility] where CT scan showed severe cecal inflammation discharged home on antibiotics improved after this and then had recurrent pain, subsequently seen at [facility] in June where she had a transvaginal ultrasound which noted a large right adnexal mass, admitted there, treated with IV antibiotics. 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  -Appreciate GI and surgical evaluation as well as urology assistance. Patient's case was discussed at GI tumor board along with patient's oncologist Dr. Ammie Dalton. Plan is performing exploratory laparotomy, colon resection with ostomy creation and possible salpingo-oophorectomy on the right. urology will be placing stent in the right ureter. Procedure tentatively scheduled on Friday morning.    Anemia -due to chronic disease and severe Iron defi -ammenorrheic for 18 months due to Nexplenon -given low dose IV iron 7/30, give another dose today, d/w PharmD  Active smoker. Patient is an active smoker. Counseled against smoking. Nicotine patch provided. Reminded the patient that [facility] facility is smoking-free facility.  Anxiety. When necessary Ativan added. When necessary Ambien added for insomnia.  DVT prophylaxis: SCDs Code  Status: Full code Family Communication: Husband at bedside Disposition Plan: Home pending above workup  Consultants:   Gastroenterology  General surgery   Subjective: Pain is well-controlled. No breathing. No nausea no vomiting. Requesting anxiety medication  Objective: Vitals:   04/01/17 0558 04/01/17 2055 04/02/17 0648 04/02/17 1400  BP: (!) 88/66 99/62 (!) 90/59 90/62  Pulse: (!) 52 (!) 105 92 (!) 102  Resp: 15 16 16 16   Temp: 98.7 F (37.1 C) 99.3 F (37.4 C) 98.1 F (36.7 C) (!) 97.5 F (36.4 C)  TempSrc: Oral Oral Oral Axillary  SpO2: 90% 97% 98% 100%  Weight:      Height:        Intake/Output Summary (Last 24 hours) at 04/02/17 1759 Last data filed at 04/02/17 0959  Gross per 24 hour  Intake              480 ml  Output                0 ml  Net              480 ml   Filed Weights   03/28/17 2309 03/29/17 2238 03/31/17 1300  Weight: 45.4 kg (100 lb) 45.3 kg (99 lb 14.4 oz) 44.9 kg (99 lb)    Examination:  Gen: Awake, Alert, Oriented X 3, flat affect, thinly built HEENT: PERRLA, Neck supple, no JVD Lungs: Good air movement bilaterally, CTAB CVS: RRR,No Gallops,Rubs or new Murmurs Abd: soft, Non tender, non distended, BS present Extremities: No Cyanosis, Clubbing or edema Skin: no new rashes  Data Reviewed:   CBC:  Recent Labs Lab 03/29/17 0034 03/29/17 0640 03/30/17 0421 03/31/17 0414 04/02/17 0357  WBC 8.6 6.9 7.7 7.9 8.3  NEUTROABS 5.3  --   --   --   --  HGB 9.5* 8.3* 7.7* 8.1* 8.0*  HCT 29.9* 27.0* 24.5* 26.1* 25.0*  MCV 81.7 82.3 81.4 81.3 80.6  PLT 533* 418* 403* 443* 712   Basic Metabolic Panel:  Recent Labs Lab 03/29/17 0034 03/29/17 0640 03/30/17 0421 03/31/17 0414 04/02/17 0357  NA 134*  --  140 137 140  K 3.5  --  4.1 3.3* 3.2*  CL 98*  --  107 106 108  CO2 26  --  26 22 25   GLUCOSE 97  --  88 91 88  BUN 8  --  <5* <5* <5*  CREATININE 0.75 0.57 0.58 0.56 0.46  CALCIUM 8.6*  --  8.5* 8.4* 8.5*   GFR: Estimated  Creatinine Clearance: 71.6 mL/min (by C-G formula based on SCr of 0.46 mg/dL). Liver Function Tests:  Recent Labs Lab 03/29/17 0034 04/02/17 0357  AST 15 12*  ALT 6* 6*  ALKPHOS 69 53  BILITOT 0.3 0.5  PROT 8.1 6.0*  ALBUMIN 3.2* 2.3*   No results for input(s): LIPASE, AMYLASE in the last 168 hours. No results for input(s): AMMONIA in the last 168 hours. Coagulation Profile: No results for input(s): INR, PROTIME in the last 168 hours. Cardiac Enzymes: No results for input(s): CKTOTAL, CKMB, CKMBINDEX, TROPONINI in the last 168 hours. BNP (last 3 results) No results for input(s): PROBNP in the last 8760 hours. HbA1C: No results for input(s): HGBA1C in the last 72 hours. CBG: No results for input(s): GLUCAP in the last 168 hours. Lipid Profile: No results for input(s): CHOL, HDL, LDLCALC, TRIG, CHOLHDL, LDLDIRECT in the last 72 hours. Thyroid Function Tests: No results for input(s): TSH, T4TOTAL, FREET4, T3FREE, THYROIDAB in the last 72 hours. Anemia Panel: No results for input(s): VITAMINB12, FOLATE, FERRITIN, TIBC, IRON, RETICCTPCT in the last 72 hours. Urine analysis:    Component Value Date/Time   COLORURINE YELLOW 03/28/2017 2310   APPEARANCEUR CLOUDY (A) 03/28/2017 2310   LABSPEC 1.008 03/28/2017 2310   PHURINE 7.5 03/28/2017 2310   GLUCOSEU NEGATIVE 03/28/2017 2310   HGBUR NEGATIVE 03/28/2017 2310   BILIRUBINUR NEGATIVE 03/28/2017 2310   KETONESUR NEGATIVE 03/28/2017 2310   PROTEINUR NEGATIVE 03/28/2017 2310   NITRITE NEGATIVE 03/28/2017 2310   LEUKOCYTESUR SMALL (A) 03/28/2017 2310   Sepsis Labs: @LABRCNTIP (procalcitonin:4,lacticidven:4)  )No results found for this or any previous visit (from the past 240 hour(s)).       Radiology Studies: Dg Chest 2 View  Result Date: 04/02/2017 CLINICAL DATA:  Preop colon cancer.  No chest complaints. EXAM: CHEST  2 VIEW COMPARISON:  None. FINDINGS: The heart size and mediastinal contours are within normal limits.  Both lungs are clear. The visualized skeletal structures are unremarkable. Mild rotoscoliosis convex RIGHT midthoracic region. IMPRESSION: No active cardiopulmonary disease. Electronically Signed   By: Staci Righter M.D.   On: 04/02/2017 11:40        Scheduled Meds: . [START ON 04/03/2017] alvimopan  12 mg Oral On Call to OR  . Chlorhexidine Gluconate Cloth  6 each Topical Once   And  . Chlorhexidine Gluconate Cloth  6 each Topical Once  . feeding supplement  1 Container Oral TID BM  . [START ON 04/03/2017] metroNIDAZOLE  1,000 mg Oral 3 times per day   And  . [START ON 04/03/2017] neomycin  1,000 mg Oral 3 times per day  . nicotine  21 mg Transdermal Daily  . [START ON 04/03/2017] polyethylene glycol powder  1 Container Oral Once   Continuous Infusions: . [START ON 04/04/2017] cefoTEtan (  CEFOTAN) IV    . dextrose 5 % and 0.9 % NaCl with KCl 40 mEq/L Stopped (04/02/17 1646)     LOS: 4 days    Time spent: 3min    Domenic Polite, MD [facility] Pager [phone]  If 7PM-7AM, please contact night-coverage [email or web address] Password [email or web address] 04/02/2017, 5:59 PM

## 2017-04-02 NOTE — Progress Notes (Signed)
2 Days Post-Op    CC:  RUQ tenderness and mass  Subjective: Nursing said she had a hard night after hearing the news from the pathology.  She says she is going to beat this and be cancer free.  We talked some about plans.  She is agreeable with this  Objective: Vital signs in last 24 hours: Temp:  [98.1 F (36.7 C)-99.3 F (37.4 C)] 98.1 F (36.7 C) (08/01 0648) Pulse Rate:  [92-105] 92 (08/01 0648) Resp:  [16] 16 (08/01 0648) BP: (90-99)/(59-62) 90/59 (08/01 0648) SpO2:  [97 %-98 %] 98 % (08/01 0648) Last BM Date: 04/01/17 960 PO Voided x 3 recorded Afebrile, VSS K+ 3.2  Intake/Output from previous day: 07/31 0701 - 08/01 0700 In: 960 [P.O.:960] Out: -  Intake/Output this shift: No intake/output data recorded.  General appearance: no distress  Lab Results:   Recent Labs  03/31/17 0414 04/02/17 0357  WBC 7.9 8.3  HGB 8.1* 8.0*  HCT 26.1* 25.0*  PLT 443* 371    BMET  Recent Labs  03/31/17 0414 04/02/17 0357  NA 137 140  K 3.3* 3.2*  CL 106 108  CO2 22 25  GLUCOSE 91 88  BUN <5* <5*  CREATININE 0.56 0.46  CALCIUM 8.4* 8.5*   PT/INR No results for input(s): LABPROT, INR in the last 72 hours.   Recent Labs Lab 03/29/17 0034 04/02/17 0357  AST 15 12*  ALT 6* 6*  ALKPHOS 69 53  BILITOT 0.3 0.5  PROT 8.1 6.0*  ALBUMIN 3.2* 2.3*     Lipase  No results found for: LIPASE   Medications: . nicotine  21 mg Transdermal Daily  . potassium chloride  40 mEq Oral Once    Assessment/Plan Admit to [facility] 03/28/17 for Tubo-ovarian abscess CT 7/28: 10 cm necrotic mass in the right adnexal region suspicious for malignancy. Tx to [facility] Colonoscopy 03/31/17, Dr. Karleen Hampshire Nandigam: Infiltrative mass with ulceration in the cecum, could not identify the ileocecal valve or appendiceal oraface. Loculated pelvic ascites Regional adenopathy Anemia Mild protein calorie malnutrition FEN:IV fluids/regular diet ID:  None currently DVT:  SCD Plan:  Starting bowel prep  today, clears, update labs.  Do we need a CT of the chest?  She is on iron and we will type and cross her in AM.        LOS: 4 days    Marshall Kampf 04/02/2017 [phone]

## 2017-04-02 NOTE — Consult Note (Signed)
Urology Consult  Referring physician: Marlowe Sax Reason for referral: Ovarian abscess  Chief Complaint: Ovarian abscess  History of Present Illness: Right low back pain with cecal abnormality; treated for ovaian abscess; large necrotic mass in right iliac fossa and pos node; colonocopy demonstrated large ulcer in cecum; biopsy noted likely colon carcinoma; planning right colectomy likely Friday am and needing of stent Dr. Armandina Gemma; Cr .54; no hydro  Modifying factors: There are no other modifying factors  Associated signs and symptoms: There are no other associated signs and symptoms Aggravating and relieving factors: There are no other aggravating or relieving factors Severity: Moderate Duration: Persistent   Past Medical History:  Diagnosis Date  . Fallopian tube abscess    June 2018 right side  . Ovarian cyst    Past Surgical History:  Procedure Laterality Date  . APPENDECTOMY  2010  . COLONOSCOPY N/A 03/31/2017   Procedure: COLONOSCOPY;  Surgeon: Mauri Pole, MD;  Location: [facility] ENDOSCOPY;  Service: Endoscopy;  Laterality: N/A;    Medications: I have reviewed the patient's current medications. Allergies: No Known Allergies  Family History  Problem Relation Age of Onset  . Leukemia Father        "Just got diagnosed, but not the 'serious type'" (sounds like CLL if I had to guess)   Social History:  reports that she has been smoking.  She has never used smokeless tobacco. She reports that she does not drink alcohol or use drugs.  ROS: All systems are reviewed and negative except as noted. Rest negative  Physical Exam:  Vital signs in last 24 hours: Temp:  [97.5 F (36.4 C)-99.3 F (37.4 C)] 97.5 F (36.4 C) (08/01 1400) Pulse Rate:  [92-105] 102 (08/01 1400) Resp:  [16] 16 (08/01 1400) BP: (90-99)/(59-62) 90/62 (08/01 1400) SpO2:  [97 %-100 %] 100 % (08/01 1400)  Cardiovascular: Skin warm; not flushed Respiratory: Breaths quiet; no shortness of  breath Abdomen: No masses Neurological: Normal sensation to touch Musculoskeletal: Normal motor function arms and legs Lymphatics: No inguinal adenopathy Skin: No rashes Genitourinary:no distress  Laboratory Data:  Results for orders placed or performed during the hospital encounter of 03/28/17 (from the past 72 hour(s))  CBC     Status: Abnormal   Collection Time: 03/31/17  4:14 AM  Result Value Ref Range   WBC 7.9 4.0 - 10.5 K/uL   RBC 3.21 (L) 3.87 - 5.11 MIL/uL   Hemoglobin 8.1 (L) 12.0 - 15.0 g/dL   HCT 26.1 (L) 36.0 - 46.0 %   MCV 81.3 78.0 - 100.0 fL   MCH 25.2 (L) 26.0 - 34.0 pg   MCHC 31.0 30.0 - 36.0 g/dL   RDW 20.7 (H) 11.5 - 15.5 %   Platelets 443 (H) 150 - 400 K/uL  Basic metabolic panel     Status: Abnormal   Collection Time: 03/31/17  4:14 AM  Result Value Ref Range   Sodium 137 135 - 145 mmol/L   Potassium 3.3 (L) 3.5 - 5.1 mmol/L    Comment: DELTA CHECK NOTED   Chloride 106 101 - 111 mmol/L   CO2 22 22 - 32 mmol/L   Glucose, Bld 91 65 - 99 mg/dL   BUN <5 (L) 6 - 20 mg/dL   Creatinine, Ser 0.56 0.44 - 1.00 mg/dL   Calcium 8.4 (L) 8.9 - 10.3 mg/dL   GFR calc non Af Amer >60 >60 mL/min   GFR calc Af Amer >60 >60 mL/min    Comment: (NOTE) The  eGFR has been calculated using the CKD EPI equation. This calculation has not been validated in all clinical situations. eGFR's persistently <60 mL/min signify possible Chronic Kidney Disease.    Anion gap 9 5 - 15  CEA     Status: Abnormal   Collection Time: 03/31/17  5:20 PM  Result Value Ref Range   CEA 13.0 (H) 0.0 - 4.7 ng/mL    Comment: (NOTE)       Roche ECLIA methodology       Nonsmokers  <3.9                                     Smokers     <5.6 Performed At: [facility] [place], [place] [id number] Lindon Romp MD WK:[id number]   CA 125     Status: Abnormal   Collection Time: 03/31/17  5:20 PM  Result Value Ref Range   Cancer Antigen (CA) 125 39.4 (H) 0.0 - 38.1 U/mL     Comment: (NOTE) Roche ECLIA methodology Performed At: [facility] [place], [place] [id number] Lindon Romp MD BF:[id number]   CBC     Status: Abnormal   Collection Time: 04/02/17  3:57 AM  Result Value Ref Range   WBC 8.3 4.0 - 10.5 K/uL   RBC 3.10 (L) 3.87 - 5.11 MIL/uL   Hemoglobin 8.0 (L) 12.0 - 15.0 g/dL   HCT 25.0 (L) 36.0 - 46.0 %   MCV 80.6 78.0 - 100.0 fL   MCH 25.8 (L) 26.0 - 34.0 pg   MCHC 32.0 30.0 - 36.0 g/dL   RDW 20.8 (H) 11.5 - 15.5 %   Platelets 371 150 - 400 K/uL  Comprehensive metabolic panel     Status: Abnormal   Collection Time: 04/02/17  3:57 AM  Result Value Ref Range   Sodium 140 135 - 145 mmol/L   Potassium 3.2 (L) 3.5 - 5.1 mmol/L   Chloride 108 101 - 111 mmol/L   CO2 25 22 - 32 mmol/L   Glucose, Bld 88 65 - 99 mg/dL   BUN <5 (L) 6 - 20 mg/dL   Creatinine, Ser 0.46 0.44 - 1.00 mg/dL   Calcium 8.5 (L) 8.9 - 10.3 mg/dL   Total Protein 6.0 (L) 6.5 - 8.1 g/dL   Albumin 2.3 (L) 3.5 - 5.0 g/dL   AST 12 (L) 15 - 41 U/L   ALT 6 (L) 14 - 54 U/L   Alkaline Phosphatase 53 38 - 126 U/L   Total Bilirubin 0.5 0.3 - 1.2 mg/dL   GFR calc non Af Amer >60 >60 mL/min   GFR calc Af Amer >60 >60 mL/min    Comment: (NOTE) The eGFR has been calculated using the CKD EPI equation. This calculation has not been validated in all clinical situations. eGFR's persistently <60 mL/min signify possible Chronic Kidney Disease.    Anion gap 7 5 - 15  Prealbumin     Status: Abnormal   Collection Time: 04/02/17 10:45 AM  Result Value Ref Range   Prealbumin 7.3 (L) 18 - 38 mg/dL    Comment: Performed at [facility] [address] [place], [place] [address]   No results found for this or any previous visit (from the past 240 hour(s)). Creatinine:  Recent Labs  03/29/17 0034 03/29/17 0640 03/30/17 0421 03/31/17 0414 04/02/17 0357  CREATININE 0.75 0.57 0.58 0.56 0.46  Xrays: See report/chart noted  Impression/Assessment:   Reviewed chart; no hydro now  Plan:  Will speak to Dr Dutch Gray on call Friday for stent Pros and cons and risks and sequelae dicussed Will follow   Audria Takeshita A 04/02/2017, 3:10 PM

## 2017-04-02 NOTE — Progress Notes (Signed)
Patient wants to go outside to the [facility], RN told patient and husband  That we don't allow patient to go off the unit and outside the hospital building, patient got very upset with RN. Drue Dun RN and Dr Posey Pronto went to talk to patient and Dr Posey Pronto said he will find out more about the policy.

## 2017-04-02 NOTE — Progress Notes (Signed)
Initial Nutrition Assessment  DOCUMENTATION CODES:   Underweight, Severe malnutrition in context of acute illness/injury  INTERVENTION:   Provide Boost Breeze po TID, each supplement provides 250 kcal and 9 grams of protein Encourage fluid/supplemental intake  RD will continue to monitor for plan  NUTRITION DIAGNOSIS:   Malnutrition (severe) related to acute illness as evidenced by severe depletion of body fat, severe depletion of muscle mass, energy intake < or equal to 50% for > or equal to 5 days.  GOAL:   Patient will meet greater than or equal to 90% of their needs  MONITOR:   PO intake, Supplement acceptance, Diet advancement, Labs, Weight trends, I & O's  REASON FOR ASSESSMENT:   Consult Assessment of nutrition requirement/status  ASSESSMENT:   31 y.o. female with RLQ abd pain and abnormal imaging concerning for malignant lesion of R adnexa or colon. 7/30: colonoscopy reveals mass with extensive ulceration in cecum. Pathology consistent with carcinoma.  Patient in room with husband at bedside. Pt on clear liquids, currently sipping on Propels. Pt's husband has provided pt with Gatorade as well. Pt states she was eating normally with good appetite PTA. Pt willing to try Boost Breeze to provide additional calories and protein prior to surgery that is expected to occur on Friday 8/3.   Per patient, UBW is 110 lb. Pt unable to state when she last weighed 110 lb. Nutrition-Focused physical exam completed. Findings are severe fat depletion, severe muscle depletion, and no edema.   Medications: D5 and .9% NaCl w/ KCl infusion at 75 ml/hr -provides 306 kcal  Labs reviewed: Low K  Diet Order:  Diet clear liquid Room service appropriate? Yes; Fluid consistency: Thin  Skin:  Reviewed, no issues  Last BM:  7/31  Height:   Ht Readings from Last 1 Encounters:  03/31/17 5\' 2"  (1.575 m)    Weight:   Wt Readings from Last 1 Encounters:  03/31/17 99 lb (44.9 kg)     Ideal Body Weight:  50 kg  BMI:  Body mass index is 18.11 kg/m.  Estimated Nutritional Needs:   Kcal:  1400-1600  Protein:  65-75g  Fluid:  1.6L/day  EDUCATION NEEDS:   Education needs addressed  Clayton Bibles, MS, RD, LDN Pager: [phone] After Hours Pager: [phone]

## 2017-04-03 LAB — CBC WITH DIFFERENTIAL/PLATELET
BASOS ABS: 0 10*3/uL (ref 0.0–0.1)
BASOS PCT: 0 %
EOS PCT: 9 %
Eosinophils Absolute: 0.7 10*3/uL (ref 0.0–0.7)
HEMATOCRIT: 25.4 % — AB (ref 36.0–46.0)
Hemoglobin: 8.1 g/dL — ABNORMAL LOW (ref 12.0–15.0)
Lymphocytes Relative: 28 %
Lymphs Abs: 2.1 10*3/uL (ref 0.7–4.0)
MCH: 25.9 pg — ABNORMAL LOW (ref 26.0–34.0)
MCHC: 31.9 g/dL (ref 30.0–36.0)
MCV: 81.2 fL (ref 78.0–100.0)
MONO ABS: 0.6 10*3/uL (ref 0.1–1.0)
MONOS PCT: 8 %
NEUTROS ABS: 4.2 10*3/uL (ref 1.7–7.7)
Neutrophils Relative %: 55 %
PLATELETS: 371 10*3/uL (ref 150–400)
RBC: 3.13 MIL/uL — ABNORMAL LOW (ref 3.87–5.11)
RDW: 20.9 % — AB (ref 11.5–15.5)
WBC: 7.6 10*3/uL (ref 4.0–10.5)

## 2017-04-03 LAB — COMPREHENSIVE METABOLIC PANEL
ALBUMIN: 2.5 g/dL — AB (ref 3.5–5.0)
ALK PHOS: 54 U/L (ref 38–126)
ALT: 6 U/L — AB (ref 14–54)
ANION GAP: 5 (ref 5–15)
AST: 13 U/L — AB (ref 15–41)
BILIRUBIN TOTAL: 0.6 mg/dL (ref 0.3–1.2)
CALCIUM: 8.7 mg/dL — AB (ref 8.9–10.3)
CO2: 27 mmol/L (ref 22–32)
Chloride: 107 mmol/L (ref 101–111)
Creatinine, Ser: 0.49 mg/dL (ref 0.44–1.00)
GFR calc Af Amer: >60 mL/min (ref >60)
GFR calc non Af Amer: >60 mL/min (ref >60)
GLUCOSE: 84 mg/dL (ref 65–99)
Potassium: 3.6 mmol/L (ref 3.5–5.1)
SODIUM: 139 mmol/L (ref 135–145)
TOTAL PROTEIN: 6.4 g/dL — AB (ref 6.5–8.1)

## 2017-04-03 LAB — TYPE AND SCREEN
ABO/RH(D): O POS
Antibody Screen: NEGATIVE

## 2017-04-03 LAB — APTT: APTT: 35 s (ref 24–36)

## 2017-04-03 LAB — ABO/RH: ABO/RH(D): O POS

## 2017-04-03 LAB — PROTIME-INR
INR: 1.32
PROTHROMBIN TIME: 16.5 s — AB (ref 11.4–15.2)

## 2017-04-03 LAB — MAGNESIUM: Magnesium: 1.8 mg/dL (ref 1.7–2.4)

## 2017-04-03 MED ORDER — NEOMYCIN SULFATE 500 MG PO TABS
1000.0000 mg | ORAL_TABLET | ORAL | Status: AC
  Administered 2017-04-03 (×2): 1000 mg via ORAL
  Filled 2017-04-03 (×2): qty 2

## 2017-04-03 MED ORDER — METRONIDAZOLE 500 MG PO TABS
1000.0000 mg | ORAL_TABLET | ORAL | Status: AC
  Administered 2017-04-03 (×2): 1000 mg via ORAL
  Filled 2017-04-03 (×2): qty 2

## 2017-04-03 NOTE — Progress Notes (Signed)
3 Days Post-Op    CC:  Abdominal pain  Subjective: Sleeping her husband is with her.  Objective: Vital signs in last 24 hours: Temp:  [97.5 F (36.4 C)-99.5 F (37.5 C)] 98.4 F (36.9 C) (08/02 0448) Pulse Rate:  [102-112] 102 (08/02 0448) Resp:  [16-18] 16 (08/02 0448) BP: (90-99)/(59-72) 99/72 (08/02 0448) SpO2:  [97 %-100 %] 97 % (08/02 0448) Last BM Date: 04/01/17 720 PO Voided x 3  Bowel prep Afebrile, VSS Labs OK CEA 13;  CA125 39.4 Prealbumin 7.3 Intake/Output from previous day: 08/01 0701 - 08/02 0700 In: 720 [P.O.:720] Out: -  Intake/Output this shift: No intake/output data recorded.  General appearance: sleeping I did not wake her this AM, bowel prep to be started soon.  Lab Results:   Recent Labs  04/02/17 0357 04/03/17 0358  WBC 8.3 7.6  HGB 8.0* 8.1*  HCT 25.0* 25.4*  PLT 371 371    BMET  Recent Labs  04/02/17 0357 04/03/17 0358  NA 140 139  K 3.2* 3.6  CL 108 107  CO2 25 27  GLUCOSE 88 84  BUN <5* <5*  CREATININE 0.46 0.49  CALCIUM 8.5* 8.7*   PT/INR  Recent Labs  04/03/17 0358  LABPROT 16.5*  INR 1.32     Recent Labs Lab 03/29/17 0034 04/02/17 0357 04/03/17 0358  AST 15 12* 13*  ALT 6* 6* 6*  ALKPHOS 69 53 54  BILITOT 0.3 0.5 0.6  PROT 8.1 6.0* 6.4*  ALBUMIN 3.2* 2.3* 2.5*     Lipase  No results found for: LIPASE   Medications: . Chlorhexidine Gluconate Cloth  6 each Topical Once   And  . Chlorhexidine Gluconate Cloth  6 each Topical Once  . feeding supplement  1 Container Oral TID BM  . metroNIDAZOLE  1,000 mg Oral 3 times per day   And  . neomycin  1,000 mg Oral 3 times per day  . nicotine  21 mg Transdermal Daily  . polyethylene glycol powder  1 Container Oral Once    Assessment/Plan Admit to [facility] 03/28/17 for Tubo-ovarian abscess CT 7/28: 10 cm necrotic mass in the right adnexal region suspicious for malignancy. Tx to [facility] Colonoscopy 03/31/17, Dr. Karleen Hampshire Nandigam: Infiltrative mass with ulceration  in the cecum, could not identify the ileocecal valve or appendiceal oraface. Pathology positive for poorly differentiated adenocarcinoma. Loculated pelvic ascites Regional adenopathy Anemia Severe  protein calorie malnutrition - prealbumin 7.3 FEN:IV fluids/regular diet ID: None currently DVT: SCD  Plan:  Bowel prep not given yesterday, not sure why.  For surgery tomorrow.  Dr. Harlow Asa will talk with them tomorrow.  No Chest CT at this point.        LOS: 5 days    Meagan [name] 04/03/2017 [phone]

## 2017-04-03 NOTE — Progress Notes (Signed)
[facility] PROGRESS NOTE    Progress Note  Meagan [name]  EXB:[id number] DOB: Aug 23, 1986 DOA: 03/28/2017 PCP: Patient, No Pcp Per     Brief Narrative:   Meagan [name] is an 31 y.o. female transfer from [facility] for right pelvic mass evaluation, colonoscopy performed 03/31/2017 showed infiltrate cecal mass suspicious for malignancy. Surgery was consulted who recommended surgical intervention planned 23 2018.  Assessment/Plan:   Principal Problem:   Right lower quadrant abdominal mass Active Problems:   Anemia Appreciate GI and urology assistance. The plan is for exploratory laparotomy with colon resection and probably ostomy with possible septic bingo oophorectomy of the right on 04/04/2017. Urology at this time placing a right ureteral stent. Her anemia is likely iron deficiency she received IV iron.   DVT prophylaxis: SCD Family Communication:husband Disposition Plan/Barrier to D/C: unable to determine Code Status:     Code Status Orders        Start     Ordered   03/29/17 0609  Full code  Continuous     03/29/17 0620    Code Status History    Date Active Date Inactive Code Status Order ID Comments User Context   This patient has a current code status but no historical code status.        IV Access:    Peripheral IV   Procedures and diagnostic studies:   Dg Chest 2 View  Result Date: 04/02/2017 CLINICAL DATA:  Preop colon cancer.  No chest complaints. EXAM: CHEST  2 VIEW COMPARISON:  None. FINDINGS: The heart size and mediastinal contours are within normal limits. Both lungs are clear. The visualized skeletal structures are unremarkable. Mild rotoscoliosis convex RIGHT midthoracic region. IMPRESSION: No active cardiopulmonary disease. Electronically Signed   By: Staci Righter M.D.   On: 04/02/2017 11:40     Medical Consultants:    None.  Anti-Infectives:   none  Subjective:    Meagan [name] no compalins  Objective:     Vitals:   04/02/17 0648 04/02/17 1400 04/02/17 2032 04/03/17 0448  BP: (!) 90/59 90/62 (!) 98/59 99/72  Pulse: 92 (!) 102 (!) 112 (!) 102  Resp: 16 16 18 16   Temp: 98.1 F (36.7 C) (!) 97.5 F (36.4 C) 99.5 F (37.5 C) 98.4 F (36.9 C)  TempSrc: Oral Axillary Oral Oral  SpO2: 98% 100% 98% 97%  Weight:      Height:        Intake/Output Summary (Last 24 hours) at 04/03/17 1040 Last data filed at 04/02/17 2246  Gross per 24 hour  Intake              480 ml  Output                0 ml  Net              480 ml   Filed Weights   03/28/17 2309 03/29/17 2238 03/31/17 1300  Weight: 45.4 kg (100 lb) 45.3 kg (99 lb 14.4 oz) 44.9 kg (99 lb)    Exam: General exam: In no acute distress. Respiratory system: Good air movement and clear to auscultation. Cardiovascular system: Regular rate and rhythm with positive S1-S2. Gastrointestinal system: Positive bowel sounds soft nontender nondistended. Central nervous system: Awake alert and oriented 3 nonfocal. Extremities: No edema Skin: No rashes. Psychiatry: Judgement and insight appear normal. Mood & affect appropriate.    Data Reviewed:    Labs: Basic Metabolic Panel:  Recent Labs Lab 03/29/17 0034 03/29/17  [id number] 03/30/17 0421 03/31/17 0414 04/02/17 0357 04/03/17 0358  NA 134*  --  140 137 140 139  K 3.5  --  4.1 3.3* 3.2* 3.6  CL 98*  --  107 106 108 107  CO2 26  --  26 22 25 27   GLUCOSE 97  --  88 91 88 84  BUN 8  --  <5* <5* <5* <5*  CREATININE 0.75 0.57 0.58 0.56 0.46 0.49  CALCIUM 8.6*  --  8.5* 8.4* 8.5* 8.7*  MG  --   --   --   --   --  1.8   GFR Estimated Creatinine Clearance: 71.6 mL/min (by C-G formula based on SCr of 0.49 mg/dL). Liver Function Tests:  Recent Labs Lab 03/29/17 0034 04/02/17 0357 04/03/17 0358  AST 15 12* 13*  ALT 6* 6* 6*  ALKPHOS 69 53 54  BILITOT 0.3 0.5 0.6  PROT 8.1 6.0* 6.4*  ALBUMIN 3.2* 2.3* 2.5*   No results for input(s): LIPASE, AMYLASE in the last 168 hours. No  results for input(s): AMMONIA in the last 168 hours. Coagulation profile  Recent Labs Lab 04/03/17 0358  INR 1.32    CBC:  Recent Labs Lab 03/29/17 0034 03/29/17 0640 03/30/17 0421 03/31/17 0414 04/02/17 0357 04/03/17 0358  WBC 8.6 6.9 7.7 7.9 8.3 7.6  NEUTROABS 5.3  --   --   --   --  4.2  HGB 9.5* 8.3* 7.7* 8.1* 8.0* 8.1*  HCT 29.9* 27.0* 24.5* 26.1* 25.0* 25.4*  MCV 81.7 82.3 81.4 81.3 80.6 81.2  PLT 533* 418* 403* 443* 371 371   Cardiac Enzymes: No results for input(s): CKTOTAL, CKMB, CKMBINDEX, TROPONINI in the last 168 hours. BNP (last 3 results) No results for input(s): PROBNP in the last 8760 hours. CBG: No results for input(s): GLUCAP in the last 168 hours. D-Dimer: No results for input(s): DDIMER in the last 72 hours. Hgb A1c: No results for input(s): HGBA1C in the last 72 hours. Lipid Profile: No results for input(s): CHOL, HDL, LDLCALC, TRIG, CHOLHDL, LDLDIRECT in the last 72 hours. Thyroid function studies: No results for input(s): TSH, T4TOTAL, T3FREE, THYROIDAB in the last 72 hours.  Invalid input(s): FREET3 Anemia work up: No results for input(s): VITAMINB12, FOLATE, FERRITIN, TIBC, IRON, RETICCTPCT in the last 72 hours. Sepsis Labs:  Recent Labs Lab 03/30/17 0421 03/31/17 0414 04/02/17 0357 04/03/17 0358  WBC 7.7 7.9 8.3 7.6   Microbiology No results found for this or any previous visit (from the past 240 hour(s)).   Medications:   . Chlorhexidine Gluconate Cloth  6 each Topical Once   And  . Chlorhexidine Gluconate Cloth  6 each Topical Once  . feeding supplement  1 Container Oral TID BM  . metroNIDAZOLE  1,000 mg Oral 3 times per day   And  . neomycin  1,000 mg Oral 3 times per day  . nicotine  21 mg Transdermal Daily   Continuous Infusions: . [START ON 04/04/2017] cefoTEtan (CEFOTAN) IV    . dextrose 5 % and 0.9 % NaCl with KCl 40 mEq/L Stopped (04/02/17 1646)      LOS: 5 days   Charlynne Cousins  [facility] Pager [phone]  *Please refer to [email or web address], password [email or web address] to get updated schedule on who will round on this patient, as hospitalists switch teams weekly. If 7PM-7AM, please contact night-coverage at [email or web address], password [email or web address] for any overnight needs.  04/03/2017, 10:40 AM

## 2017-04-04 ENCOUNTER — Inpatient Hospital Stay (HOSPITAL_COMMUNITY): Payer: Medicaid Other | Admitting: Certified Registered Nurse Anesthetist

## 2017-04-04 ENCOUNTER — Encounter (HOSPITAL_COMMUNITY)
Admission: EM | Disposition: A | Discharge status: Home / Self Care | Payer: Self-pay | Source: Home / Self Care | Attending: Internal Medicine

## 2017-04-04 ENCOUNTER — Inpatient Hospital Stay (HOSPITAL_COMMUNITY): Payer: Medicaid Other

## 2017-04-04 ENCOUNTER — Encounter: Payer: Self-pay | Admitting: General Practice

## 2017-04-04 ENCOUNTER — Encounter (HOSPITAL_COMMUNITY): Payer: Self-pay | Admitting: Certified Registered Nurse Anesthetist

## 2017-04-04 HISTORY — PX: CYSTOSCOPY WITH STENT PLACEMENT: SHX5790

## 2017-04-04 HISTORY — PX: COLON RESECTION: SHX5231

## 2017-04-04 LAB — CBC
HEMATOCRIT: 28.3 % — AB (ref 36.0–46.0)
HEMOGLOBIN: 8.9 g/dL — AB (ref 12.0–15.0)
MCH: 25.3 pg — AB (ref 26.0–34.0)
MCHC: 31.4 g/dL (ref 30.0–36.0)
MCV: 80.4 fL (ref 78.0–100.0)
Platelets: 428 10*3/uL — ABNORMAL HIGH (ref 150–400)
RBC: 3.52 MIL/uL — AB (ref 3.87–5.11)
RDW: 20.3 % — ABNORMAL HIGH (ref 11.5–15.5)
WBC: 8.5 10*3/uL (ref 4.0–10.5)

## 2017-04-04 LAB — CREATININE, SERUM
CREATININE: 0.71 mg/dL (ref 0.44–1.00)
GFR calc Af Amer: >60 mL/min (ref >60)
GFR calc non Af Amer: >60 mL/min (ref >60)

## 2017-04-04 LAB — SURGICAL PCR SCREEN
MRSA, PCR: NEGATIVE
Staphylococcus aureus: NEGATIVE

## 2017-04-04 SURGERY — COLON RESECTION
Anesthesia: General | Site: Ureter | Laterality: Right

## 2017-04-04 MED ORDER — MIDAZOLAM HCL 5 MG/5ML IJ SOLN
INTRAMUSCULAR | Status: DC | PRN
  Administered 2017-04-04: 2 mg via INTRAVENOUS

## 2017-04-04 MED ORDER — LIDOCAINE HCL (CARDIAC) 20 MG/ML IV SOLN
INTRAVENOUS | Status: DC | PRN
  Administered 2017-04-04: 50 mg via INTRATRACHEAL

## 2017-04-04 MED ORDER — ACETAMINOPHEN 650 MG RE SUPP: 650.0000 mg | Freq: Four times a day (QID) | RECTAL | Status: DC | PRN

## 2017-04-04 MED ORDER — ONDANSETRON 4 MG PO TBDP: 4.0000 mg | ORAL_TABLET | Freq: Four times a day (QID) | ORAL | Status: DC | PRN

## 2017-04-04 MED ORDER — KETAMINE HCL 10 MG/ML IJ SOLN
INTRAMUSCULAR | Status: DC | PRN
  Administered 2017-04-04: 10 mg via INTRAVENOUS
  Administered 2017-04-04: 20 mg via INTRAVENOUS

## 2017-04-04 MED ORDER — MIDAZOLAM HCL 2 MG/2ML IJ SOLN
INTRAMUSCULAR | Status: AC
  Filled 2017-04-04: qty 2

## 2017-04-04 MED ORDER — ENOXAPARIN SODIUM 40 MG/0.4ML ~~LOC~~ SOLN
40.0000 mg | SUBCUTANEOUS | Status: DC
  Administered 2017-04-05: 40 mg via SUBCUTANEOUS

## 2017-04-04 MED ORDER — SUGAMMADEX SODIUM 200 MG/2ML IV SOLN
INTRAVENOUS | Status: AC
  Filled 2017-04-04: qty 2

## 2017-04-04 MED ORDER — SUCCINYLCHOLINE CHLORIDE 20 MG/ML IJ SOLN
INTRAMUSCULAR | Status: DC | PRN
  Administered 2017-04-04: 100 mg via INTRAVENOUS

## 2017-04-04 MED ORDER — ALBUMIN HUMAN 5 % IV SOLN
INTRAVENOUS | Status: DC | PRN
  Administered 2017-04-04: 12:00:00 via INTRAVENOUS

## 2017-04-04 MED ORDER — ACETAMINOPHEN 325 MG PO TABS
650.0000 mg | ORAL_TABLET | Freq: Four times a day (QID) | ORAL | Status: DC | PRN
  Administered 2017-04-06: 650 mg via ORAL
  Filled 2017-04-04: qty 2

## 2017-04-04 MED ORDER — SODIUM CHLORIDE 0.9% FLUSH: 9.0000 mL | INTRAVENOUS | Status: DC | PRN

## 2017-04-04 MED ORDER — METOCLOPRAMIDE HCL 5 MG/ML IJ SOLN: 10.0000 mg | Freq: Once | INTRAMUSCULAR | Status: DC | PRN

## 2017-04-04 MED ORDER — DEXAMETHASONE SODIUM PHOSPHATE 10 MG/ML IJ SOLN
INTRAMUSCULAR | Status: DC | PRN
  Administered 2017-04-04: 10 mg via INTRAVENOUS

## 2017-04-04 MED ORDER — IOHEXOL 300 MG/ML  SOLN
INTRAMUSCULAR | Status: DC | PRN
  Administered 2017-04-04: 6 mL

## 2017-04-04 MED ORDER — KCL IN DEXTROSE-NACL 20-5-0.45 MEQ/L-%-% IV SOLN
INTRAVENOUS | Status: DC
  Administered 2017-04-04 – 2017-04-05 (×3): via INTRAVENOUS
  Filled 2017-04-04 (×4): qty 1000

## 2017-04-04 MED ORDER — DEXAMETHASONE SODIUM PHOSPHATE 10 MG/ML IJ SOLN
INTRAMUSCULAR | Status: AC
  Filled 2017-04-04: qty 1

## 2017-04-04 MED ORDER — DIPHENHYDRAMINE HCL 12.5 MG/5ML PO ELIX: 12.5000 mg | ORAL_SOLUTION | Freq: Four times a day (QID) | ORAL | Status: DC | PRN

## 2017-04-04 MED ORDER — PROPOFOL 10 MG/ML IV BOLUS
INTRAVENOUS | Status: AC
  Filled 2017-04-04: qty 20

## 2017-04-04 MED ORDER — FENTANYL CITRATE (PF) 250 MCG/5ML IJ SOLN
INTRAMUSCULAR | Status: AC
  Filled 2017-04-04: qty 5

## 2017-04-04 MED ORDER — 0.9 % SODIUM CHLORIDE (POUR BTL) OPTIME
TOPICAL | Status: DC | PRN
  Administered 2017-04-04: 3000 mL

## 2017-04-04 MED ORDER — SCOPOLAMINE 1 MG/3DAYS TD PT72
MEDICATED_PATCH | TRANSDERMAL | Status: AC
  Filled 2017-04-04: qty 1

## 2017-04-04 MED ORDER — ROCURONIUM BROMIDE 50 MG/5ML IV SOSY
PREFILLED_SYRINGE | INTRAVENOUS | Status: AC
  Filled 2017-04-04: qty 5

## 2017-04-04 MED ORDER — LACTATED RINGERS IV SOLN
INTRAVENOUS | Status: DC
  Administered 2017-04-04 (×4): via INTRAVENOUS

## 2017-04-04 MED ORDER — HYDROMORPHONE HCL-NACL 0.5-0.9 MG/ML-% IV SOSY
PREFILLED_SYRINGE | INTRAVENOUS | Status: AC
  Administered 2017-04-04: 0.5 mg
  Filled 2017-04-04: qty 3

## 2017-04-04 MED ORDER — HYDROMORPHONE HCL 1 MG/ML IJ SOLN
INTRAMUSCULAR | Status: DC | PRN
  Administered 2017-04-04 (×2): 1 mg via INTRAVENOUS

## 2017-04-04 MED ORDER — FENTANYL CITRATE (PF) 100 MCG/2ML IJ SOLN
INTRAMUSCULAR | Status: AC
  Filled 2017-04-04: qty 2

## 2017-04-04 MED ORDER — ROCURONIUM BROMIDE 100 MG/10ML IV SOLN
INTRAVENOUS | Status: DC | PRN
  Administered 2017-04-04: 10 mg via INTRAVENOUS
  Administered 2017-04-04: 40 mg via INTRAVENOUS
  Administered 2017-04-04: 10 mg via INTRAVENOUS
  Administered 2017-04-04: 20 mg via INTRAVENOUS

## 2017-04-04 MED ORDER — LACTATED RINGERS IV SOLN: INTRAVENOUS | Status: DC

## 2017-04-04 MED ORDER — SODIUM CHLORIDE 0.9 % IR SOLN
Status: DC | PRN
  Administered 2017-04-04: 3000 mL

## 2017-04-04 MED ORDER — ONDANSETRON HCL 4 MG/2ML IJ SOLN: 4.0000 mg | Freq: Four times a day (QID) | INTRAMUSCULAR | Status: DC | PRN

## 2017-04-04 MED ORDER — HYDROMORPHONE HCL 2 MG/ML IJ SOLN
INTRAMUSCULAR | Status: AC
  Filled 2017-04-04: qty 1

## 2017-04-04 MED ORDER — DIPHENHYDRAMINE HCL 50 MG/ML IJ SOLN: 12.5000 mg | Freq: Four times a day (QID) | INTRAMUSCULAR | Status: DC | PRN

## 2017-04-04 MED ORDER — SUCCINYLCHOLINE CHLORIDE 200 MG/10ML IV SOSY
PREFILLED_SYRINGE | INTRAVENOUS | Status: AC
  Filled 2017-04-04: qty 10

## 2017-04-04 MED ORDER — FENTANYL CITRATE (PF) 100 MCG/2ML IJ SOLN
25.0000 ug | INTRAMUSCULAR | Status: DC | PRN
  Administered 2017-04-04: 50 ug via INTRAVENOUS

## 2017-04-04 MED ORDER — ONDANSETRON HCL 4 MG/2ML IJ SOLN
INTRAMUSCULAR | Status: DC | PRN
  Administered 2017-04-04: 4 mg via INTRAVENOUS

## 2017-04-04 MED ORDER — FENTANYL CITRATE (PF) 250 MCG/5ML IJ SOLN
INTRAMUSCULAR | Status: DC | PRN
  Administered 2017-04-04 (×2): 50 ug via INTRAVENOUS
  Administered 2017-04-04: 100 ug via INTRAVENOUS
  Administered 2017-04-04: 50 ug via INTRAVENOUS

## 2017-04-04 MED ORDER — NALOXONE HCL 0.4 MG/ML IJ SOLN: 0.4000 mg | INTRAMUSCULAR | Status: DC | PRN

## 2017-04-04 MED ORDER — HYDROMORPHONE 1 MG/ML IV SOLN
INTRAVENOUS | Status: DC
  Administered 2017-04-04: 15:00:00 via INTRAVENOUS
  Administered 2017-04-04: 1.8 mg via INTRAVENOUS
  Administered 2017-04-04: 4.5 mg via INTRAVENOUS
  Administered 2017-04-05: 3.5 mg via INTRAVENOUS
  Administered 2017-04-05: 2.1 mg via INTRAVENOUS
  Administered 2017-04-05: 2.7 mg via INTRAVENOUS
  Administered 2017-04-05: 22:00:00 via INTRAVENOUS
  Administered 2017-04-05: 1.5 mg via INTRAVENOUS
  Administered 2017-04-05: 2.7 mg via INTRAVENOUS
  Administered 2017-04-05: 4.5 mg via INTRAVENOUS
  Administered 2017-04-05: 1.5 mg via INTRAVENOUS
  Administered 2017-04-06: 3.3 mg via INTRAVENOUS
  Administered 2017-04-06: 3.6 mg via INTRAVENOUS
  Filled 2017-04-04 (×2): qty 25

## 2017-04-04 MED ORDER — SUGAMMADEX SODIUM 200 MG/2ML IV SOLN
INTRAVENOUS | Status: DC | PRN
  Administered 2017-04-04: 100 mg via INTRAVENOUS

## 2017-04-04 MED ORDER — MEPERIDINE HCL 50 MG/ML IJ SOLN: 6.2500 mg | INTRAMUSCULAR | Status: DC | PRN

## 2017-04-04 MED ORDER — SCOPOLAMINE 1 MG/3DAYS TD PT72
MEDICATED_PATCH | TRANSDERMAL | Status: DC | PRN
  Administered 2017-04-04: 1 via TRANSDERMAL

## 2017-04-04 MED ORDER — PROPOFOL 10 MG/ML IV BOLUS
INTRAVENOUS | Status: DC | PRN
  Administered 2017-04-04: 160 mg via INTRAVENOUS

## 2017-04-04 MED ORDER — ONDANSETRON HCL 4 MG/2ML IJ SOLN
INTRAMUSCULAR | Status: AC
  Filled 2017-04-04: qty 2

## 2017-04-04 MED ORDER — LIDOCAINE 2% (20 MG/ML) 5 ML SYRINGE
INTRAMUSCULAR | Status: AC
  Filled 2017-04-04: qty 5

## 2017-04-04 MED ORDER — ALVIMOPAN 12 MG PO CAPS
12.0000 mg | ORAL_CAPSULE | ORAL | Status: AC
  Administered 2017-04-04: 12 mg via ORAL
  Filled 2017-04-04: qty 1

## 2017-04-04 SURGICAL SUPPLY — 57 items
ADAPTER GOLDBERG URETERAL (ADAPTER) ×3 IMPLANT
APPLICATOR COTTON TIP 6IN STRL (MISCELLANEOUS) ×6 IMPLANT
BAG URINE DRAINAGE (UROLOGICAL SUPPLIES) ×3 IMPLANT
BAG URO CATCHER STRL LF (MISCELLANEOUS) ×3 IMPLANT
BLADE EXTENDED COATED 6.5IN (ELECTRODE) ×3 IMPLANT
BLADE HEX COATED 2.75 (ELECTRODE) ×3 IMPLANT
CATH INTERMIT  6FR 70CM (CATHETERS) ×3 IMPLANT
CATH URET 5FR 28IN OPEN ENDED (CATHETERS) ×3 IMPLANT
CHLORAPREP W/TINT 26ML (MISCELLANEOUS) ×3 IMPLANT
CLIP VESOCCLUDE LG 6/CT (CLIP) IMPLANT
CLOTH BEACON ORANGE TIMEOUT ST (SAFETY) ×3 IMPLANT
COVER SURGICAL LIGHT HANDLE (MISCELLANEOUS) ×6 IMPLANT
DRAPE LAPAROSCOPIC ABDOMINAL (DRAPES) ×3 IMPLANT
DRSG OPSITE POSTOP 4X10 (GAUZE/BANDAGES/DRESSINGS) ×3 IMPLANT
DRSG OPSITE POSTOP 4X8 (GAUZE/BANDAGES/DRESSINGS) IMPLANT
ELECT REM PT RETURN 15FT ADLT (MISCELLANEOUS) ×3 IMPLANT
EVACUATOR DRAINAGE 10X20 100CC (DRAIN) IMPLANT
EVACUATOR SILICONE 100CC (DRAIN)
GAUZE SPONGE 4X4 12PLY STRL (GAUZE/BANDAGES/DRESSINGS) ×3 IMPLANT
GLOVE BIO SURGEON STRL SZ 6.5 (GLOVE) ×6 IMPLANT
GLOVE BIOGEL PI IND STRL 6 (GLOVE) ×6 IMPLANT
GLOVE BIOGEL PI IND STRL 6.5 (GLOVE) ×4 IMPLANT
GLOVE BIOGEL PI INDICATOR 6 (GLOVE) ×3
GLOVE BIOGEL PI INDICATOR 6.5 (GLOVE) ×2
GLOVE SURG ORTHO 8.0 STRL STRW (GLOVE) ×6 IMPLANT
GLOVE SURG SS PI 6.0 STRL IVOR (GLOVE) ×9 IMPLANT
GOWN STRL REUS W/TWL LRG LVL3 (GOWN DISPOSABLE) ×15 IMPLANT
GOWN STRL REUS W/TWL XL LVL3 (GOWN DISPOSABLE) ×9 IMPLANT
GUIDEWIRE STR DUAL SENSOR (WIRE) ×3 IMPLANT
LEGGING LITHOTOMY PAIR STRL (DRAPES) ×3 IMPLANT
MANIFOLD NEPTUNE II (INSTRUMENTS) ×3 IMPLANT
PACK COLON (CUSTOM PROCEDURE TRAY) ×3 IMPLANT
PACK CYSTO (CUSTOM PROCEDURE TRAY) ×3 IMPLANT
PACK GENERAL/GYN (CUSTOM PROCEDURE TRAY) ×3 IMPLANT
RELOAD PROXIMATE 75MM BLUE (ENDOMECHANICALS) ×6 IMPLANT
SEALER TISSUE X1 CVD JAW (INSTRUMENTS) ×3 IMPLANT
SHEARS HARMONIC ACE PLUS 36CM (ENDOMECHANICALS) ×3 IMPLANT
STAPLER GUN LINEAR PROX 60 (STAPLE) ×3 IMPLANT
STAPLER PROXIMATE 75MM BLUE (STAPLE) ×3 IMPLANT
STAPLER VISISTAT 35W (STAPLE) ×3 IMPLANT
SUT ETHILON 3 0 PS 1 (SUTURE) IMPLANT
SUT NOV 1 T60/GS (SUTURE) IMPLANT
SUT NOVA NAB DX-16 0-1 5-0 T12 (SUTURE) ×6 IMPLANT
SUT NOVA NAB GS-21 0 18 T12 DT (SUTURE) ×9 IMPLANT
SUT NOVA T20/GS 25 (SUTURE) IMPLANT
SUT SILK 2 0 (SUTURE)
SUT SILK 2 0 SH CR/8 (SUTURE) ×6 IMPLANT
SUT SILK 2 0SH CR/8 30 (SUTURE) IMPLANT
SUT SILK 2-0 18XBRD TIE 12 (SUTURE) IMPLANT
SUT SILK 2-0 30XBRD TIE 12 (SUTURE) IMPLANT
SUT SILK 3 0 (SUTURE) ×2
SUT SILK 3 0 SH CR/8 (SUTURE) ×3 IMPLANT
SUT SILK 3-0 18XBRD TIE 12 (SUTURE) ×4 IMPLANT
SUT VIC AB 4-0 SH 18 (SUTURE) IMPLANT
TRAY FOLEY CATH SILVER 14FR (SET/KITS/TRAYS/PACK) ×3 IMPLANT
TUBING CONNECTING 10 (TUBING) ×9 IMPLANT
YANKAUER SUCT BULB TIP NO VENT (SUCTIONS) ×3 IMPLANT

## 2017-04-04 NOTE — H&P (View-Only) (Signed)
Day of Surgery   Subjective/Chief Complaint:  1 - Large Pelvic Mass / Cecal Cancer - put undergoing open extriaption of large cecal tumor today by general surgery team. Rt ureter very close to expected plane of dissection. 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 General surgery team requests peri-op stent placement for ureteral protection / identification.  Today "Meagan [name]" is stable. She is ready for surgery today.   Objective: Vital signs in last 24 hours: Temp:  [98 F (36.7 C)-98.3 F (36.8 C)] 98.3 F (36.8 C) (08/03 0755) Pulse Rate:  [97-102] 101 (08/03 0755) Resp:  [16-17] 16 (08/03 0755) BP: (97-103)/(66-77) 97/77 (08/03 0755) SpO2:  [97 %-98 %] 98 % (08/02 2045) Last BM Date: 04/03/17  Intake/Output from previous day: 08/02 0701 - 08/03 0700 In: 1461.3 [P.O.:740; I.V.:721.3] Out: -  Intake/Output this shift: No intake/output data recorded.  General appearance: alert, cooperative, appears stated age and family at bedside.  Eyes: negative Nose: Nares normal. Septum midline. Mucosa normal. No drainage or sinus tenderness. Throat: lips, mucosa, and tongue normal; teeth and gums normal Back: symmetric, no curvature. ROM normal. No CVA tenderness. Resp: non-labored on room air.  GI: palpable pelvic / low abdominal fullness.  Extremities: extremities normal, atraumatic, no cyanosis or edema and stigmata of weight loss noted.  Pulses: 2+ and symmetric Neurologic: Grossly normal  Lab Results:   Recent Labs  04/02/17 0357 04/03/17 0358  WBC 8.3 7.6  HGB 8.0* 8.1*  HCT 25.0* 25.4*  PLT 371 371   BMET  Recent Labs  04/02/17 0357 04/03/17 0358  NA 140 139  K 3.2* 3.6  CL 108 107  CO2 25 27  GLUCOSE 88 84  BUN <5* <5*  CREATININE 0.46 0.49  CALCIUM 8.5* 8.7*   PT/INR  Recent Labs  04/03/17 0358  LABPROT 16.5*  INR 1.32   ABG No results for input(s): PHART, HCO3 in the last 72 hours.  Invalid input(s): PCO2, PO2  Studies/Results: Dg  Chest 2 View  Result Date: 04/02/2017 CLINICAL DATA:  Preop colon cancer.  No chest complaints. EXAM: CHEST  2 VIEW COMPARISON:  None. FINDINGS: The heart size and mediastinal contours are within normal limits. Both lungs are clear. The visualized skeletal structures are unremarkable. Mild rotoscoliosis convex RIGHT midthoracic region. IMPRESSION: No active cardiopulmonary disease. Electronically Signed   By: Staci Righter M.D.   On: 04/02/2017 11:40     CT abd/pelvis indepentantly reviewed.   Anti-infectives: Anti-infectives    Start     Dose/Rate Route Frequency Ordered Stop   04/04/17 0600  cefoTEtan (CEFOTAN) 2 g in dextrose 5 % 50 mL IVPB     2 g 100 mL/hr over 30 Minutes Intravenous On call to O.R. 04/02/17 1003 04/05/17 0559   04/03/17 1800  metroNIDAZOLE (FLAGYL) tablet 1,000 mg    Comments:  Plan surgery on Friday 8/3  Give this on 04/03/17   1,000 mg Oral 2 times per day on Thu 04/03/17 1508 04/03/17 2106   04/03/17 1800  neomycin (MYCIFRADIN) tablet 1,000 mg    Comments:  Surgery on 8/3 we want pt to get this on 04/03/17   1,000 mg Oral 2 times per day on Thu 04/03/17 1508 04/03/17 2106   04/03/17 1400  metroNIDAZOLE (FLAGYL) tablet 1,000 mg  Status:  Discontinued    Comments:  Plan surgery on Friday 8/3  Give this on 04/03/17   1,000 mg Oral 3 times per day 04/02/17 1007 04/03/17 1508   04/03/17 1400  neomycin (MYCIFRADIN) tablet 1,000 mg  Status:  Discontinued    Comments:  Surgery on 8/3 we want pt to get this on 04/03/17   1,000 mg Oral 3 times per day 04/02/17 1007 04/03/17 1508   04/02/17 1400  neomycin (MYCIFRADIN) tablet 1,000 mg  Status:  Discontinued     1,000 mg Oral 3 times per day 04/02/17 1003 04/02/17 1007   04/02/17 1400  metroNIDAZOLE (FLAGYL) tablet 1,000 mg  Status:  Discontinued     1,000 mg Oral 3 times per day 04/02/17 1003 04/02/17 1007   03/29/17 1300  cefoTEtan (CEFOTAN) 1 g in dextrose 5 % 50 mL IVPB  Status:  Discontinued     1 g 100 mL/hr over 30 Minutes  Intravenous Every 12 hours 03/29/17 0626 03/29/17 2238   03/29/17 1000  doxycycline (VIBRA-TABS) tablet 100 mg  Status:  Discontinued     100 mg Oral Every 12 hours 03/29/17 0620 03/29/17 2238   03/29/17 0700  cefoTEtan (CEFOTAN) 1 g in dextrose 5 % 50 mL IVPB  Status:  Discontinued     1 g 100 mL/hr over 30 Minutes Intravenous Every 12 hours 03/29/17 0620 03/29/17 0626   03/29/17 0245  cefOXitin (MEFOXIN) 2 g in dextrose 5 % 50 mL IVPB     2 g 100 mL/hr over 30 Minutes Intravenous  Once 03/29/17 0236 03/29/17 0339   03/29/17 0245  doxycycline (VIBRAMYCIN) 100 mg in dextrose 5 % 250 mL IVPB     100 mg 125 mL/hr over 120 Minutes Intravenous  Once 03/29/17 0236 03/29/17 0540   03/29/17 0242  doxycycline (VIBRAMYCIN) 100 MG injection    Comments:  Meagan [name]   : cabinet override      03/29/17 0242 03/29/17 1444   03/29/17 0230  cefoTEtan (CEFOTAN) 2 g in dextrose 5 % 50 mL IVPB  Status:  Discontinued     2 g 100 mL/hr over 30 Minutes Intravenous Every 12 hours 03/29/17 0229 03/29/17 0236      Assessment/Plan:  1 - Large Pelvic Mass / Cecal Cancer - Rec cysto, bilateral retrogrades, bilateral externalized stents given size and orientation of mass. She is in agreement. Risks, benefits, alternatives, expected peri-op course discussed.    [facility], Meagan [name] 04/04/2017

## 2017-04-04 NOTE — Progress Notes (Signed)
Came to see patient now in OR. Will try again in the afternoon. No medical problem, will ask surgery to take over.

## 2017-04-04 NOTE — Brief Op Note (Signed)
03/28/2017 - 04/04/2017  5:23 PM  PATIENT:  Meagan [name]  31 y.o. female  PRE-OPERATIVE DIAGNOSIS:  cecal mass possible cancer  POST-OPERATIVE DIAGNOSIS:  cecal mass possible cancer  PROCEDURE:  Cystocopy with bilateral retrograde pyleograms and bilateral ureteral stent placement  SURGEON:  Surgeon(s) and Role:      Alexis Frock, MD - Primary  PHYSICIAN ASSISTANT:   ASSISTANTS: none   ANESTHESIA:   general  EBL:  Total I/O In: 2450 [I.V.:2200; IV Piggyback:250] Out: 620 [Urine:320; Blood:300]  BLOOD ADMINISTERED:none  DRAINS: Rt ext ureteral stent (green), Lt ext ureteral stent (yellow), Foley all to gravity.    LOCAL MEDICATIONS USED:  NONE  SPECIMEN:  No Specimen  DISPOSITION OF SPECIMEN:  N/A  COUNTS:  YES  TOURNIQUET:  * No tourniquets in log *  DICTATION: .Other Dictation: Dictation Number [id number]  PLAN OF CARE: reamin in OR for general surgery portion of procedures  PATIENT DISPOSITION:  as per above.    Delay start of Pharmacological VTE agent (>24hrs) due to surgical blood loss or risk of bleeding: not applicable

## 2017-04-04 NOTE — Brief Op Note (Signed)
03/28/2017 - 04/04/2017  12:56 PM  PATIENT:  Meagan [name]  31 y.o. female  PRE-OPERATIVE DIAGNOSIS:  cecal mass possible cancer  POST-OPERATIVE DIAGNOSIS:  cecal mass possible cancer  PROCEDURE:  Procedure(s): OPEN RIGHT COLECTOMY AND RIGHT SALPINGO-OOPHORECTOMY (Right) CYSTOSCOPY WITH STENT PLACEMENT (Bilateral)  SURGEON:  Surgeon(s) and Role: Panel 1:    Armandina Gemma, MD - Primary  Panel 2:    * Alexis Frock, MD - Primary  PHYSICIAN ASSISTANT:   ASSISTANTS: Jackson Latino, PA-C   ANESTHESIA:   general  EBL:  Total I/O In: 2000 [I.V.:2000] Out: 550 [Urine:250; Blood:300]  BLOOD ADMINISTERED:none  DRAINS: none   LOCAL MEDICATIONS USED:  NONE  SPECIMEN:  Excision  DISPOSITION OF SPECIMEN:  PATHOLOGY  COUNTS:  YES  TOURNIQUET:  * No tourniquets in log *  DICTATION: .Other Dictation: Dictation Number [id number]  PLAN OF CARE: Admit to inpatient   PATIENT DISPOSITION:  PACU - hemodynamically stable.   Delay start of Pharmacological VTE agent (>24hrs) due to surgical blood loss or risk of bleeding: yes  Earnstine Regal, MD, [facility] Office: [phone]

## 2017-04-04 NOTE — Anesthesia Procedure Notes (Signed)
Procedure Name: Intubation Date/Time: 04/04/2017 10:05 AM Performed by: [place] Linley Moskal C Pre-anesthesia Checklist: Patient identified, Emergency Drugs available, Suction available and Patient being monitored Patient Re-evaluated:Patient Re-evaluated prior to induction Oxygen Delivery Method: Circle system utilized Preoxygenation: Pre-oxygenation with 100% oxygen Induction Type: IV induction Ventilation: Mask ventilation without difficulty Laryngoscope Size: Mac and 3 Grade View: Grade I Tube type: Oral Number of attempts: 1 Airway Equipment and Method: Stylet and Oral airway Placement Confirmation: ETT inserted through vocal cords under direct vision,  positive ETCO2 and breath sounds checked- equal and bilateral Tube secured with: Tape Dental Injury: Teeth and Oropharynx as per pre-operative assessment

## 2017-04-04 NOTE — Progress Notes (Signed)
Day of Surgery   Subjective/Chief Complaint:  1 - Large Pelvic Mass / Cecal Cancer - put undergoing open extriaption of large cecal tumor today by general surgery team. Rt ureter very close to expected plane of dissection. 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 General surgery team requests peri-op stent placement for ureteral protection / identification.  Today "Meagan [name]" is stable. She is ready for surgery today.   Objective: Vital signs in last 24 hours: Temp:  [98 F (36.7 C)-98.3 F (36.8 C)] 98.3 F (36.8 C) (08/03 0755) Pulse Rate:  [97-102] 101 (08/03 0755) Resp:  [16-17] 16 (08/03 0755) BP: (97-103)/(66-77) 97/77 (08/03 0755) SpO2:  [97 %-98 %] 98 % (08/02 2045) Last BM Date: 04/03/17  Intake/Output from previous day: 08/02 0701 - 08/03 0700 In: 1461.3 [P.O.:740; I.V.:721.3] Out: -  Intake/Output this shift: No intake/output data recorded.  General appearance: alert, cooperative, appears stated age and family at bedside.  Eyes: negative Nose: Nares normal. Septum midline. Mucosa normal. No drainage or sinus tenderness. Throat: lips, mucosa, and tongue normal; teeth and gums normal Back: symmetric, no curvature. ROM normal. No CVA tenderness. Resp: non-labored on room air.  GI: palpable pelvic / low abdominal fullness.  Extremities: extremities normal, atraumatic, no cyanosis or edema and stigmata of weight loss noted.  Pulses: 2+ and symmetric Neurologic: Grossly normal  Lab Results:   Recent Labs  04/02/17 0357 04/03/17 0358  WBC 8.3 7.6  HGB 8.0* 8.1*  HCT 25.0* 25.4*  PLT 371 371   BMET  Recent Labs  04/02/17 0357 04/03/17 0358  NA 140 139  K 3.2* 3.6  CL 108 107  CO2 25 27  GLUCOSE 88 84  BUN <5* <5*  CREATININE 0.46 0.49  CALCIUM 8.5* 8.7*   PT/INR  Recent Labs  04/03/17 0358  LABPROT 16.5*  INR 1.32   ABG No results for input(s): PHART, HCO3 in the last 72 hours.  Invalid input(s): PCO2, PO2  Studies/Results: Dg  Chest 2 View  Result Date: 04/02/2017 CLINICAL DATA:  Preop colon cancer.  No chest complaints. EXAM: CHEST  2 VIEW COMPARISON:  None. FINDINGS: The heart size and mediastinal contours are within normal limits. Both lungs are clear. The visualized skeletal structures are unremarkable. Mild rotoscoliosis convex RIGHT midthoracic region. IMPRESSION: No active cardiopulmonary disease. Electronically Signed   By: Staci Righter M.D.   On: 04/02/2017 11:40     CT abd/pelvis indepentantly reviewed.   Anti-infectives: Anti-infectives    Start     Dose/Rate Route Frequency Ordered Stop   04/04/17 0600  cefoTEtan (CEFOTAN) 2 g in dextrose 5 % 50 mL IVPB     2 g 100 mL/hr over 30 Minutes Intravenous On call to O.R. 04/02/17 1003 04/05/17 0559   04/03/17 1800  metroNIDAZOLE (FLAGYL) tablet 1,000 mg    Comments:  Plan surgery on Friday 8/3  Give this on 04/03/17   1,000 mg Oral 2 times per day on Thu 04/03/17 1508 04/03/17 2106   04/03/17 1800  neomycin (MYCIFRADIN) tablet 1,000 mg    Comments:  Surgery on 8/3 we want pt to get this on 04/03/17   1,000 mg Oral 2 times per day on Thu 04/03/17 1508 04/03/17 2106   04/03/17 1400  metroNIDAZOLE (FLAGYL) tablet 1,000 mg  Status:  Discontinued    Comments:  Plan surgery on Friday 8/3  Give this on 04/03/17   1,000 mg Oral 3 times per day 04/02/17 1007 04/03/17 1508   04/03/17 1400  neomycin (MYCIFRADIN) tablet 1,000 mg  Status:  Discontinued    Comments:  Surgery on 8/3 we want pt to get this on 04/03/17   1,000 mg Oral 3 times per day 04/02/17 1007 04/03/17 1508   04/02/17 1400  neomycin (MYCIFRADIN) tablet 1,000 mg  Status:  Discontinued     1,000 mg Oral 3 times per day 04/02/17 1003 04/02/17 1007   04/02/17 1400  metroNIDAZOLE (FLAGYL) tablet 1,000 mg  Status:  Discontinued     1,000 mg Oral 3 times per day 04/02/17 1003 04/02/17 1007   03/29/17 1300  cefoTEtan (CEFOTAN) 1 g in dextrose 5 % 50 mL IVPB  Status:  Discontinued     1 g 100 mL/hr over 30 Minutes  Intravenous Every 12 hours 03/29/17 0626 03/29/17 2238   03/29/17 1000  doxycycline (VIBRA-TABS) tablet 100 mg  Status:  Discontinued     100 mg Oral Every 12 hours 03/29/17 0620 03/29/17 2238   03/29/17 0700  cefoTEtan (CEFOTAN) 1 g in dextrose 5 % 50 mL IVPB  Status:  Discontinued     1 g 100 mL/hr over 30 Minutes Intravenous Every 12 hours 03/29/17 0620 03/29/17 0626   03/29/17 0245  cefOXitin (MEFOXIN) 2 g in dextrose 5 % 50 mL IVPB     2 g 100 mL/hr over 30 Minutes Intravenous  Once 03/29/17 0236 03/29/17 0339   03/29/17 0245  doxycycline (VIBRAMYCIN) 100 mg in dextrose 5 % 250 mL IVPB     100 mg 125 mL/hr over 120 Minutes Intravenous  Once 03/29/17 0236 03/29/17 0540   03/29/17 0242  doxycycline (VIBRAMYCIN) 100 MG injection    Comments:  Meagan [name]   : cabinet override      03/29/17 0242 03/29/17 1444   03/29/17 0230  cefoTEtan (CEFOTAN) 2 g in dextrose 5 % 50 mL IVPB  Status:  Discontinued     2 g 100 mL/hr over 30 Minutes Intravenous Every 12 hours 03/29/17 0229 03/29/17 0236      Assessment/Plan:  1 - Large Pelvic Mass / Cecal Cancer - Rec cysto, bilateral retrogrades, bilateral externalized stents given size and orientation of mass. She is in agreement. Risks, benefits, alternatives, expected peri-op course discussed.    Meagan [name], Meagan [name] 04/04/2017

## 2017-04-04 NOTE — Anesthesia Postprocedure Evaluation (Signed)
Anesthesia Post Note  Patient: Meagan [name]  Procedure(s) Performed: Procedure(s) (LRB): OPEN RIGHT COLECTOMY AND RIGHT SALPINGO-OOPHORECTOMY (Right) CYSTOSCOPY WITH STENT PLACEMENT (Bilateral)     Patient location during evaluation: PACU Anesthesia Type: General Level of consciousness: awake and alert Pain management: pain level controlled Vital Signs Assessment: post-procedure vital signs reviewed and stable Respiratory status: spontaneous breathing, nonlabored ventilation, respiratory function stable and patient connected to nasal cannula oxygen Cardiovascular status: blood pressure returned to baseline and stable Postop Assessment: no signs of nausea or vomiting Anesthetic complications: no    Last Vitals:  Vitals:   04/04/17 1345 04/04/17 1400  BP: 109/71 109/70  Pulse: (!) 105 100  Resp: 10 12  Temp:      Last Pain:  Vitals:   04/04/17 1342  TempSrc:   PainSc: 5                  Montez Hageman

## 2017-04-04 NOTE — Progress Notes (Signed)
  Oncology Nurse Navigator Documentation  Navigator Location: [facility] (04/03/17 1710)   )Navigator Encounter Type: Initial MedOnc (04/03/17 1710)  Mikki Harbor was met on inpatient unit today. The patient's husband was with patient during visit and actively participated in discussion. Introduction and role of navigation was discussed. This patient is aware of abnormal scan and potential for cancer diagnosis after surgery on 04/04/17. Patient's husband voiced financial concerns regarding treatment costs.  Orientation to cancer center specialities discussed for future management of her care, such as cancer patient support services and financial counseling.  Emotional support provided. Salvatrice and her husband were reassured that GI navigation would be available throughout Erminia's course of treatment. No further questions voiced.     Surgery Date: 04/04/17 (04/03/17 1710)       Multidisiplinary Clinic Date: 04/02/17 (04/03/17 1710) Multidisiplinary Clinic Type: GI (04/03/17 1710) Treatment Initiated Date: 04/04/17 (04/03/17 1710) Patient Visit Type: Inpatient (04/03/17 1710) Treatment Phase: Other (Pre Surgery) (04/03/17 1710) Barriers/Navigation Needs: Financial;Family concerns;Education;Coordination of Care (04/03/17 1710) Education: Other;Preparing for Upcoming Surgery/ Treatment (Abnormal scans) (04/03/17 1710) Interventions: Referrals;Education;Psycho-social support (04/03/17 1710) Referrals: Spiritual care (04/03/17 1710)   Education Method: Verbal (04/03/17 1710)      Acuity: Level 1 (04/03/17 1710) Acuity Level 1: Initial guidance, education and coordination as needed (04/03/17 1710)       Time Spent with Patient: 15 (04/03/17 1710)

## 2017-04-04 NOTE — Op Note (Signed)
NAME:  Meagan [name], Meagan [name]                  ACCOUNT NO.:  MEDICAL RECORD NO.:  [id number]  LOCATION:                                 FACILITY:  PHYSICIAN:  Earnstine Regal, MD      DATE OF BIRTH:  Sep 29, 1985  DATE OF PROCEDURE:  04/04/2017                              OPERATIVE REPORT   LOCATION:  [facility].  PREOPERATIVE DIAGNOSIS:  Locally advanced adenocarcinoma of the cecum.  POSTOPERATIVE DIAGNOSIS:  Locally advanced adenocarcinoma of the cecum.  PROCEDURE: 1. Open right colectomy. 2. Right salpingo-oophorectomy.  SURGEON:  Earnstine Regal, MD, FACS  ASSISTANT:  Jackson Latino, PA-C.  ANESTHESIA:  General.  ESTIMATED BLOOD LOSS:  300 mL.  PREPARATION:  ChloraPrep.  COMPLICATIONS:  None.  INDICATIONS:  The patient is a 31 year old female, diagnosed with adenocarcinoma, poorly differentiated, involving the cecum.  She has a locally advanced tumor, which appears to involve the abdominal wall, the retroperitoneum, and the right adnexa.  She now comes to Surgery for resection.  BODY OF REPORT:  Procedure was done in OR #1 at the [facility].  The patient was brought to the operating room, placed on the operating room table.  Following administration of general anesthesia, the patient was placed in lithotomy and Dr. Alexis Frock performed cystoscopy and bilateral stent placement.  Following this procedure, the patient was taken out of lithotomy.  The patient was moved to a supine position on the operating room table. The patient was then prepped and draped in the usual aseptic fashion. After ascertaining that an adequate level of general anesthesia had been achieved, a midline abdominal incision was made with a #10 blade. Dissection was carried through subcutaneous tissues.  Fascia was incised in the midline and the peritoneum was entered just below the level of the umbilicus.  Palpation reveals a large mass involving the  suprapubic and right lower quadrant of the abdomen.  The mass was adherent to the anterior abdominal wall and involves the peritoneal surfaces widely.  Incision was extended cephalad and caudad.  Under direct vision, the tumor was excised from the abdominal wall preserving the musculature, but resecting the adherent peritoneum.  Balfour retractor was then placed for exposure.  Tumor appears to involve the cecum.  It also appears to involve the right adnexal structures.  Tumor was densely adherent to the dome of the bladder and to the uterus.  There are large palpable lymph nodes in the mesentery.  Using gentle dissection, the tumor was mobilized from its peritoneal attachments.  The dome of the bladder was dissected off the surface of the tumor, although this was likely a positive surgical margin.  The right colon was mobilized along its lateral peritoneal attachments to provide for additional mobility.  The gonadal vessels were identified and divided with the Ethicon Enseal device and then ligated with 2-0 silk ties.  The mass was then mobilized away from the peritoneum of the right lower quadrant of the abdomen. Again, peritoneum was involved with tumor and was resected with the mass.  The terminal ileum was involved with tumor.  A point approximately 15 cm proximal to the ileocecal valve  was selected and transected with a GIA stapler.  Mesentery was divided with the Enseal. This allows for mobilization of the tumor and visualization of the uterus.  The tumor was separated by blunt dissection from the fundus of the uterus.  There was a positive margin here with involvement of the uterus by tumor.  There was a large mass in the right adnexa.  Decision was made to proceed with right salpingo-oophorectomy.  Fallopian tube was divided between [facility] clamps and suture ligated with 2-0 silk suture ligature.  The fallopian tube was then resected with the ovary from the lateral aspect of the  uterus using Kelly clamps along the vascular plexus and suture ligating these with interrupted 2-0 silk sutures.  The entire right tube and ovary were resected en bloc with the tumor.  The tumor was then fully mobilized.  Right colon was then fully mobilized around the hepatic flexure.  A point in the proximal transverse colon was selected and transected with a GIA stapler.  Care was taken to avoid the duodenum.  The right ureter was identified and the stent within the right ureter was easily palpable.  The right ureter was dissected away from the posterior aspect of the tumor, but may have a positive margin present.  There are large lymph nodes within the mesentery along the right colic artery.  Mesenteric dissection was carried down to the point of the last palpable lymph nodes and then the right colic artery was divided between [facility] clamps and suture ligated with 2-0 silk suture ligature so as to remove the grossly positive lymph nodes en bloc with the specimen.  Remainder of the mesentery was divided with the Ethicon Enseal and the entire specimen was then passed off the field and submitted fresh to Pathology for review.  Good hemostasis was noted throughout the operative field.  Next, a side- to-side functionally end-to-end anastomosis was created between the ileum and the proximal transverse colon.  This was performed in the usual fashion with a GIA stapler.  Enterotomy was closed with a TA-60 stapler.  Mesenteric defect was closed with interrupted 2-0 silk sutures.  At this point, gown and gloves were changed.  Drapes were changed.  The abdomen was then irrigated copiously with normal saline.  Good hemostasis was noted throughout the operative field.  Fluid was evacuated.  Omentum was used to cover the anastomosis.  Midline incision was closed with interrupted 0 Novafil simple sutures. Subcutaneous tissues were irrigated.  Skin was closed with stainless steel staples.  Sterile  dressings were applied.  The patient was awakened from anesthesia and brought to the recovery room in stable condition.  The patient tolerated the procedure well.   Earnstine Regal, MD, [facility] Office: [phone]    TMG/MEDQ  D:  04/04/2017  T:  04/04/2017  Job:  [id number]  cc:   Ladell Pier, M.D. Fax: [phone]

## 2017-04-04 NOTE — Progress Notes (Signed)
Meagan [name] Note  Referred by GI Navigator Meagan [name]/RN for emotional support to Meagan [name]'s husband Meagan [name] while pt was in surgery.  Initiated Spiritual [name] relationship with husband, mom, grandmother, BIL, and SIL in surgical waiting, explaining my role as part of the [facility] [facility] and [place] team.  Family was welcoming and shared about their practical concerns for couple (financial, Event organiser for [place], emotional for Meagan [name], childcare for three children, etc).  They are all still processing the logistical details and rush of feelings as they learn about next steps.  They are aware of ongoing chaplain availability at [facility] and [facility], and I plan to f/u in more detail next week.   Meagan [name], [place], [facility] M-F daytime pager [phone] [facility] 24/7 pager [phone] Voicemail [phone]

## 2017-04-04 NOTE — Progress Notes (Signed)
  Oncology Nurse Navigator Documentation  Navigator Location: [facility] (04/04/17 1400)   )Navigator Encounter Type: Other (Post-op visit) (04/04/17 1400)   Met with husband and extended family after patient met with surgeon.  General information about the cancer center and our program provided. Pt's husband had multiple questions regarding surgical issues and chemotherapy. Encouraged husband, Leroy Sea, to make a list of questions to address with surgeon and oncologist. Plan: Will check-in with patient and husband early next week.   Abnormal Finding Date: 03/31/17 (04/04/17 1400)                               Education Method: Verbal (04/04/17 1400)      Acuity: Level 1 (04/04/17 1400)         Time Spent with Patient: 15 (04/04/17 1400)

## 2017-04-04 NOTE — Anesthesia Preprocedure Evaluation (Signed)
Anesthesia Evaluation  Patient identified by MRN, date of birth, ID band Patient awake    Reviewed: Allergy & Precautions, NPO status , Patient's Chart, lab work & pertinent test results  Airway Mallampati: II  TM Distance: >3 FB Neck ROM: Full    Dental no notable dental hx.    Pulmonary Current Smoker,    Pulmonary exam normal breath sounds clear to auscultation       Cardiovascular negative cardio ROS Normal cardiovascular exam Rhythm:Regular Rate:Normal     Neuro/Psych negative neurological ROS  negative psych ROS   GI/Hepatic negative GI ROS, Neg liver ROS,   Endo/Other  negative endocrine ROS  Renal/GU negative Renal ROS  negative genitourinary   Musculoskeletal negative musculoskeletal ROS (+)   Abdominal   Peds negative pediatric ROS (+)  Hematology  (+) Blood dyscrasia (Hgb 8.1), anemia ,   Anesthesia Other Findings   Reproductive/Obstetrics negative OB ROS                             Anesthesia Physical Anesthesia Plan  ASA: II  Anesthesia Plan: General   Post-op Pain Management:    Induction: Intravenous  PONV Risk Score and Plan: 4 or greater and Ondansetron, Dexamethasone, Midazolam, Scopolamine patch - Pre-op and Treatment may vary due to age or medical condition  Airway Management Planned: Oral ETT  Additional Equipment:   Intra-op Plan:   Post-operative Plan: Extubation in OR  Informed Consent: I have reviewed the patients History and Physical, chart, labs and discussed the procedure including the risks, benefits and alternatives for the proposed anesthesia with the patient or authorized representative who has indicated his/her understanding and acceptance.   Dental advisory given  Plan Discussed with: CRNA  Anesthesia Plan Comments:         Anesthesia Quick Evaluation

## 2017-04-04 NOTE — Op Note (Signed)
Meagan Meagan [name], Meagan [name]             ACCOUNT NO.:  [id number]  MEDICAL RECORD NO.:  [id number]  LOCATION:  5366                         FACILITY:  [facility]  PHYSICIAN:  Alexis Frock, MD     DATE OF BIRTH:  06-08-1986  DATE OF PROCEDURE: 04/04/2017                              OPERATIVE REPORT   DIAGNOSIS:  Large pelvic mass, suspect locally-advanced GI source cancer.  PROCEDURES: 1. Cystoscopy with bilateral retrograde pyelograms and interpretation. 2. Insertion of bilateral ureteral stents.  ESTIMATED BLOOD LOSS:  Nil.  COMPLICATION:  None.  SPECIMEN:  None.  FINDINGS: 1. Unremarkable bilateral retrograde pyelograms. 2. Mass effect without invasion of dome of bladder likely from known     pelvic mass.  STENTS: 1. Right externalized Bander stent green in color. 2. Left externalized Bander stent yellow in color. 3. Foley catheter, all connected via Lyda Kalata adapter to gravity     drainage.  INDICATION:  Meagan Meagan [name] is an unfortunate 31 year old woman who on workup of pelvic pain, malaise and weight loss, was found to have a very large likely cecal primary cancer.  She is presenting today under the General Surgery Service for surgical extirpation of this.  Her actual imaging also reveals that her mass may be encroaching upon her right ureter.  There was also some pericystic vascularity around the area of the left ureter and the General Surgery team has requested bilateral stenting, preoperatively to aid in identification and protection of the ureters.  We evaluated the preoperative films and discussed with the patient and we are all in agreement that this is prudent.  Informed consent was obtained and placed in the medical record.  PROCEDURE IN DETAIL:  The patient being Meagan Meagan [name], was verified. Procedure being cysto, bilateral stent placement was confirmed. Procedure was carried out.  Time-out was performed.  Intravenous antibiotics were administered.  General  anesthesia introduced.  The patient initially placed into a low lithotomy position and sterile field was created by prepping and draping the patient's vagina, introitus and proximal thighs using iodine.  Cystourethroscopy was performed using a 22-French rigid cystoscope with offset lens.  Inspection of the urinary bladder revealed no diverticula, calcifications, papillary lesions. There was some mass effect at the area of the dome of the bladder likely consistent with extrinsic compression.  There was no obvious intraluminal invasion whatsoever.  The right ureteral orifice was cannulated with a 6-French end-hole catheter and right retrograde pyelogram was obtained.  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  There were no obvious filling defects or narrowing noted.  The 0.038 Zip wire was advanced to the level of the upper pole and the externalized catheter was advanced to the level of the upper pole, this was green in color.  Similarly, left retrograde pyelogram was obtained.  Left retrograde pyelogram demonstrated a single left ureter with single- system left kidney.  No obvious filling defects or narrowing noted. This was yellow in color and advanced to the level of the upper pole. The externalized stents were then set aside.  A Foley catheter was placed per urethra to straight drain and the externalized stents fashioned to this using a single silk tie on each  side and both the ureteral catheters and the Foley catheter were connected to straight drain via a Goldberg adapter.  The patient was then turned over to General Surgery Service for her extirpated portions of procedure today.          ______________________________ Alexis Frock, MD     TM/MEDQ  D:  04/04/2017  T:  04/04/2017  Job:  [id number]

## 2017-04-04 NOTE — Transfer of Care (Signed)
Immediate Anesthesia Transfer of Care Note  Patient: Meagan [name]  Procedure(s) Performed: Procedure(s): OPEN RIGHT COLECTOMY AND RIGHT SALPINGO-OOPHORECTOMY (Right) CYSTOSCOPY WITH STENT PLACEMENT (Bilateral)  Patient Location: PACU  Anesthesia Type:General  Level of Consciousness: awake and alert   Airway & Oxygen Therapy: Patient Spontanous Breathing and Patient connected to face mask oxygen  Post-op Assessment: Report given to RN and Post -op Vital signs reviewed and stable  Post vital signs: Reviewed and stable  Last Vitals:  Vitals:   04/03/17 2045 04/04/17 0755  BP: 98/74 97/77  Pulse: 97 (!) 101  Resp: 16 16  Temp: 36.7 C 36.8 C    Last Pain:  Vitals:   04/04/17 0755  TempSrc: Oral  PainSc:       Patients Stated Pain Goal: 2 (89/79/15 0413)  Complications: No apparent anesthesia complications

## 2017-04-04 NOTE — Progress Notes (Signed)
Patient refusing to turn to sides, claims  I was told to stay flat.Tried to repositioned with pillows but patient wants the pillow removed,post op teachings given to patient and family.

## 2017-04-04 NOTE — Progress Notes (Signed)
Patient in from PACU. Awake, alert and oriented, PCA set up.Made comfortable, rates post op pain 7/10. Honeycomb dressing intact with small amount of serosanguinous drainage at the bottom of the dressing.Foley cath draining some brownish and amber  Urine.

## 2017-04-04 NOTE — Interval H&P Note (Signed)
History and Physical Interval Note:  04/04/2017 9:46 AM  Meagan [name]  has presented today for surgery, with the diagnosis of cecal mass possible cancer  The various methods of treatment have been discussed with the patient and family. After consideration of risks, benefits and other options for treatment, the patient has consented to Partial colectomy AND cysto with bilateral retrogrades / bilateral stent placement as a surgical intervention .  The patient's history has been reviewed, patient examined, no change in status, stable for surgery.  I have reviewed the patient's chart and labs.  Questions were answered to the patient's satisfaction.     Patrcia Schnepp

## 2017-04-05 ENCOUNTER — Encounter (HOSPITAL_COMMUNITY): Payer: Self-pay | Admitting: Surgery

## 2017-04-05 DIAGNOSIS — R1903 Right lower quadrant abdominal swelling, mass and lump: Secondary | ICD-10-CM

## 2017-04-05 DIAGNOSIS — D473 Essential (hemorrhagic) thrombocythemia: Secondary | ICD-10-CM

## 2017-04-05 DIAGNOSIS — D75839 Thrombocytosis, unspecified: Secondary | ICD-10-CM

## 2017-04-05 DIAGNOSIS — C189 Malignant neoplasm of colon, unspecified: Secondary | ICD-10-CM

## 2017-04-05 DIAGNOSIS — D649 Anemia, unspecified: Secondary | ICD-10-CM

## 2017-04-05 LAB — BASIC METABOLIC PANEL
ANION GAP: 7 (ref 5–15)
BUN: <5 mg/dL — ABNORMAL LOW (ref 6–20)
CALCIUM: 8.5 mg/dL — AB (ref 8.9–10.3)
CO2: 28 mmol/L (ref 22–32)
CREATININE: 0.56 mg/dL (ref 0.44–1.00)
Chloride: 101 mmol/L (ref 101–111)
GFR calc Af Amer: >60 mL/min (ref >60)
GLUCOSE: 133 mg/dL — AB (ref 65–99)
Potassium: 4.3 mmol/L (ref 3.5–5.1)
Sodium: 136 mmol/L (ref 135–145)

## 2017-04-05 LAB — CBC
HCT: 25.4 % — ABNORMAL LOW (ref 36.0–46.0)
HEMOGLOBIN: 8.1 g/dL — AB (ref 12.0–15.0)
MCH: 26.1 pg (ref 26.0–34.0)
MCHC: 31.9 g/dL (ref 30.0–36.0)
MCV: 81.9 fL (ref 78.0–100.0)
PLATELETS: 403 10*3/uL — AB (ref 150–400)
RBC: 3.1 MIL/uL — ABNORMAL LOW (ref 3.87–5.11)
RDW: 20.1 % — AB (ref 11.5–15.5)
WBC: 11.2 10*3/uL — ABNORMAL HIGH (ref 4.0–10.5)

## 2017-04-05 MED ORDER — ENOXAPARIN SODIUM 30 MG/0.3ML ~~LOC~~ SOLN
30.0000 mg | SUBCUTANEOUS | Status: DC
  Administered 2017-04-06 – 2017-04-07 (×2): 30 mg via SUBCUTANEOUS
  Filled 2017-04-05 (×2): qty 0.3

## 2017-04-05 NOTE — Progress Notes (Signed)
PROGRESS NOTE  Meagan [name] RSW:[id number] DOB: 17-Oct-1985 DOA: 03/28/2017 PCP: Patient, No Pcp Per  Brief History:  31 year old female who initially presented to the [facility] on 03/28/2017 for management and evaluation of RLQ pain x 1 month.  The patient was initially treated with intravenous antibiotics at [facility] in June 2018.  7/27 pelvic ultrasound demonstrates a right ovary measuring 3 cm with an adjacent enlarged tubular mass. 7/28 CT ab/pelvis demonstrated a 10 cm necrotic mass in the right adnexal region suspicious for malignancy. Upon review of the patient's medical records, a CT abd/pelvis from 10/2016 demonstrated abnormalities related to her cecum along with adjacent adenopathy (see in care everywhere). GI and Gen surgery consulted.  Patient was transferred to [facility] on 03/29/17 under [facility] service for further work up. The patient on colonoscopy on 03/31/2017 which revealed an ulcerated cecal mass. Biopsies showed poorly differentiated adenocarcinoma. On 04/04/2017, the patient underwent ex laparotomy with open right colectomy and right salpingo-oophorectomy. General surgery team requested peri-op stent placement for ureteral protection / identification.  Urology was also consulted and assisted in placing bilateral ureteral stents secondary to mass effect of the tumor.  Assessment/Plan: Cecal adenocarcinoma -Presenting as right adnexal mass and pelvic ascites on 03/29/2017 CT abdomen/pelvis -Appreciate GI, general surgery, urology assistance -04/04/2017 Ex laparotomy with R-colectomy, right salpingo-oophorectomy and ostomy creation; bilateral ureteral stent placement -continue clear liquids per general surgery -CEA 13.0 -CA-125--39.4 -pain management per general surgery  Anemia of chronic disease -baaseline Hgb ~8-9 -transfuse for Hgb <7 -Iron saturation 3%, ferritin 29 -Serum K93--818 -Folic acid 29.9 -IV infed given 7/30 and 7/31 -start po iron once able to  tolerate po  Tobacco abuse -cessation discussed  Anxiety -continue prn lorazepam       Disposition Plan:   Home in 1-2 days  Family Communication:   Spouse and father updated at bedside 8/4  Consultants:  General surgery, urology, [facility]  Code Status:  FULL  DVT Prophylaxis: [place] Lovenox   Procedures: As Listed in Progress Note Above  Antibiotics: None    Subjective: Patient complains of abdominal pain that is controlled with PCA pump. Denies any nausea, vomiting, diarrhea. She is not passing flatus. There are no bowel movements yet. Denies any chest pain, shortness breath, coughing, hemoptysis, fevers, chills.  Objective: Vitals:   04/05/17 0800 04/05/17 0955 04/05/17 1200 04/05/17 1505  BP:  (!) 86/65  (!) 90/56  Pulse:  73  77  Resp: 16 16 14 15   Temp:  97.7 F (36.5 C)  97.8 F (36.6 C)  TempSrc:  Oral  Oral  SpO2: 100% 93% 100% 100%  Weight:      Height:        Intake/Output Summary (Last 24 hours) at 04/05/17 1618 Last data filed at 04/05/17 1514  Gross per 24 hour  Intake             1205 ml  Output             1250 ml  Net              -45 ml   Weight change:  Exam:   General:  Pt is alert, follows commands appropriately, not in acute distress  HEENT: No icterus, No thrush, No neck mass, [place]/AT  Cardiovascular: RRR, S1/S2, no rubs, no gallops  Respiratory: Bibasilar crackles. No wheeze. Good movement.  Abdomen: Soft/+BS, diffuse mild tender, non distended, no guarding  Extremities: No edema, No  lymphangitis, No petechiae, No rashes, no synovitis   Data Reviewed: I have personally reviewed following labs and imaging studies Basic Metabolic Panel:  Recent Labs Lab 03/30/17 0421 03/31/17 0414 04/02/17 0357 04/03/17 0358 04/04/17 1504 04/05/17 0406  NA 140 137 140 139  --  136  K 4.1 3.3* 3.2* 3.6  --  4.3  CL 107 106 108 107  --  101  CO2 26 22 25 27   --  28  GLUCOSE 88 91 88 84  --  133*  BUN <5* <5* <5* <5*  --  <5*    CREATININE 0.58 0.56 0.46 0.49 0.71 0.56  CALCIUM 8.5* 8.4* 8.5* 8.7*  --  8.5*  MG  --   --   --  1.8  --   --    Liver Function Tests:  Recent Labs Lab 04/02/17 0357 04/03/17 0358  AST 12* 13*  ALT 6* 6*  ALKPHOS 53 54  BILITOT 0.5 0.6  PROT 6.0* 6.4*  ALBUMIN 2.3* 2.5*   No results for input(s): LIPASE, AMYLASE in the last 168 hours. No results for input(s): AMMONIA in the last 168 hours. Coagulation Profile:  Recent Labs Lab 04/03/17 0358  INR 1.32   CBC:  Recent Labs Lab 03/31/17 0414 04/02/17 0357 04/03/17 0358 04/04/17 1504 04/05/17 0406  WBC 7.9 8.3 7.6 8.5 11.2*  NEUTROABS  --   --  4.2  --   --   HGB 8.1* 8.0* 8.1* 8.9* 8.1*  HCT 26.1* 25.0* 25.4* 28.3* 25.4*  MCV 81.3 80.6 81.2 80.4 81.9  PLT 443* 371 371 428* 403*   Cardiac Enzymes: No results for input(s): CKTOTAL, CKMB, CKMBINDEX, TROPONINI in the last 168 hours. BNP: Invalid input(s): POCBNP CBG: No results for input(s): GLUCAP in the last 168 hours. HbA1C: No results for input(s): HGBA1C in the last 72 hours. Urine analysis:    Component Value Date/Time   COLORURINE YELLOW 03/28/2017 2310   APPEARANCEUR CLOUDY (A) 03/28/2017 2310   LABSPEC 1.008 03/28/2017 2310   PHURINE 7.5 03/28/2017 2310   GLUCOSEU NEGATIVE 03/28/2017 2310   HGBUR NEGATIVE 03/28/2017 2310   BILIRUBINUR NEGATIVE 03/28/2017 2310   KETONESUR NEGATIVE 03/28/2017 2310   PROTEINUR NEGATIVE 03/28/2017 2310   NITRITE NEGATIVE 03/28/2017 2310   LEUKOCYTESUR SMALL (A) 03/28/2017 2310   Sepsis Labs: @LABRCNTIP (procalcitonin:4,lacticidven:4) ) Recent Results (from the past 240 hour(s))  Surgical PCR screen     Status: None   Collection Time: 04/04/17  7:22 AM  Result Value Ref Range Status   MRSA, PCR NEGATIVE NEGATIVE Final   Staphylococcus aureus NEGATIVE NEGATIVE Final    Comment:        The Xpert SA Assay (FDA approved for NASAL specimens in patients over 92 years of age), is one component of a comprehensive  surveillance program.  Test performance has been validated by [facility] for patients greater than or equal to 68 year old. It is not intended to diagnose infection nor to guide or monitor treatment.      Scheduled Meds: . [START ON 04/06/2017] enoxaparin (LOVENOX) injection  30 mg Subcutaneous Q24H  . HYDROmorphone   Intravenous Q4H   Continuous Infusions: . dextrose 5 % and 0.45 % NaCl with KCl 20 mEq/L 100 mL/hr at 04/05/17 1520    Procedures/Studies: Dg Chest 2 View  Result Date: 04/02/2017 CLINICAL DATA:  Preop colon cancer.  No chest complaints. EXAM: CHEST  2 VIEW COMPARISON:  None. FINDINGS: The heart size and mediastinal contours are within normal  limits. Both lungs are clear. The visualized skeletal structures are unremarkable. Mild rotoscoliosis convex RIGHT midthoracic region. IMPRESSION: No active cardiopulmonary disease. Electronically Signed   By: Staci Righter M.D.   On: 04/02/2017 11:40   US Transvaginal Non-ob  Result Date: 03/29/2017 CLINICAL DATA:  31 y/o F; right lower quadrant pain with a palpable lump for 4 days. History of right fallopian tube infection in June. EXAM: TRANSABDOMINAL AND TRANSVAGINAL ULTRASOUND OF PELVIS TECHNIQUE: Both transabdominal and transvaginal ultrasound examinations of the pelvis were performed. Transabdominal technique was performed for global imaging of the pelvis including uterus, ovaries, adnexal regions, and pelvic cul-de-sac. It was necessary to proceed with endovaginal exam following the transabdominal exam to visualize the uterus and bilateral adnexa. COMPARISON:  None FINDINGS: Uterus Measurements: 5.8 x 2.8 x 4.6 cm. No fibroids or other mass visualized. Retroverted. Endometrium Thickness: 4 mm.  No focal abnormality visualized. Right ovary Measurements: 2.9 x 2.2 x 1.7 cm. Limited visibility of the right ovary, no gross abnormality. Tubular structure within the right adnexa isoechoic to the uterus with multiple small echogenic foci in  the wall "Beads on a string" sign. The tubular structure does not demonstrate peristalsis during the ultrasound examination and there is no bowel wall echo signature. Left ovary Measurements: 2.2 x 1.5 x 1.7 cm. 1.2 cm simple ovarian follicle. Other findings Small volume of fluid in the pelvis. IMPRESSION: Right adnexal aperistaltic tubular structure containing complex fluid. Findings probably represent pyosalpinx given history of prior fallopian tube infection. Echogenic foci within the wall is an indicator of chronic inflammation. Electronically Signed   By: Kristine Garbe M.D.   On: 03/29/2017 00:29   US Pelvis Complete  Result Date: 03/29/2017 CLINICAL DATA:  31 y/o F; right lower quadrant pain with a palpable lump for 4 days. History of right fallopian tube infection in June. EXAM: TRANSABDOMINAL AND TRANSVAGINAL ULTRASOUND OF PELVIS TECHNIQUE: Both transabdominal and transvaginal ultrasound examinations of the pelvis were performed. Transabdominal technique was performed for global imaging of the pelvis including uterus, ovaries, adnexal regions, and pelvic cul-de-sac. It was necessary to proceed with endovaginal exam following the transabdominal exam to visualize the uterus and bilateral adnexa. COMPARISON:  None FINDINGS: Uterus Measurements: 5.8 x 2.8 x 4.6 cm. No fibroids or other mass visualized. Retroverted. Endometrium Thickness: 4 mm.  No focal abnormality visualized. Right ovary Measurements: 2.9 x 2.2 x 1.7 cm. Limited visibility of the right ovary, no gross abnormality. Tubular structure within the right adnexa isoechoic to the uterus with multiple small echogenic foci in the wall "Beads on a string" sign. The tubular structure does not demonstrate peristalsis during the ultrasound examination and there is no bowel wall echo signature. Left ovary Measurements: 2.2 x 1.5 x 1.7 cm. 1.2 cm simple ovarian follicle. Other findings Small volume of fluid in the pelvis. IMPRESSION: Right  adnexal aperistaltic tubular structure containing complex fluid. Findings probably represent pyosalpinx given history of prior fallopian tube infection. Echogenic foci within the wall is an indicator of chronic inflammation. Electronically Signed   By: Kristine Garbe M.D.   On: 03/29/2017 00:29   Ct Abdomen Pelvis W Contrast  Result Date: 03/29/2017 CLINICAL DATA:  Abdominal pain for 6 weeks. History of tubo-ovarian abscess. EXAM: CT ABDOMEN AND PELVIS WITH CONTRAST TECHNIQUE: Multidetector CT imaging of the abdomen and pelvis was performed using the standard protocol following bolus administration of intravenous contrast. CONTRAST:  78mL ISOVUE-300 IOPAMIDOL (ISOVUE-300) INJECTION 61% COMPARISON:  None. FINDINGS: Lower chest: No acute abnormality. Hepatobiliary: No  focal liver abnormality is seen. No gallstones, gallbladder wall thickening, or biliary dilatation. Pancreas: Unremarkable. No pancreatic ductal dilatation or surrounding inflammatory changes. Spleen: Normal in size without focal abnormality. Adrenals/Urinary Tract: The adrenal glands are normal. Kidneys are unremarkable. Unremarkable appearance of the urinary bladder. Stomach/Bowel: The stomach is normal. No pathologic dilatation of the large or small bowel loops. Vascular/Lymphatic: Normal appearance of the abdominal aorta. At the level of the aortic bifurcation there is a right common iliac node measuring 1.4 cm, image 38 of series 2. Adjacent enlarged lymph node measures 1.1 cm, image 40 of series 2. Reproductive: The uterus is grossly unremarkable. Other: Within the right iliac fossa there is a large necrotic mass with thick enhancing rim. The mass measures 10.3 cm, image number 32 of series 602. It is unclear whether not this mass is arising from the right adnexa or from the cecum. There is S suture material associated with this mass which is of on certain etiology. Multiple smaller surrounding masses are identified. For example  within the right lower quadrant mesenteric there is a 2.6 cm solid mass, image 48 of series 2. Areas of loculated ascites are identified within the dependent portion of the pelvis. Musculoskeletal: No acute or significant osseous findings. IMPRESSION: 1. There is a large thick walled necrotic mass within the right lower quadrant of the abdomen centered around the right iliac fossa. This measures up to 10 cm in length. Highly suspicious for malignancy. Origin is indeterminate. This may be arising from the cecum or right adnexa. At this time there is no evidence for bowel obstruction or obstructive uropathy. 2. 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 3. Loculated ascites within the pelvis. Electronically Signed   By: Kerby Moors M.D.   On: 03/29/2017 10:18   Dg C-arm 1-60 Min-no Report  Result Date: 04/04/2017 Fluoroscopy was utilized by the requesting physician.  No radiographic interpretation.    Tayshaun Kroh, DO  [facility] Pager [phone]  If 7PM-7AM, please contact night-coverage [email or web address] Password [email or web address] 04/05/2017, 4:18 PM   LOS: 7 days

## 2017-04-05 NOTE — Progress Notes (Signed)
Progress Note: General Surgery Service   Assessment/Plan: Patient Active Problem List   Diagnosis Date Noted  . Tubo-ovarian abscess 03/29/2017  . Right pyosalpinx 03/29/2017  . Right lower quadrant abdominal mass 03/29/2017  . Anemia 03/29/2017   s/p Procedure(s): OPEN RIGHT COLECTOMY AND RIGHT SALPINGO-OOPHORECTOMY CYSTOSCOPY WITH STENT PLACEMENT 04/04/2017 -clear liquids -up to a chair    LOS: 7 days  Chief Complaint/Subjective: No nausea, thirsty, sore on right side  Objective: Vital signs in last 24 hours: Temp:  [97.5 F (36.4 C)-98.3 F (36.8 C)] 98.3 F (36.8 C) (08/04 0447) Pulse Rate:  [61-106] 97 (08/04 0447) Resp:  [6-24] 16 (08/04 0800) BP: (97-120)/(64-78) 97/68 (08/04 0447) SpO2:  [96 %-100 %] 100 % (08/04 0800) FiO2 (%):  [28 %] 28 % (08/04 0800) Last BM Date: 04/02/17  Intake/Output from previous day: 08/03 0701 - 08/04 0700 In: 3415 [I.V.:3165; IV Piggyback:250] Out: 1870 [Urine:1570; Blood:300] Intake/Output this shift: No intake/output data recorded.  Lungs: CTAB  Cardiovascular: RRR  Abd: soft, ATTP, nondistended, incision c/d/i  Extremities: no edema  Neuro: AOx4  Lab Results: CBC   Recent Labs  04/04/17 1504 04/05/17 0406  WBC 8.5 11.2*  HGB 8.9* 8.1*  HCT 28.3* 25.4*  PLT 428* 403*   BMET  Recent Labs  04/03/17 0358 04/04/17 1504 04/05/17 0406  NA 139  --  136  K 3.6  --  4.3  CL 107  --  101  CO2 27  --  28  GLUCOSE 84  --  133*  BUN <5*  --  <5*  CREATININE 0.49 0.71 0.56  CALCIUM 8.7*  --  8.5*   PT/INR  Recent Labs  04/03/17 0358  LABPROT 16.5*  INR 1.32   ABG No results for input(s): PHART, HCO3 in the last 72 hours.  Invalid input(s): PCO2, PO2  Studies/Results:  Anti-infectives: Anti-infectives    Start     Dose/Rate Route Frequency Ordered Stop   04/04/17 0600  cefoTEtan (CEFOTAN) 2 g in dextrose 5 % 50 mL IVPB     2 g 100 mL/hr over 30 Minutes Intravenous On call to O.R. 04/02/17 1003  04/04/17 1038   04/03/17 1800  metroNIDAZOLE (FLAGYL) tablet 1,000 mg    Comments:  Plan surgery on Friday 8/3  Give this on 04/03/17   1,000 mg Oral 2 times per day on Thu 04/03/17 1508 04/03/17 2106   04/03/17 1800  neomycin (MYCIFRADIN) tablet 1,000 mg    Comments:  Surgery on 8/3 we want pt to get this on 04/03/17   1,000 mg Oral 2 times per day on Thu 04/03/17 1508 04/03/17 2106   04/03/17 1400  metroNIDAZOLE (FLAGYL) tablet 1,000 mg  Status:  Discontinued    Comments:  Plan surgery on Friday 8/3  Give this on 04/03/17   1,000 mg Oral 3 times per day 04/02/17 1007 04/03/17 1508   04/03/17 1400  neomycin (MYCIFRADIN) tablet 1,000 mg  Status:  Discontinued    Comments:  Surgery on 8/3 we want pt to get this on 04/03/17   1,000 mg Oral 3 times per day 04/02/17 1007 04/03/17 1508   04/02/17 1400  neomycin (MYCIFRADIN) tablet 1,000 mg  Status:  Discontinued     1,000 mg Oral 3 times per day 04/02/17 1003 04/02/17 1007   04/02/17 1400  metroNIDAZOLE (FLAGYL) tablet 1,000 mg  Status:  Discontinued     1,000 mg Oral 3 times per day 04/02/17 1003 04/02/17 1007   03/29/17 1300  cefoTEtan (  CEFOTAN) 1 g in dextrose 5 % 50 mL IVPB  Status:  Discontinued     1 g 100 mL/hr over 30 Minutes Intravenous Every 12 hours 03/29/17 0626 03/29/17 2238   03/29/17 1000  doxycycline (VIBRA-TABS) tablet 100 mg  Status:  Discontinued     100 mg Oral Every 12 hours 03/29/17 0620 03/29/17 2238   03/29/17 0700  cefoTEtan (CEFOTAN) 1 g in dextrose 5 % 50 mL IVPB  Status:  Discontinued     1 g 100 mL/hr over 30 Minutes Intravenous Every 12 hours 03/29/17 0620 03/29/17 0626   03/29/17 0245  cefOXitin (MEFOXIN) 2 g in dextrose 5 % 50 mL IVPB     2 g 100 mL/hr over 30 Minutes Intravenous  Once 03/29/17 0236 03/29/17 0339   03/29/17 0245  doxycycline (VIBRAMYCIN) 100 mg in dextrose 5 % 250 mL IVPB     100 mg 125 mL/hr over 120 Minutes Intravenous  Once 03/29/17 0236 03/29/17 0540   03/29/17 0242  doxycycline (VIBRAMYCIN) 100  MG injection    Comments:  [name]   : cabinet override      03/29/17 0242 03/29/17 1444   03/29/17 0230  cefoTEtan (CEFOTAN) 2 g in dextrose 5 % 50 mL IVPB  Status:  Discontinued     2 g 100 mL/hr over 30 Minutes Intravenous Every 12 hours 03/29/17 0229 03/29/17 0236      Medications: Scheduled Meds: . enoxaparin (LOVENOX) injection  40 mg Subcutaneous Q24H  . HYDROmorphone   Intravenous Q4H   Continuous Infusions: . dextrose 5 % and 0.45 % NaCl with KCl 20 mEq/L 100 mL/hr at 04/05/17 0355   PRN Meds:.acetaminophen **OR** acetaminophen, diphenhydrAMINE **OR** diphenhydrAMINE, naloxone **AND** sodium chloride flush, ondansetron **OR** ondansetron (ZOFRAN) IV, ondansetron (ZOFRAN) IV  Mickeal Skinner, MD Pg# [phone] [facility]

## 2017-04-06 DIAGNOSIS — Z720 Tobacco use: Secondary | ICD-10-CM

## 2017-04-06 LAB — CBC
HEMATOCRIT: 25.3 % — AB (ref 36.0–46.0)
Hemoglobin: 7.8 g/dL — ABNORMAL LOW (ref 12.0–15.0)
MCH: 25.8 pg — AB (ref 26.0–34.0)
MCHC: 30.8 g/dL (ref 30.0–36.0)
MCV: 83.8 fL (ref 78.0–100.0)
PLATELETS: 343 10*3/uL (ref 150–400)
RBC: 3.02 MIL/uL — ABNORMAL LOW (ref 3.87–5.11)
RDW: 19.8 % — AB (ref 11.5–15.5)
WBC: 5.3 10*3/uL (ref 4.0–10.5)

## 2017-04-06 LAB — BASIC METABOLIC PANEL
Anion gap: 6 (ref 5–15)
CHLORIDE: 103 mmol/L (ref 101–111)
CO2: 29 mmol/L (ref 22–32)
Calcium: 8.4 mg/dL — ABNORMAL LOW (ref 8.9–10.3)
Creatinine, Ser: 0.49 mg/dL (ref 0.44–1.00)
GFR calc Af Amer: >60 mL/min (ref >60)
GFR calc non Af Amer: >60 mL/min (ref >60)
GLUCOSE: 139 mg/dL — AB (ref 65–99)
POTASSIUM: 5 mmol/L (ref 3.5–5.1)
Sodium: 138 mmol/L (ref 135–145)

## 2017-04-06 MED ORDER — HYDROMORPHONE HCL-NACL 0.5-0.9 MG/ML-% IV SOSY
0.5000 mg | PREFILLED_SYRINGE | INTRAVENOUS | Status: DC | PRN
  Administered 2017-04-06 – 2017-04-07 (×2): 0.5 mg via INTRAVENOUS
  Filled 2017-04-06 (×2): qty 1

## 2017-04-06 MED ORDER — IBUPROFEN 200 MG PO TABS
600.0000 mg | ORAL_TABLET | Freq: Four times a day (QID) | ORAL | Status: DC | PRN
  Administered 2017-04-07 (×2): 600 mg via ORAL
  Filled 2017-04-06 (×3): qty 3

## 2017-04-06 MED ORDER — OXYCODONE HCL 5 MG PO TABS
5.0000 mg | ORAL_TABLET | ORAL | Status: DC | PRN
  Administered 2017-04-06 (×3): 5 mg via ORAL
  Filled 2017-04-06 (×3): qty 1

## 2017-04-06 MED ORDER — FERROUS SULFATE 325 (65 FE) MG PO TABS
325.0000 mg | ORAL_TABLET | Freq: Every day | ORAL | Status: DC
  Administered 2017-04-07: 325 mg via ORAL
  Filled 2017-04-06: qty 1

## 2017-04-06 MED ORDER — OXYCODONE HCL 5 MG PO TABS: 5.0000 mg | ORAL_TABLET | Freq: Four times a day (QID) | ORAL | Status: DC | PRN

## 2017-04-06 MED ORDER — LORAZEPAM 0.5 MG PO TABS
0.5000 mg | ORAL_TABLET | Freq: Four times a day (QID) | ORAL | Status: DC | PRN
  Administered 2017-04-06 – 2017-04-07 (×2): 1 mg via ORAL
  Filled 2017-04-06 (×2): qty 2

## 2017-04-06 MED ORDER — LORAZEPAM 0.5 MG PO TABS: 0.5000 mg | ORAL_TABLET | Freq: Four times a day (QID) | ORAL | Status: DC | PRN

## 2017-04-06 NOTE — Progress Notes (Signed)
Progress Note: General Surgery Service   Assessment/Plan: Patient Active Problem List   Diagnosis Date Noted  . Adenocarcinoma, colon ([place]) 04/05/2017  . Normochromic normocytic anemia   . Thrombocytosis ([place])   . Tubo-ovarian abscess 03/29/2017  . Right pyosalpinx 03/29/2017  . Right lower quadrant abdominal mass 03/29/2017  . Anemia 03/29/2017   s/p Procedure(s): OPEN RIGHT COLECTOMY AND RIGHT SALPINGO-OOPHORECTOMY CYSTOSCOPY WITH STENT PLACEMENT 04/04/2017 -remove foley -dc pca -oral pain meds -advance diet -ambulate    LOS: 8 days  Chief Complaint/Subjective: Pain much improved, positive flatus, difficulty getting to chair yesterday  Objective: Vital signs in last 24 hours: Temp:  [97.7 F (36.5 C)-98.2 F (36.8 C)] 98.2 F (36.8 C) (08/04 2154) Pulse Rate:  [73-88] 85 (08/04 2154) Resp:  [14-20] 20 (08/05 0343) BP: (85-104)/(56-67) 104/67 (08/04 2154) SpO2:  [93 %-100 %] 99 % (08/05 0343) FiO2 (%):  [28 %] 28 % (08/04 1600) Last BM Date: 04/01/17  Intake/Output from previous day: 08/04 0701 - 08/05 0700 In: 480 [P.O.:480] Out: 2125 [Urine:2125] Intake/Output this shift: No intake/output data recorded.  Lungs: CTAB  Cardiovascular: RRR  Abd: soft, ATTP, ND, incision c/d/i  Extremities: no edema  Neuro: AOx4  Lab Results: CBC   Recent Labs  04/05/17 0406 04/06/17 0341  WBC 11.2* 5.3  HGB 8.1* 7.8*  HCT 25.4* 25.3*  PLT 403* 343   BMET  Recent Labs  04/05/17 0406 04/06/17 0341  NA 136 138  K 4.3 5.0  CL 101 103  CO2 28 29  GLUCOSE 133* 139*  BUN <5* <5*  CREATININE 0.56 0.49  CALCIUM 8.5* 8.4*   PT/INR No results for input(s): LABPROT, INR in the last 72 hours. ABG No results for input(s): PHART, HCO3 in the last 72 hours.  Invalid input(s): PCO2, PO2  Studies/Results:  Anti-infectives: Anti-infectives    Start     Dose/Rate Route Frequency Ordered Stop   04/04/17 0600  cefoTEtan (CEFOTAN) 2 g in dextrose 5 % 50 mL IVPB      2 g 100 mL/hr over 30 Minutes Intravenous On call to O.R. 04/02/17 1003 04/04/17 1038   04/03/17 1800  metroNIDAZOLE (FLAGYL) tablet 1,000 mg    Comments:  Plan surgery on Friday 8/3  Give this on 04/03/17   1,000 mg Oral 2 times per day on Thu 04/03/17 1508 04/03/17 2106   04/03/17 1800  neomycin (MYCIFRADIN) tablet 1,000 mg    Comments:  Surgery on 8/3 we want pt to get this on 04/03/17   1,000 mg Oral 2 times per day on Thu 04/03/17 1508 04/03/17 2106   04/03/17 1400  metroNIDAZOLE (FLAGYL) tablet 1,000 mg  Status:  Discontinued    Comments:  Plan surgery on Friday 8/3  Give this on 04/03/17   1,000 mg Oral 3 times per day 04/02/17 1007 04/03/17 1508   04/03/17 1400  neomycin (MYCIFRADIN) tablet 1,000 mg  Status:  Discontinued    Comments:  Surgery on 8/3 we want pt to get this on 04/03/17   1,000 mg Oral 3 times per day 04/02/17 1007 04/03/17 1508   04/02/17 1400  neomycin (MYCIFRADIN) tablet 1,000 mg  Status:  Discontinued     1,000 mg Oral 3 times per day 04/02/17 1003 04/02/17 1007   04/02/17 1400  metroNIDAZOLE (FLAGYL) tablet 1,000 mg  Status:  Discontinued     1,000 mg Oral 3 times per day 04/02/17 1003 04/02/17 1007   03/29/17 1300  cefoTEtan (CEFOTAN) 1 g in dextrose 5 %  50 mL IVPB  Status:  Discontinued     1 g 100 mL/hr over 30 Minutes Intravenous Every 12 hours 03/29/17 0626 03/29/17 2238   03/29/17 1000  doxycycline (VIBRA-TABS) tablet 100 mg  Status:  Discontinued     100 mg Oral Every 12 hours 03/29/17 0620 03/29/17 2238   03/29/17 0700  cefoTEtan (CEFOTAN) 1 g in dextrose 5 % 50 mL IVPB  Status:  Discontinued     1 g 100 mL/hr over 30 Minutes Intravenous Every 12 hours 03/29/17 0620 03/29/17 0626   03/29/17 0245  cefOXitin (MEFOXIN) 2 g in dextrose 5 % 50 mL IVPB     2 g 100 mL/hr over 30 Minutes Intravenous  Once 03/29/17 0236 03/29/17 0339   03/29/17 0245  doxycycline (VIBRAMYCIN) 100 mg in dextrose 5 % 250 mL IVPB     100 mg 125 mL/hr over 120 Minutes Intravenous   Once 03/29/17 0236 03/29/17 0540   03/29/17 0242  doxycycline (VIBRAMYCIN) 100 MG injection    Comments:  [name]   : cabinet override      03/29/17 0242 03/29/17 1444   03/29/17 0230  cefoTEtan (CEFOTAN) 2 g in dextrose 5 % 50 mL IVPB  Status:  Discontinued     2 g 100 mL/hr over 30 Minutes Intravenous Every 12 hours 03/29/17 0229 03/29/17 0236      Medications: Scheduled Meds: . enoxaparin (LOVENOX) injection  30 mg Subcutaneous Q24H   Continuous Infusions: . dextrose 5 % and 0.45 % NaCl with KCl 20 mEq/L 100 mL/hr at 04/05/17 1520   PRN Meds:.acetaminophen **OR** acetaminophen, HYDROmorphone (DILAUDID) injection, ondansetron **OR** ondansetron (ZOFRAN) IV, oxyCODONE  Mickeal Skinner, MD Pg# [phone] [facility]

## 2017-04-06 NOTE — Progress Notes (Signed)
PROGRESS NOTE  Meagan [name] GGE:[id number] DOB: [birth year] DOA: 03/28/2017 PCP: Patient, No Pcp Per  Brief History:  31 year old female who initially presented to the [facility] on 03/28/2017 for management and evaluation of RLQ pain x 1 month.  The patient was initially treated with intravenous antibiotics at [facility] from 02/13/17-02/15/17 for suspected TOA.  7/27 pelvic ultrasound demonstrates a right ovary measuring 3 cm with an adjacent enlarged tubular mass. 7/28 CT ab/pelvis demonstrated a 10 cm necrotic mass in the right adnexal region suspicious for malignancy. Upon review of the patient's medical records, a CT abd/pelvis from 10/2016 demonstrated abnormalities related to her cecum along with adjacent adenopathy (see in care everywhere). GI and Gen surgery consulted.  Patient was transferred to [facility] on 03/29/17 under [facility] service for further work up. The patient underwent colonoscopy on 03/31/2017 which revealed an ulcerated cecal mass. Biopsies showed poorly differentiated adenocarcinoma. On 04/04/2017, the patient underwent ex laparotomy with open right colectomy and right salpingo-oophorectomy.General surgery team requested peri-op stent placement for ureteral protection / identification.  Urology was also consulted and assisted in placing bilateral ureteral stents secondary to mass effect of the tumor.  Assessment/Plan: Cecal adenocarcinoma -Presenting as right adnexal mass and pelvic ascites on 03/29/2017 CT abdomen/pelvis -Appreciate GI, general surgery, urology assistance -04/04/2017 Ex laparotomy with R-colectomy, right salpingo-oophorectomy; bilateral ureteral stent placement -advance to regular diet per general surgery -CEA 13.0 -CA-125--39.4 -pain management per general surgery  Anemia of chronic disease -baaseline Hgb ~8-9 -transfuse for Hgb <7 -Iron saturation 3%, ferritin 29 -Serum Y65--035 -Folic acid 46.5 -IV infed given 7/30 and 7/31 -start po iron    Tobacco abuse -cessation discussed  Anxiety -continue prn lorazepam   Disposition Plan:   Home 8/6 if cleared by general surgery Family Communication:   Spouse  updated at bedside 8/5  Consultants:  General surgery, urology, [place] [facility]  Code Status:  FULL  DVT Prophylaxis: Dana Lovenox   Procedures: As Listed in Progress Note Above  Antibiotics: None    Subjective: Patient complains of generalized abdominal pain. Denies any fevers, chills, chest pain, shortness breath, nausea, vomiting, diarrhea. She is passing flatus but not had a bowel movement yet. No dysuria or hematuria.  Objective: Vitals:   04/05/17 2339 04/06/17 0343 04/06/17 0800 04/06/17 1417  BP:    103/68  Pulse:    89  Resp: 15 20 19 18   Temp:    98.3 F (36.8 C)  TempSrc:    Oral  SpO2: 100% 99% 100% 99%  Weight:      Height:        Intake/Output Summary (Last 24 hours) at 04/06/17 1625 Last data filed at 04/06/17 0901  Gross per 24 hour  Intake              720 ml  Output             2125 ml  Net            -1405 ml   Weight change:  Exam:   General:  Pt is alert, follows commands appropriately, not in acute distress  HEENT: No icterus, No thrush, No neck mass, [place]/AT  Cardiovascular: RRR, S1/S2, no rubs, no gallops  Respiratory: Diminished breath sounds but clear to auscultation. No wheezing.  Abdomen: Soft/+BS, non tender, non distended, no guarding  Extremities: No edema, No lymphangitis, No petechiae, No rashes, no synovitis   Data Reviewed: I have personally reviewed following labs  and imaging studies Basic Metabolic Panel:  Recent Labs Lab 03/31/17 0414 04/02/17 0357 04/03/17 0358 04/04/17 1504 04/05/17 0406 04/06/17 0341  NA 137 140 139  --  136 138  K 3.3* 3.2* 3.6  --  4.3 5.0  CL 106 108 107  --  101 103  CO2 22 25 27   --  28 29  GLUCOSE 91 88 84  --  133* 139*  BUN <5* <5* <5*  --  <5* <5*  CREATININE 0.56 0.46 0.49 0.71 0.56 0.49  CALCIUM 8.4*  8.5* 8.7*  --  8.5* 8.4*  MG  --   --  1.8  --   --   --    Liver Function Tests:  Recent Labs Lab 04/02/17 0357 04/03/17 0358  AST 12* 13*  ALT 6* 6*  ALKPHOS 53 54  BILITOT 0.5 0.6  PROT 6.0* 6.4*  ALBUMIN 2.3* 2.5*   No results for input(s): LIPASE, AMYLASE in the last 168 hours. No results for input(s): AMMONIA in the last 168 hours. Coagulation Profile:  Recent Labs Lab 04/03/17 0358  INR 1.32   CBC:  Recent Labs Lab 04/02/17 0357 04/03/17 0358 04/04/17 1504 04/05/17 0406 04/06/17 0341  WBC 8.3 7.6 8.5 11.2* 5.3  NEUTROABS  --  4.2  --   --   --   HGB 8.0* 8.1* 8.9* 8.1* 7.8*  HCT 25.0* 25.4* 28.3* 25.4* 25.3*  MCV 80.6 81.2 80.4 81.9 83.8  PLT 371 371 428* 403* 343   Cardiac Enzymes: No results for input(s): CKTOTAL, CKMB, CKMBINDEX, TROPONINI in the last 168 hours. BNP: Invalid input(s): POCBNP CBG: No results for input(s): GLUCAP in the last 168 hours. HbA1C: No results for input(s): HGBA1C in the last 72 hours. Urine analysis:    Component Value Date/Time   COLORURINE YELLOW 03/28/2017 2310   APPEARANCEUR CLOUDY (A) 03/28/2017 2310   LABSPEC 1.008 03/28/2017 2310   PHURINE 7.5 03/28/2017 2310   GLUCOSEU NEGATIVE 03/28/2017 2310   HGBUR NEGATIVE 03/28/2017 2310   BILIRUBINUR NEGATIVE 03/28/2017 2310   KETONESUR NEGATIVE 03/28/2017 2310   PROTEINUR NEGATIVE 03/28/2017 2310   NITRITE NEGATIVE 03/28/2017 2310   LEUKOCYTESUR SMALL (A) 03/28/2017 2310   Sepsis Labs: @LABRCNTIP (procalcitonin:4,lacticidven:4) ) Recent Results (from the past 240 hour(s))  Surgical PCR screen     Status: None   Collection Time: 04/04/17  7:22 AM  Result Value Ref Range Status   MRSA, PCR NEGATIVE NEGATIVE Final   Staphylococcus aureus NEGATIVE NEGATIVE Final    Comment:        The Xpert SA Assay (FDA approved for NASAL specimens in patients over 32 years of age), is one component of a comprehensive surveillance program.  Test performance has been  validated by [facility] for patients greater than or equal to 52 year old. It is not intended to diagnose infection nor to guide or monitor treatment.      Scheduled Meds: . enoxaparin (LOVENOX) injection  30 mg Subcutaneous Q24H   Continuous Infusions:  Procedures/Studies: Dg Chest 2 View  Result Date: 04/02/2017 CLINICAL DATA:  Preop colon cancer.  No chest complaints. EXAM: CHEST  2 VIEW COMPARISON:  None. FINDINGS: The heart size and mediastinal contours are within normal limits. Both lungs are clear. The visualized skeletal structures are unremarkable. Mild rotoscoliosis convex RIGHT midthoracic region. IMPRESSION: No active cardiopulmonary disease. Electronically Signed   By: Staci Righter M.D.   On: 04/02/2017 11:40   US Transvaginal Non-ob  Result Date: 03/29/2017 CLINICAL DATA:  31 y/o F; right lower quadrant pain with a palpable lump for 4 days. History of right fallopian tube infection in June. EXAM: TRANSABDOMINAL AND TRANSVAGINAL ULTRASOUND OF PELVIS TECHNIQUE: Both transabdominal and transvaginal ultrasound examinations of the pelvis were performed. Transabdominal technique was performed for global imaging of the pelvis including uterus, ovaries, adnexal regions, and pelvic cul-de-sac. It was necessary to proceed with endovaginal exam following the transabdominal exam to visualize the uterus and bilateral adnexa. COMPARISON:  None FINDINGS: Uterus Measurements: 5.8 x 2.8 x 4.6 cm. No fibroids or other mass visualized. Retroverted. Endometrium Thickness: 4 mm.  No focal abnormality visualized. Right ovary Measurements: 2.9 x 2.2 x 1.7 cm. Limited visibility of the right ovary, no gross abnormality. Tubular structure within the right adnexa isoechoic to the uterus with multiple small echogenic foci in the wall "Beads on a string" sign. The tubular structure does not demonstrate peristalsis during the ultrasound examination and there is no bowel wall echo signature. Left ovary  Measurements: 2.2 x 1.5 x 1.7 cm. 1.2 cm simple ovarian follicle. Other findings Small volume of fluid in the pelvis. IMPRESSION: Right adnexal aperistaltic tubular structure containing complex fluid. Findings probably represent pyosalpinx given history of prior fallopian tube infection. Echogenic foci within the wall is an indicator of chronic inflammation. Electronically Signed   By: Kristine Garbe M.D.   On: 03/29/2017 00:29   US Pelvis Complete  Result Date: 03/29/2017 CLINICAL DATA:  31 y/o F; right lower quadrant pain with a palpable lump for 4 days. History of right fallopian tube infection in June. EXAM: TRANSABDOMINAL AND TRANSVAGINAL ULTRASOUND OF PELVIS TECHNIQUE: Both transabdominal and transvaginal ultrasound examinations of the pelvis were performed. Transabdominal technique was performed for global imaging of the pelvis including uterus, ovaries, adnexal regions, and pelvic cul-de-sac. It was necessary to proceed with endovaginal exam following the transabdominal exam to visualize the uterus and bilateral adnexa. COMPARISON:  None FINDINGS: Uterus Measurements: 5.8 x 2.8 x 4.6 cm. No fibroids or other mass visualized. Retroverted. Endometrium Thickness: 4 mm.  No focal abnormality visualized. Right ovary Measurements: 2.9 x 2.2 x 1.7 cm. Limited visibility of the right ovary, no gross abnormality. Tubular structure within the right adnexa isoechoic to the uterus with multiple small echogenic foci in the wall "Beads on a string" sign. The tubular structure does not demonstrate peristalsis during the ultrasound examination and there is no bowel wall echo signature. Left ovary Measurements: 2.2 x 1.5 x 1.7 cm. 1.2 cm simple ovarian follicle. Other findings Small volume of fluid in the pelvis. IMPRESSION: Right adnexal aperistaltic tubular structure containing complex fluid. Findings probably represent pyosalpinx given history of prior fallopian tube infection. Echogenic foci within the wall  is an indicator of chronic inflammation. Electronically Signed   By: Kristine Garbe M.D.   On: 03/29/2017 00:29   Ct Abdomen Pelvis W Contrast  Result Date: 03/29/2017 CLINICAL DATA:  Abdominal pain for 6 weeks. History of tubo-ovarian abscess. EXAM: CT ABDOMEN AND PELVIS WITH CONTRAST TECHNIQUE: Multidetector CT imaging of the abdomen and pelvis was performed using the standard protocol following bolus administration of intravenous contrast. CONTRAST:  41mL ISOVUE-300 IOPAMIDOL (ISOVUE-300) INJECTION 61% COMPARISON:  None. FINDINGS: Lower chest: No acute abnormality. Hepatobiliary: No focal liver abnormality is seen. No gallstones, gallbladder wall thickening, or biliary dilatation. Pancreas: Unremarkable. No pancreatic ductal dilatation or surrounding inflammatory changes. Spleen: Normal in size without focal abnormality. Adrenals/Urinary Tract: The adrenal glands are normal. Kidneys are unremarkable. Unremarkable appearance of the urinary bladder. Stomach/Bowel: The  stomach is normal. No pathologic dilatation of the large or small bowel loops. Vascular/Lymphatic: Normal appearance of the abdominal aorta. At the level of the aortic bifurcation there is a right common iliac node measuring 1.4 cm, image 38 of series 2. Adjacent enlarged lymph node measures 1.1 cm, image 40 of series 2. Reproductive: The uterus is grossly unremarkable. Other: Within the right iliac fossa there is a large necrotic mass with thick enhancing rim. The mass measures 10.3 cm, image number 32 of series 602. It is unclear whether not this mass is arising from the right adnexa or from the cecum. There is S suture material associated with this mass which is of on certain etiology. Multiple smaller surrounding masses are identified. For example within the right lower quadrant mesenteric there is a 2.6 cm solid mass, image 48 of series 2. Areas of loculated ascites are identified within the dependent portion of the pelvis.  Musculoskeletal: No acute or significant osseous findings. IMPRESSION: 1. There is a large thick walled necrotic mass within the right lower quadrant of the abdomen centered around the right iliac fossa. This measures up to 10 cm in length. Highly suspicious for malignancy. Origin is indeterminate. This may be arising from the cecum or right adnexa. At this time there is no evidence for bowel obstruction or obstructive uropathy. 2. 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 3. Loculated ascites within the pelvis. Electronically Signed   By: Kerby Moors M.D.   On: 03/29/2017 10:18   Dg C-arm 1-60 Min-no Report  Result Date: 04/04/2017 Fluoroscopy was utilized by the requesting physician.  No radiographic interpretation.    Ingeborg Fite, DO  [facility] Pager [phone]  If 7PM-7AM, please contact night-coverage [email or web address] Password [email or web address] 04/06/2017, 4:25 PM   LOS: 8 days

## 2017-04-07 ENCOUNTER — Other Ambulatory Visit: Payer: Self-pay

## 2017-04-07 ENCOUNTER — Encounter: Payer: Self-pay | Admitting: General Practice

## 2017-04-07 MED ORDER — SODIUM CHLORIDE 0.9 % IV SOLN: INTRAVENOUS | Status: AC

## 2017-04-07 MED ORDER — OXYCODONE HCL 5 MG PO TABS
5.0000 mg | ORAL_TABLET | ORAL | Status: DC | PRN
  Administered 2017-04-08: 10 mg via ORAL
  Filled 2017-04-07: qty 2

## 2017-04-07 MED ORDER — ACETAMINOPHEN 500 MG PO TABS
1000.0000 mg | ORAL_TABLET | Freq: Three times a day (TID) | ORAL | Status: DC
  Administered 2017-04-07 (×2): 1000 mg via ORAL
  Filled 2017-04-07 (×3): qty 2

## 2017-04-07 MED ORDER — OXYCODONE HCL 5 MG PO TABS: 5.0000 mg | ORAL_TABLET | ORAL | Status: DC | PRN

## 2017-04-07 NOTE — Progress Notes (Signed)
  Oncology Nurse Navigator Documentation  Navigator Location: [facility] (04/07/17 1525)   )Navigator Encounter Type: Education;Other (inpatient visit) (04/07/17 1525)   Met with patient while [name] -Chaplain was present. I was able to answer general questions about what the [facility] Infusion room was like and what the general flow of a "chemo day" might look like. Patient was upbeat stating "I'm going to be cancer free in 18 months."  Patient encouraged and supported.                   Patient Visit Type: Inpatient (04/07/17 1525) Treatment Phase: Pre-Tx/Tx Discussion (04/07/17 1525) Barriers/Navigation Needs: Coordination of Care (04/07/17 1525) Education: Understanding Cancer/ Treatment Options;Newly Diagnosed Cancer Education (04/07/17 1525) Interventions: Education;Psycho-social support (04/07/17 1525)     Education Method: Verbal (04/07/17 1525)      Acuity: Level 2 (04/07/17 1525) Acuity Level 1: Initial guidance, education and coordination as needed (04/07/17 1525)       Time Spent with Patient: 15 (04/07/17 1525)

## 2017-04-07 NOTE — Progress Notes (Signed)
[place] Spiritual Care Note  Met Annalise for the first time while following up in inpt room per request from husband [name].  Meagan [name] was alone and resting quietly.  She welcomed visit and shared about her plan to "fight like hell for a year and a half, then be cancer-free."  Per pt, hearing encouragement from MD via husband is very motivating for her, especially as she considers her three children.  GI Navigator Dawn Placke/RN arrived during visit.  Together we addressed [name]'s questions about the tone and environment of [facility]/infusion.  Following for support, but please also page if immediate needs arise. Thank you.   [place], [place], [facility] M-F daytime pager [phone] [facility] 24/7 pager [phone] Voicemail [phone]

## 2017-04-07 NOTE — Progress Notes (Signed)
CSW consulted to assist with dc resources. Spouse is requesting assistance completing FMLA forms. CSW is unable to assist with this request. NSG has alerted pt's spouse.   Werner Lean LCSW [phone]

## 2017-04-07 NOTE — Progress Notes (Addendum)
Patient explains doctor told her that she could leave when she had a bowel movement. Patient had bowel movement. Nurse paged Dr. Hal Hope, on-call hospitalist. Patient also refusing continuous fluids to be started. Nurse will discuss further with Dr. Hal Hope.  Per Dr. Hal Hope, patient needs to stay tonight and talk to Dr. Carles Collet tomorrow morning about being discharged. Dr. Hal Hope aware of patient not wanting to start IV fluids and he agrees patient does not have to start fluids at this time.

## 2017-04-07 NOTE — Progress Notes (Signed)
PROGRESS NOTE  Meagan [name] JIR:[id number] DOB: 02/05/1986 DOA: 03/28/2017 PCP: Patient, No Pcp Per   Brief History: 31 year old female who initially presented to the [facility] on 03/28/2017 for management and evaluation of RLQ pain x 1 month. The patient was initially treated with intravenous antibiotics at [facility] from 02/13/17-02/15/17 for suspected TOA. 7/27 pelvic ultrasound demonstrates a right ovary measuring 3 cm with an adjacent enlarged tubular mass. 7/28 CT ab/pelvis demonstrated a 10 cm necrotic mass in the right adnexal region suspicious for malignancy. Upon review of the patient's medical records, a CT abd/pelvis from 10/2016 demonstrated abnormalities related to her cecum along with adjacent adenopathy (see in care everywhere). GI and Gen surgery consulted. Patient was transferred to [facility] on 03/29/17 under [facility] service for further work up. The patient underwent colonoscopy on 03/31/2017 which revealed an ulcerated cecal mass. Biopsies showed poorly differentiated adenocarcinoma. On 04/04/2017, the patient underwent ex laparotomy with open right colectomy and right salpingo-oophorectomy.General surgery team requestedperi-op stent placement for ureteral protection / identification.Urology was also consulted and assisted in placing bilateral ureteral stents secondary to mass effect of the tumor.  Assessment/Plan: Cecal adenocarcinoma -Presenting as right adnexal mass and pelvic ascites on 03/29/2017 CT abdomen/pelvis -Appreciate GI, general surgery, urology assistance -04/04/2017 Ex laparotomy with R-colectomy, right salpingo-oophorectomy; bilateral ureteral stent placement -advance to regular diet per general surgery -CEA 13.0 -CA-125--39.4 -pain management per general surgery  Anemia of chronic disease -baaseline Hgb ~8-9 -transfuse for Hgb <7 -Iron saturation 3%, ferritin 29 -Serum B51--025 -Folic acid 85.2 -IV infed given 7/30 and 7/31 -start po iron     Tobacco abuse -cessation discussed  Anxiety -continue prn lorazepam   Disposition Plan: Home 8/7 if cleared by general surgery Family Communication: Mother updatedat bedside 8/6  Consultants: General surgery, urology, [facility]  Code Status: FULL  DVT Prophylaxis: [place] Lovenox   Procedures: As Listed in Progress Note Above  Antibiotics: None   Subjective: Patient is passing flatus but has not had a bowel movement. She complains of abdominal pain which is not any worse. Denies any fevers, chills, chest pain, shortness breath, nausea, vomiting, diarrhea.  Objective: Vitals:   04/06/17 0800 04/06/17 1417 04/06/17 2101 04/07/17 0622  BP:  103/68 100/63 (!) 97/56  Pulse:  89 (!) 106 96  Resp: 19 18 16 16   Temp:  98.3 F (36.8 C) 98.6 F (37 C) 97.9 F (36.6 C)  TempSrc:  Oral Oral Oral  SpO2: 100% 99% 99% 96%  Weight:    44.9 kg (99 lb)  Height:       No intake or output data in the 24 hours ending 04/07/17 1241 Weight change:  Exam:   General:  Pt is alert, follows commands appropriately, not in acute distress  HEENT: No icterus, No thrush, No neck mass, [place]/AT  Cardiovascular: RRR, S1/S2, no rubs, no gallops  Respiratory: CTA bilaterally, no wheezing, no crackles, no rhonchi  Abdomen: Soft/+BS, non tender, non distended, no guarding  Extremities: No edema, No lymphangitis, No petechiae, No rashes, no synovitis   Data Reviewed: I have personally reviewed following labs and imaging studies Basic Metabolic Panel:  Recent Labs Lab 04/02/17 0357 04/03/17 0358 04/04/17 1504 04/05/17 0406 04/06/17 0341  NA 140 139  --  136 138  K 3.2* 3.6  --  4.3 5.0  CL 108 107  --  101 103  CO2 25 27  --  28 29  GLUCOSE 88 84  --  133* 139*  BUN <5* <5*  --  <5* <5*  CREATININE 0.46 0.49 0.71 0.56 0.49  CALCIUM 8.5* 8.7*  --  8.5* 8.4*  MG  --  1.8  --   --   --    Liver Function Tests:  Recent Labs Lab 04/02/17 0357 04/03/17 0358  AST  12* 13*  ALT 6* 6*  ALKPHOS 53 54  BILITOT 0.5 0.6  PROT 6.0* 6.4*  ALBUMIN 2.3* 2.5*   No results for input(s): LIPASE, AMYLASE in the last 168 hours. No results for input(s): AMMONIA in the last 168 hours. Coagulation Profile:  Recent Labs Lab 04/03/17 0358  INR 1.32   CBC:  Recent Labs Lab 04/02/17 0357 04/03/17 0358 04/04/17 1504 04/05/17 0406 04/06/17 0341  WBC 8.3 7.6 8.5 11.2* 5.3  NEUTROABS  --  4.2  --   --   --   HGB 8.0* 8.1* 8.9* 8.1* 7.8*  HCT 25.0* 25.4* 28.3* 25.4* 25.3*  MCV 80.6 81.2 80.4 81.9 83.8  PLT 371 371 428* 403* 343   Cardiac Enzymes: No results for input(s): CKTOTAL, CKMB, CKMBINDEX, TROPONINI in the last 168 hours. BNP: Invalid input(s): POCBNP CBG: No results for input(s): GLUCAP in the last 168 hours. HbA1C: No results for input(s): HGBA1C in the last 72 hours. Urine analysis:    Component Value Date/Time   COLORURINE YELLOW 03/28/2017 2310   APPEARANCEUR CLOUDY (A) 03/28/2017 2310   LABSPEC 1.008 03/28/2017 2310   PHURINE 7.5 03/28/2017 2310   GLUCOSEU NEGATIVE 03/28/2017 2310   HGBUR NEGATIVE 03/28/2017 2310   BILIRUBINUR NEGATIVE 03/28/2017 2310   KETONESUR NEGATIVE 03/28/2017 2310   PROTEINUR NEGATIVE 03/28/2017 2310   NITRITE NEGATIVE 03/28/2017 2310   LEUKOCYTESUR SMALL (A) 03/28/2017 2310   Sepsis Labs: @LABRCNTIP (procalcitonin:4,lacticidven:4) ) Recent Results (from the past 240 hour(s))  Surgical PCR screen     Status: None   Collection Time: 04/04/17  7:22 AM  Result Value Ref Range Status   MRSA, PCR NEGATIVE NEGATIVE Final   Staphylococcus aureus NEGATIVE NEGATIVE Final    Comment:        The Xpert SA Assay (FDA approved for NASAL specimens in patients over 17 years of age), is one component of a comprehensive surveillance program.  Test performance has been validated by [facility] for patients greater than or equal to 26 year old. It is not intended to diagnose infection nor to guide or monitor  treatment.      Scheduled Meds: . acetaminophen  1,000 mg Oral Q8H  . enoxaparin (LOVENOX) injection  30 mg Subcutaneous Q24H  . ferrous sulfate  325 mg Oral Q breakfast   Continuous Infusions:  Procedures/Studies: Dg Chest 2 View  Result Date: 04/02/2017 CLINICAL DATA:  Preop colon cancer.  No chest complaints. EXAM: CHEST  2 VIEW COMPARISON:  None. FINDINGS: The heart size and mediastinal contours are within normal limits. Both lungs are clear. The visualized skeletal structures are unremarkable. Mild rotoscoliosis convex RIGHT midthoracic region. IMPRESSION: No active cardiopulmonary disease. Electronically Signed   By: Staci Righter M.D.   On: 04/02/2017 11:40   US Transvaginal Non-ob  Result Date: 03/29/2017 CLINICAL DATA:  31 y/o F; right lower quadrant pain with a palpable lump for 4 days. History of right fallopian tube infection in June. EXAM: TRANSABDOMINAL AND TRANSVAGINAL ULTRASOUND OF PELVIS TECHNIQUE: Both transabdominal and transvaginal ultrasound examinations of the pelvis were performed. Transabdominal technique was performed for global imaging of the pelvis including uterus, ovaries, adnexal regions, and  pelvic cul-de-sac. It was necessary to proceed with endovaginal exam following the transabdominal exam to visualize the uterus and bilateral adnexa. COMPARISON:  None FINDINGS: Uterus Measurements: 5.8 x 2.8 x 4.6 cm. No fibroids or other mass visualized. Retroverted. Endometrium Thickness: 4 mm.  No focal abnormality visualized. Right ovary Measurements: 2.9 x 2.2 x 1.7 cm. Limited visibility of the right ovary, no gross abnormality. Tubular structure within the right adnexa isoechoic to the uterus with multiple small echogenic foci in the wall "Beads on a string" sign. The tubular structure does not demonstrate peristalsis during the ultrasound examination and there is no bowel wall echo signature. Left ovary Measurements: 2.2 x 1.5 x 1.7 cm. 1.2 cm simple ovarian follicle. Other  findings Small volume of fluid in the pelvis. IMPRESSION: Right adnexal aperistaltic tubular structure containing complex fluid. Findings probably represent pyosalpinx given history of prior fallopian tube infection. Echogenic foci within the wall is an indicator of chronic inflammation. Electronically Signed   By: Kristine Garbe M.D.   On: 03/29/2017 00:29   US Pelvis Complete  Result Date: 03/29/2017 CLINICAL DATA:  31 y/o F; right lower quadrant pain with a palpable lump for 4 days. History of right fallopian tube infection in June. EXAM: TRANSABDOMINAL AND TRANSVAGINAL ULTRASOUND OF PELVIS TECHNIQUE: Both transabdominal and transvaginal ultrasound examinations of the pelvis were performed. Transabdominal technique was performed for global imaging of the pelvis including uterus, ovaries, adnexal regions, and pelvic cul-de-sac. It was necessary to proceed with endovaginal exam following the transabdominal exam to visualize the uterus and bilateral adnexa. COMPARISON:  None FINDINGS: Uterus Measurements: 5.8 x 2.8 x 4.6 cm. No fibroids or other mass visualized. Retroverted. Endometrium Thickness: 4 mm.  No focal abnormality visualized. Right ovary Measurements: 2.9 x 2.2 x 1.7 cm. Limited visibility of the right ovary, no gross abnormality. Tubular structure within the right adnexa isoechoic to the uterus with multiple small echogenic foci in the wall "Beads on a string" sign. The tubular structure does not demonstrate peristalsis during the ultrasound examination and there is no bowel wall echo signature. Left ovary Measurements: 2.2 x 1.5 x 1.7 cm. 1.2 cm simple ovarian follicle. Other findings Small volume of fluid in the pelvis. IMPRESSION: Right adnexal aperistaltic tubular structure containing complex fluid. Findings probably represent pyosalpinx given history of prior fallopian tube infection. Echogenic foci within the wall is an indicator of chronic inflammation. Electronically Signed   By:  Kristine Garbe M.D.   On: 03/29/2017 00:29   Ct Abdomen Pelvis W Contrast  Result Date: 03/29/2017 CLINICAL DATA:  Abdominal pain for 6 weeks. History of tubo-ovarian abscess. EXAM: CT ABDOMEN AND PELVIS WITH CONTRAST TECHNIQUE: Multidetector CT imaging of the abdomen and pelvis was performed using the standard protocol following bolus administration of intravenous contrast. CONTRAST:  48mL ISOVUE-300 IOPAMIDOL (ISOVUE-300) INJECTION 61% COMPARISON:  None. FINDINGS: Lower chest: No acute abnormality. Hepatobiliary: No focal liver abnormality is seen. No gallstones, gallbladder wall thickening, or biliary dilatation. Pancreas: Unremarkable. No pancreatic ductal dilatation or surrounding inflammatory changes. Spleen: Normal in size without focal abnormality. Adrenals/Urinary Tract: The adrenal glands are normal. Kidneys are unremarkable. Unremarkable appearance of the urinary bladder. Stomach/Bowel: The stomach is normal. No pathologic dilatation of the large or small bowel loops. Vascular/Lymphatic: Normal appearance of the abdominal aorta. At the level of the aortic bifurcation there is a right common iliac node measuring 1.4 cm, image 38 of series 2. Adjacent enlarged lymph node measures 1.1 cm, image 40 of series 2. Reproductive: The uterus  is grossly unremarkable. Other: Within the right iliac fossa there is a large necrotic mass with thick enhancing rim. The mass measures 10.3 cm, image number 32 of series 602. It is unclear whether not this mass is arising from the right adnexa or from the cecum. There is S suture material associated with this mass which is of on certain etiology. Multiple smaller surrounding masses are identified. For example within the right lower quadrant mesenteric there is a 2.6 cm solid mass, image 48 of series 2. Areas of loculated ascites are identified within the dependent portion of the pelvis. Musculoskeletal: No acute or significant osseous findings. IMPRESSION: 1.  There is a large thick walled necrotic mass within the right lower quadrant of the abdomen centered around the right iliac fossa. This measures up to 10 cm in length. Highly suspicious for malignancy. Origin is indeterminate. This may be arising from the cecum or right adnexa. At this time there is no evidence for bowel obstruction or obstructive uropathy. 2. 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 3. Loculated ascites within the pelvis. Electronically Signed   By: Kerby Moors M.D.   On: 03/29/2017 10:18   Dg C-arm 1-60 Min-no Report  Result Date: 04/04/2017 Fluoroscopy was utilized by the requesting physician.  No radiographic interpretation.    Roark Rufo, DO  [facility] Pager [phone]  If 7PM-7AM, please contact night-coverage [email or web address] Password [email or web address] 04/07/2017, 12:41 PM   LOS: 9 days

## 2017-04-07 NOTE — Progress Notes (Signed)
3 Days Post-Op    CC:RUQ tenderness and mass  Subjective: Still having pain and anixous.  No BM so far.  Having flatus.  Wound looks fine, she want a dressing over it.  Taking dilaudid for pain still and asking for sleeping med.  She got Ativan last PM.  Want to take some of that home with her.    Objective: Vital signs in last 24 hours: Temp:  [97.9 F (36.6 C)-98.6 F (37 C)] 97.9 F (36.6 C) (08/06 0622) Pulse Rate:  [89-106] 96 (08/06 0622) Resp:  [16-18] 16 (08/06 0622) BP: (97-103)/(56-68) 97/56 (08/06 0622) SpO2:  [96 %-99 %] 96 % (08/06 0622) Weight:  [44.9 kg (99 lb)] 44.9 kg (99 lb) (08/06 0622) Last BM Date: 04/01/17 480 PO Urine x 4 No BM  Pathology from Friday is still pending.    Intake/Output from previous day: 08/05 0701 - 08/06 0700 In: 480 [P.O.:480] Out: -  Intake/Output this shift: No intake/output data recorded.  General appearance: alert, cooperative and no distress Resp: clear to auscultation bilaterally GI: soft, + flatus, no BM, taking soft diet, but not eating much.  Incision looks fine.  she wants a dressing in place.  Staples in Midline incision.   Lab Results:   Recent Labs  04/05/17 0406 04/06/17 0341  WBC 11.2* 5.3  HGB 8.1* 7.8*  HCT 25.4* 25.3*  PLT 403* 343    BMET  Recent Labs  04/05/17 0406 04/06/17 0341  NA 136 138  K 4.3 5.0  CL 101 103  CO2 28 29  GLUCOSE 133* 139*  BUN <5* <5*  CREATININE 0.56 0.49  CALCIUM 8.5* 8.4*   PT/INR No results for input(s): LABPROT, INR in the last 72 hours.   Recent Labs Lab 04/02/17 0357 04/03/17 0358  AST 12* 13*  ALT 6* 6*  ALKPHOS 53 54  BILITOT 0.5 0.6  PROT 6.0* 6.4*  ALBUMIN 2.3* 2.5*     Lipase  No results found for: LIPASE   Medications: . enoxaparin (LOVENOX) injection  30 mg Subcutaneous Q24H  . ferrous sulfate  325 mg Oral Q breakfast    Assessment/Plan Locally advanced adenocarcinoma of the cecum. S/pOpen right colectomy,  Right  salpingo-oophorectomy, 04/04/17, DR. Armandina Gemma Colonoscopy 03/31/17, Dr. Harl Bowie: Infiltrative mass with ulceration in the cecum, could not identify the ileocecal valve or appendiceal oraface. Pathology positive for poorly differentiated adenocarcinoma. Loculated pelvic ascites Regional adenopathy Anemia Severe  protein calorie malnutrition - prealbumin 7.3 FEN:IV fluids/regular diet ID: None currently; pre-op DVT: Lovenox  Plan:  Continue to mobilize, soft diet, clean wound with soap and water, redress per her request.  Home when she is off the IV dilaudid and having BM's.  ? Ativan/Klonopin at home for anxiety      LOS: 9 days    Meagan [name] 04/07/2017 [phone]

## 2017-04-08 LAB — BASIC METABOLIC PANEL
ANION GAP: 8 (ref 5–15)
BUN: <5 mg/dL — ABNORMAL LOW (ref 6–20)
CALCIUM: 8.4 mg/dL — AB (ref 8.9–10.3)
CO2: 27 mmol/L (ref 22–32)
CREATININE: 0.48 mg/dL (ref 0.44–1.00)
Chloride: 108 mmol/L (ref 101–111)
Glucose, Bld: 87 mg/dL (ref 65–99)
Potassium: 3.3 mmol/L — ABNORMAL LOW (ref 3.5–5.1)
SODIUM: 143 mmol/L (ref 135–145)

## 2017-04-08 MED ORDER — DOCUSATE SODIUM 100 MG PO CAPS: 100.0000 mg | ORAL_CAPSULE | Freq: Two times a day (BID) | ORAL | 0 refills | Status: DC

## 2017-04-08 MED ORDER — TAB-A-VITE/IRON PO TABS: ORAL_TABLET | ORAL | 0 refills | Status: DC

## 2017-04-08 MED ORDER — TAB-A-VITE/IRON PO TABS
1.0000 | ORAL_TABLET | Freq: Every day | ORAL | Status: DC
  Filled 2017-04-08: qty 1

## 2017-04-08 MED ORDER — IBUPROFEN 200 MG PO TABS: ORAL_TABLET | ORAL | Status: DC

## 2017-04-08 MED ORDER — FERROUS SULFATE 325 (65 FE) MG PO TABS: 325.0000 mg | ORAL_TABLET | Freq: Every day | ORAL | 0 refills | Status: DC

## 2017-04-08 MED ORDER — SENNA 8.6 MG PO TABS: 1.0000 | ORAL_TABLET | Freq: Every day | ORAL | 0 refills | Status: DC

## 2017-04-08 MED ORDER — OXYCODONE HCL 5 MG PO TABS: 5.0000 mg | ORAL_TABLET | ORAL | 0 refills | Status: DC | PRN

## 2017-04-08 MED ORDER — ACETAMINOPHEN 500 MG PO TABS: ORAL_TABLET | ORAL | 0 refills | Status: DC

## 2017-04-08 MED ORDER — LORAZEPAM 0.5 MG PO TABS: 0.5000 mg | ORAL_TABLET | Freq: Four times a day (QID) | ORAL | 0 refills | Status: DC | PRN

## 2017-04-08 MED ORDER — POTASSIUM CHLORIDE CRYS ER 20 MEQ PO TBCR: 20.0000 meq | EXTENDED_RELEASE_TABLET | Freq: Once | ORAL | Status: DC

## 2017-04-08 NOTE — Progress Notes (Signed)
4 Days Post-Op    CC:  RUQ tenderness and mass   Subjective: Had a BM, I told her I was concerned with her PO intake, and she says she is doing fine.  Really wants to go home. Doesn't want to talk about anything else.  Her incision looks fine.  She doesn't need the dressing but wants it there.   She really wants something for anxiety and sleep at home.   Objective: Vital signs in last 24 hours: Temp:  [97.9 F (36.6 C)-98.4 F (36.9 C)] 97.9 F (36.6 C) (08/07 0537) Pulse Rate:  [89-110] 89 (08/07 0537) Resp:  [16] 16 (08/07 0537) BP: (102-111)/(69-80) 111/74 (08/07 0537) SpO2:  [99 %-100 %] 100 % (08/07 0537) Weight:  [44.9 kg (98 lb 15.8 oz)] 44.9 kg (98 lb 15.8 oz) (08/06 2109) Last BM Date: 04/07/17 Intake not recorded Voided x 3 BM x 1 Afebrile, VSS K+ down to 3.3 No CBC No xrays   Intake/Output from previous day: No intake/output data recorded. Intake/Output this shift: No intake/output data recorded.  General appearance: alert and no distress Resp: clear to auscultation bilaterally GI: soft, sore, site looks fine, + BS, +BM.    Lab Results:   Recent Labs  04/06/17 0341  WBC 5.3  HGB 7.8*  HCT 25.3*  PLT 343    BMET  Recent Labs  04/06/17 0341 04/08/17 0400  NA 138 143  K 5.0 3.3*  CL 103 108  CO2 29 27  GLUCOSE 139* 87  BUN <5* <5*  CREATININE 0.49 0.48  CALCIUM 8.4* 8.4*   PT/INR No results for input(s): LABPROT, INR in the last 72 hours.   Recent Labs Lab 04/02/17 0357 04/03/17 0358  AST 12* 13*  ALT 6* 6*  ALKPHOS 53 54  BILITOT 0.5 0.6  PROT 6.0* 6.4*  ALBUMIN 2.3* 2.5*     Lipase  No results found for: LIPASE   Medications: . acetaminophen  1,000 mg Oral Q8H  . enoxaparin (LOVENOX) injection  30 mg Subcutaneous Q24H  . ferrous sulfate  325 mg Oral Q breakfast  Colon, segmental resection for tumor, right - INVASIVE ADENOCARCINOMA, POORLY DIFFERENTIATED, SPANNING 9 CM. - TUMOR INVADES THROUGH SEROSA AND IS ADHERENT  TO OVARY, FALLOPIAN TUBE, AND OMENTUM. - RESECTION MARGINS ARE NEGATIVE. 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 Pathologic Staging: pT4b, pN2b, pMX  Assessment/Plan Locally advanced adenocarcinoma of the cecum. S/pOpen right colectomy,  Right salpingo-oophorectomy, 04/04/17, DR. Armandina Gemma  POD4 Colonoscopy 03/31/17, Dr. Harl Bowie: Infiltrative mass with ulceration in the cecum, could not identify the ileocecal valve or appendiceal oraface. Pathology positive for poorly differentiated adenocarcinoma. Loculated pelvic ascites Regional adenopathy Anemia Severe protein calorie malnutrition - prealbumin 7.3 FEN:IV fluids/regular diet ID: None currently; pre-op DVT: Lovenox    Plan:  Home today with staple removal in our office next week follow up with Dr. Harlow Asa in 2 weeks.  Follow up with oncology as directed. She controlled pain yesterday with Tylenol and ibuprofen.  I have follow up appointments for her to get her staples out and see Dr. Harlow Asa.  Post op discharge instructions for surgery and diet are in AVS also.  I switched her to and MVI with FE, instead of just FE.  I will defer anxiety and sleep medicines to Dr. Carles Collet.  I have personally reviewed the patients medication history on the [place] controlled substance database.  LOS: 10 days    Jessey Stehlin 04/08/2017 [phone]

## 2017-04-08 NOTE — Progress Notes (Signed)
Discharge instructions reviewed with pt and husband.  Patient discharged home in the care of her husband.

## 2017-04-08 NOTE — Discharge Summary (Signed)
Physician Discharge Summary  Meagan [name] NWG:[id number] DOB: March 02, 1986 DOA: 03/28/2017  PCP: Patient, No Pcp Per  Admit date: 03/28/2017 Discharge date: 04/08/2017  Admitted From: Home Disposition:  Home  Recommendations for Outpatient Follow-up:  1. Follow up with PCP in 1-2 weeks 2. Please obtain BMP/CBC in one week   Discharge Condition: Stable CODE STATUS: FULL Diet recommendation: Regular   Brief/Interim Summary: 31 year old female who initially presented to the [facility] on 03/28/2017 for management and evaluation of RLQ pain x 1 month. The patient was initially treated with intravenous antibiotics at [facility] from 02/13/17-02/15/17 for suspected TOA. 7/27 pelvic ultrasound demonstrates a right ovary measuring 3 cm with an adjacent enlarged tubular mass. 7/28 CT ab/pelvis demonstrated a 10 cm necrotic mass in the right adnexal region suspicious for malignancy. Upon review of the patient's medical records, a CT abd/pelvis from 10/2016 demonstrated abnormalities related to her cecum along with adjacent adenopathy (see in care everywhere). GI and Gen surgery consulted. Patient was transferred to [facility] on 03/29/17 under [facility] service for further work up. The patient underwentcolonoscopy on 03/31/2017 which revealed an ulcerated cecal mass. Biopsies showed poorly differentiated adenocarcinoma. On 04/04/2017, the patient underwent ex laparotomy with open right colectomy and right salpingo-oophorectomy.General surgery team requestedperi-op stent placement for ureteral protection / identification.Urology was also consulted and assisted in placing bilateral ureteral stents secondary to mass effect of the tumor.  Discharge Diagnoses:  Cecal adenocarcinoma -Presenting as right adnexal mass and pelvic ascites on 03/29/2017 CT abdomen/pelvis -Appreciate GI, general surgery, urology assistance -04/04/2017 Ex laparotomy with R-colectomy, right salpingo-oophorectomy;bilateral ureteral stent  placement -advance to regular dietper general surgery-->tolerating -passing flatus and having BM prior to d/c -CEA 13.0 -CA-125--39.4 -pain management per general surgery -case was discussed with Dr. Leveda Anna will arrange for the pt to have follow up at the [facility] after d/c  Anemia of chronic disease -baaseline Hgb ~8-9 -transfuse for Hgb <7 -Iron saturation 3%, ferritin 29 -Serum V78--469 -Folic acid 62.9 -IV infed given 7/30 and 7/31 -start po iron daily -Hgb 7.8 at time of discharge  Tobacco abuse -cessation discussed  Anxiety -continue prn lorazepam -Rx for lorazepam 0.5 mg, #21, 1 po q 6 hrs prn anxiety -The [place] Controlled Substance Reporting System has been queried for this patient for the past 12 months prior to prescribing any opioids.     Discharge Instructions  Discharge Instructions    Diet general    Complete by:  As directed    Increase activity slowly    Complete by:  As directed      Allergies as of 04/08/2017   No Known Allergies     Medication List    TAKE these medications   acetaminophen 500 MG tablet Commonly known as:  TYLENOL Tylenol 500 mg - you can take two tablets every 8 hours for pain. As long as your having pain I would use this first for your pain.  Then the ibuprofen as your second line of treatment for pain.  Your last choice is the oxycodone.    You can buy the Tylenol(acetaminophen) over the counter at any drug store.  DO NOT TAKE MORE THAN 4000 MG OF TYLENOL PER DAY.  IT CAN HARM YOUR LIVER.   docusate sodium 100 MG capsule Commonly known as:  COLACE Take 1 capsule (100 mg total) by mouth 2 (two) times daily.   ferrous sulfate 325 (65 FE) MG tablet Take 1 tablet (325 mg total) by mouth daily with breakfast.  ibuprofen 200 MG tablet Commonly known as:  ADVIL,MOTRIN You can take 2-3 tablets every 8 hours for pain as needed.  This is your second line pain medication.   Do not take more than  recommended, it can harm your kidneys and cause stomach ulcers if taken incorrectly.  You can buy this at any drug store over the counter. What changed:  medication strength  how much to take  how to take this  additional instructions   LORazepam 0.5 MG tablet Commonly known as:  ATIVAN Take 1 tablet (0.5 mg total) by mouth every 6 (six) hours as needed for anxiety.   multivitamins with iron Tabs tablet You can but over the counter at any drug store, I would look for the Women's One a day vitamin or the generic equivalent.   Follow package directions.   oxyCODONE 5 MG immediate release tablet Commonly known as:  Oxy IR/ROXICODONE Take 1-2 tablets (5-10 mg total) by mouth every 4 (four) hours as needed for moderate pain (if response to ibuprofen alone not sufficient).   senna 8.6 MG Tabs tablet Commonly known as:  SENOKOT Take 1 tablet (8.6 mg total) by mouth daily.      Follow-up Information    [facility] [place] Follow up on 04/18/2017.   Specialty:  General Surgery Why:  Your appointment for staple removal is at 4 PM, be at the 30 minutes early for check in. You will just see the nurse for this visit.   Contact information: [address] [place] [address] [phone]        Armandina Gemma, MD Follow up on 04/24/2017.   Specialty:  General Surgery Why:  Your appointment is at 3:15 PM be at the office 30 minutes early for check in.   Contact information: [address] [place] [address] [phone]        Ladell Pier, MD Follow up.   Specialty:  Oncology Why:  [facility] for follow up appointment Contact information: [place] [address] [phone]          No Known Allergies  Consultations:  Med Onc--[place]  General surgery   Procedures/Studies: Dg Chest 2 View  Result Date: 04/02/2017 CLINICAL DATA:  Preop colon cancer.  No chest complaints. EXAM: CHEST  2 VIEW COMPARISON:  None.  FINDINGS: The heart size and mediastinal contours are within normal limits. Both lungs are clear. The visualized skeletal structures are unremarkable. Mild rotoscoliosis convex RIGHT midthoracic region. IMPRESSION: No active cardiopulmonary disease. Electronically Signed   By: Staci Righter M.D.   On: 04/02/2017 11:40   US Transvaginal Non-ob  Result Date: 03/29/2017 CLINICAL DATA:  31 y/o F; right lower quadrant pain with a palpable lump for 4 days. History of right fallopian tube infection in June. EXAM: TRANSABDOMINAL AND TRANSVAGINAL ULTRASOUND OF PELVIS TECHNIQUE: Both transabdominal and transvaginal ultrasound examinations of the pelvis were performed. Transabdominal technique was performed for global imaging of the pelvis including uterus, ovaries, adnexal regions, and pelvic cul-de-sac. It was necessary to proceed with endovaginal exam following the transabdominal exam to visualize the uterus and bilateral adnexa. COMPARISON:  None FINDINGS: Uterus Measurements: 5.8 x 2.8 x 4.6 cm. No fibroids or other mass visualized. Retroverted. Endometrium Thickness: 4 mm.  No focal abnormality visualized. Right ovary Measurements: 2.9 x 2.2 x 1.7 cm. Limited visibility of the right ovary, no gross abnormality. Tubular structure within the right adnexa isoechoic to the uterus with multiple small  echogenic foci in the wall "Beads on a string" sign. The tubular structure does not demonstrate peristalsis during the ultrasound examination and there is no bowel wall echo signature. Left ovary Measurements: 2.2 x 1.5 x 1.7 cm. 1.2 cm simple ovarian follicle. Other findings Small volume of fluid in the pelvis. IMPRESSION: Right adnexal aperistaltic tubular structure containing complex fluid. Findings probably represent pyosalpinx given history of prior fallopian tube infection. Echogenic foci within the wall is an indicator of chronic inflammation. Electronically Signed   By: Kristine Garbe M.D.   On: 03/29/2017  00:29   US Pelvis Complete  Result Date: 03/29/2017 CLINICAL DATA:  31 y/o F; right lower quadrant pain with a palpable lump for 4 days. History of right fallopian tube infection in June. EXAM: TRANSABDOMINAL AND TRANSVAGINAL ULTRASOUND OF PELVIS TECHNIQUE: Both transabdominal and transvaginal ultrasound examinations of the pelvis were performed. Transabdominal technique was performed for global imaging of the pelvis including uterus, ovaries, adnexal regions, and pelvic cul-de-sac. It was necessary to proceed with endovaginal exam following the transabdominal exam to visualize the uterus and bilateral adnexa. COMPARISON:  None FINDINGS: Uterus Measurements: 5.8 x 2.8 x 4.6 cm. No fibroids or other mass visualized. Retroverted. Endometrium Thickness: 4 mm.  No focal abnormality visualized. Right ovary Measurements: 2.9 x 2.2 x 1.7 cm. Limited visibility of the right ovary, no gross abnormality. Tubular structure within the right adnexa isoechoic to the uterus with multiple small echogenic foci in the wall "Beads on a string" sign. The tubular structure does not demonstrate peristalsis during the ultrasound examination and there is no bowel wall echo signature. Left ovary Measurements: 2.2 x 1.5 x 1.7 cm. 1.2 cm simple ovarian follicle. Other findings Small volume of fluid in the pelvis. IMPRESSION: Right adnexal aperistaltic tubular structure containing complex fluid. Findings probably represent pyosalpinx given history of prior fallopian tube infection. Echogenic foci within the wall is an indicator of chronic inflammation. Electronically Signed   By: Kristine Garbe M.D.   On: 03/29/2017 00:29   Ct Abdomen Pelvis W Contrast  Result Date: 03/29/2017 CLINICAL DATA:  Abdominal pain for 6 weeks. History of tubo-ovarian abscess. EXAM: CT ABDOMEN AND PELVIS WITH CONTRAST TECHNIQUE: Multidetector CT imaging of the abdomen and pelvis was performed using the standard protocol following bolus administration  of intravenous contrast. CONTRAST:  61mL ISOVUE-300 IOPAMIDOL (ISOVUE-300) INJECTION 61% COMPARISON:  None. FINDINGS: Lower chest: No acute abnormality. Hepatobiliary: No focal liver abnormality is seen. No gallstones, gallbladder wall thickening, or biliary dilatation. Pancreas: Unremarkable. No pancreatic ductal dilatation or surrounding inflammatory changes. Spleen: Normal in size without focal abnormality. Adrenals/Urinary Tract: The adrenal glands are normal. Kidneys are unremarkable. Unremarkable appearance of the urinary bladder. Stomach/Bowel: The stomach is normal. No pathologic dilatation of the large or small bowel loops. Vascular/Lymphatic: Normal appearance of the abdominal aorta. At the level of the aortic bifurcation there is a right common iliac node measuring 1.4 cm, image 38 of series 2. Adjacent enlarged lymph node measures 1.1 cm, image 40 of series 2. Reproductive: The uterus is grossly unremarkable. Other: Within the right iliac fossa there is a large necrotic mass with thick enhancing rim. The mass measures 10.3 cm, image number 32 of series 602. It is unclear whether not this mass is arising from the right adnexa or from the cecum. There is S suture material associated with this mass which is of on certain etiology. Multiple smaller surrounding masses are identified. For example within the right lower quadrant mesenteric there is a 2.6  cm solid mass, image 48 of series 2. Areas of loculated ascites are identified within the dependent portion of the pelvis. Musculoskeletal: No acute or significant osseous findings. IMPRESSION: 1. There is a large thick walled necrotic mass within the right lower quadrant of the abdomen centered around the right iliac fossa. This measures up to 10 cm in length. Highly suspicious for malignancy. Origin is indeterminate. This may be arising from the cecum or right adnexa. At this time there is no evidence for bowel obstruction or obstructive uropathy. 2. Enlarged  right common iliac lymph nodes compatible with metastatic adenopathy. 3. Loculated ascites within the pelvis. Electronically Signed   By: Kerby Moors M.D.   On: 03/29/2017 10:18   Dg C-arm 1-60 Min-no Report  Result Date: 04/04/2017 Fluoroscopy was utilized by the requesting physician.  No radiographic interpretation.         Discharge Exam: Vitals:   04/07/17 2109 04/08/17 0537  BP: 102/69 111/74  Pulse: (!) 102 89  Resp: 16 16  Temp: 98.2 F (36.8 C) 97.9 F (36.6 C)   Vitals:   04/07/17 0622 04/07/17 1515 04/07/17 2109 04/08/17 0537  BP: (!) 97/56 110/80 102/69 111/74  Pulse: 96 (!) 110 (!) 102 89  Resp: 16 16 16 16   Temp: 97.9 F (36.6 C) 98.4 F (36.9 C) 98.2 F (36.8 C) 97.9 F (36.6 C)  TempSrc: Oral Oral Oral Oral  SpO2: 96% 99% 100% 100%  Weight: 44.9 kg (99 lb)  44.9 kg (98 lb 15.8 oz)   Height:        General: Pt is alert, awake, not in acute distress Cardiovascular: RRR, S1/S2 +, no rubs, no gallops Respiratory: CTA bilaterally, no wheezing, no rhonchi Abdominal: Soft, NT, ND, bowel sounds + Extremities: no edema, no cyanosis   The results of significant diagnostics from this hospitalization (including imaging, microbiology, ancillary and laboratory) are listed below for reference.    Significant Diagnostic Studies: Dg Chest 2 View  Result Date: 04/02/2017 CLINICAL DATA:  Preop colon cancer.  No chest complaints. EXAM: CHEST  2 VIEW COMPARISON:  None. FINDINGS: The heart size and mediastinal contours are within normal limits. Both lungs are clear. The visualized skeletal structures are unremarkable. Mild rotoscoliosis convex RIGHT midthoracic region. IMPRESSION: No active cardiopulmonary disease. Electronically Signed   By: Staci Righter M.D.   On: 04/02/2017 11:40   US Transvaginal Non-ob  Result Date: 03/29/2017 CLINICAL DATA:  31 y/o F; right lower quadrant pain with a palpable lump for 4 days. History of right fallopian tube infection in June.  EXAM: TRANSABDOMINAL AND TRANSVAGINAL ULTRASOUND OF PELVIS TECHNIQUE: Both transabdominal and transvaginal ultrasound examinations of the pelvis were performed. Transabdominal technique was performed for global imaging of the pelvis including uterus, ovaries, adnexal regions, and pelvic cul-de-sac. It was necessary to proceed with endovaginal exam following the transabdominal exam to visualize the uterus and bilateral adnexa. COMPARISON:  None FINDINGS: Uterus Measurements: 5.8 x 2.8 x 4.6 cm. No fibroids or other mass visualized. Retroverted. Endometrium Thickness: 4 mm.  No focal abnormality visualized. Right ovary Measurements: 2.9 x 2.2 x 1.7 cm. Limited visibility of the right ovary, no gross abnormality. Tubular structure within the right adnexa isoechoic to the uterus with multiple small echogenic foci in the wall "Beads on a string" sign. The tubular structure does not demonstrate peristalsis during the ultrasound examination and there is no bowel wall echo signature. Left ovary Measurements: 2.2 x 1.5 x 1.7 cm. 1.2 cm simple ovarian follicle. Other  findings Small volume of fluid in the pelvis. IMPRESSION: Right adnexal aperistaltic tubular structure containing complex fluid. Findings probably represent pyosalpinx given history of prior fallopian tube infection. Echogenic foci within the wall is an indicator of chronic inflammation. Electronically Signed   By: Kristine Garbe M.D.   On: 03/29/2017 00:29   US Pelvis Complete  Result Date: 03/29/2017 CLINICAL DATA:  31 y/o F; right lower quadrant pain with a palpable lump for 4 days. History of right fallopian tube infection in June. EXAM: TRANSABDOMINAL AND TRANSVAGINAL ULTRASOUND OF PELVIS TECHNIQUE: Both transabdominal and transvaginal ultrasound examinations of the pelvis were performed. Transabdominal technique was performed for global imaging of the pelvis including uterus, ovaries, adnexal regions, and pelvic cul-de-sac. It was necessary to  proceed with endovaginal exam following the transabdominal exam to visualize the uterus and bilateral adnexa. COMPARISON:  None FINDINGS: Uterus Measurements: 5.8 x 2.8 x 4.6 cm. No fibroids or other mass visualized. Retroverted. Endometrium Thickness: 4 mm.  No focal abnormality visualized. Right ovary Measurements: 2.9 x 2.2 x 1.7 cm. Limited visibility of the right ovary, no gross abnormality. Tubular structure within the right adnexa isoechoic to the uterus with multiple small echogenic foci in the wall "Beads on a string" sign. The tubular structure does not demonstrate peristalsis during the ultrasound examination and there is no bowel wall echo signature. Left ovary Measurements: 2.2 x 1.5 x 1.7 cm. 1.2 cm simple ovarian follicle. Other findings Small volume of fluid in the pelvis. IMPRESSION: Right adnexal aperistaltic tubular structure containing complex fluid. Findings probably represent pyosalpinx given history of prior fallopian tube infection. Echogenic foci within the wall is an indicator of chronic inflammation. Electronically Signed   By: Kristine Garbe M.D.   On: 03/29/2017 00:29   Ct Abdomen Pelvis W Contrast  Result Date: 03/29/2017 CLINICAL DATA:  Abdominal pain for 6 weeks. History of tubo-ovarian abscess. EXAM: CT ABDOMEN AND PELVIS WITH CONTRAST TECHNIQUE: Multidetector CT imaging of the abdomen and pelvis was performed using the standard protocol following bolus administration of intravenous contrast. CONTRAST:  107mL ISOVUE-300 IOPAMIDOL (ISOVUE-300) INJECTION 61% COMPARISON:  None. FINDINGS: Lower chest: No acute abnormality. Hepatobiliary: No focal liver abnormality is seen. No gallstones, gallbladder wall thickening, or biliary dilatation. Pancreas: Unremarkable. No pancreatic ductal dilatation or surrounding inflammatory changes. Spleen: Normal in size without focal abnormality. Adrenals/Urinary Tract: The adrenal glands are normal. Kidneys are unremarkable. Unremarkable  appearance of the urinary bladder. Stomach/Bowel: The stomach is normal. No pathologic dilatation of the large or small bowel loops. Vascular/Lymphatic: Normal appearance of the abdominal aorta. At the level of the aortic bifurcation there is a right common iliac node measuring 1.4 cm, image 38 of series 2. Adjacent enlarged lymph node measures 1.1 cm, image 40 of series 2. Reproductive: The uterus is grossly unremarkable. Other: Within the right iliac fossa there is a large necrotic mass with thick enhancing rim. The mass measures 10.3 cm, image number 32 of series 602. It is unclear whether not this mass is arising from the right adnexa or from the cecum. There is S suture material associated with this mass which is of on certain etiology. Multiple smaller surrounding masses are identified. For example within the right lower quadrant mesenteric there is a 2.6 cm solid mass, image 48 of series 2. Areas of loculated ascites are identified within the dependent portion of the pelvis. Musculoskeletal: No acute or significant osseous findings. IMPRESSION: 1. There is a large thick walled necrotic mass within the right lower quadrant of  the abdomen centered around the right iliac fossa. This measures up to 10 cm in length. Highly suspicious for malignancy. Origin is indeterminate. This may be arising from the cecum or right adnexa. At this time there is no evidence for bowel obstruction or obstructive uropathy. 2. 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 3. Loculated ascites within the pelvis. Electronically Signed   By: Kerby Moors M.D.   On: 03/29/2017 10:18   Dg C-arm 1-60 Min-no Report  Result Date: 04/04/2017 Fluoroscopy was utilized by the requesting physician.  No radiographic interpretation.     Microbiology: Recent Results (from the past 240 hour(s))  Surgical PCR screen     Status: None   Collection Time: 04/04/17  7:22 AM  Result Value Ref Range Status    MRSA, PCR NEGATIVE NEGATIVE Final   Staphylococcus aureus NEGATIVE NEGATIVE Final    Comment:        The Xpert SA Assay (FDA approved for NASAL specimens in patients over 50 years of age), is one component of a comprehensive surveillance program.  Test performance has been validated by [facility] for patients greater than or equal to 60 year old. It is not intended to diagnose infection nor to guide or monitor treatment.      Labs: Basic Metabolic Panel:  Recent Labs Lab 04/02/17 0357 04/03/17 0358 04/04/17 1504 04/05/17 0406 04/06/17 0341 04/08/17 0400  NA 140 139  --  136 138 143  K 3.2* 3.6  --  4.3 5.0 3.3*  CL 108 107  --  101 103 108  CO2 25 27  --  28 29 27   GLUCOSE 88 84  --  133* 139* 87  BUN <5* <5*  --  <5* <5* <5*  CREATININE 0.46 0.49 0.71 0.56 0.49 0.48  CALCIUM 8.5* 8.7*  --  8.5* 8.4* 8.4*  MG  --  1.8  --   --   --   --    Liver Function Tests:  Recent Labs Lab 04/02/17 0357 04/03/17 0358  AST 12* 13*  ALT 6* 6*  ALKPHOS 53 54  BILITOT 0.5 0.6  PROT 6.0* 6.4*  ALBUMIN 2.3* 2.5*   No results for input(s): LIPASE, AMYLASE in the last 168 hours. No results for input(s): AMMONIA in the last 168 hours. CBC:  Recent Labs Lab 04/02/17 0357 04/03/17 0358 04/04/17 1504 04/05/17 0406 04/06/17 0341  WBC 8.3 7.6 8.5 11.2* 5.3  NEUTROABS  --  4.2  --   --   --   HGB 8.0* 8.1* 8.9* 8.1* 7.8*  HCT 25.0* 25.4* 28.3* 25.4* 25.3*  MCV 80.6 81.2 80.4 81.9 83.8  PLT 371 371 428* 403* 343   Cardiac Enzymes: No results for input(s): CKTOTAL, CKMB, CKMBINDEX, TROPONINI in the last 168 hours. BNP: Invalid input(s): POCBNP CBG: No results for input(s): GLUCAP in the last 168 hours.  Time coordinating discharge:  Greater than 30 minutes  Signed:  Khalia Gong, DO [facility] Pager: [phone] 04/08/2017, 11:53 AM

## 2017-04-08 NOTE — Discharge Instructions (Signed)
[facility]      [facility] °[phone] ° °OPEN ABDOMINAL SURGERY: POST OP INSTRUCTIONS ° °Always review your discharge instruction sheet given to you by the facility where your surgery was performed. ° °IF YOU HAVE DISABILITY OR FAMILY LEAVE FORMS, YOU MUST BRING THEM TO THE OFFICE FOR PROCESSING.  PLEASE DO NOT GIVE THEM TO YOUR DOCTOR. ° °1. A prescription for pain medication may be given to you upon discharge.  Take your pain medication as prescribed, if needed.  If narcotic pain medicine is not needed, then you may take acetaminophen (Tylenol) or ibuprofen (Advil) as needed. °2. Take your usually prescribed medications unless otherwise directed. °3. If you need a refill on your pain medication, please contact your pharmacy. They will contact our office to request authorization.  Prescriptions will not be filled after 5pm or on week-ends. °4. You should follow a light diet the first few days after arrival home, such as soup and crackers, pudding, etc.unless your doctor has advised otherwise. A high-fiber, low fat diet can be resumed as tolerated.   Be sure to include lots of fluids daily. Most patients will experience some swelling and bruising on the chest and neck area.  Ice packs will help.  Swelling and bruising can take several days to resolve °5. Most patients will experience some swelling and bruising in the area of the incision. Ice pack will help. Swelling and bruising can take several days to resolve..  °6. It is common to experience some constipation if taking pain medication after surgery.  Increasing fluid intake and taking a stool softener will usually help or prevent this problem from occurring.  A mild laxative (Milk of Magnesia or Miralax) should be taken according to package directions if there are no bowel movements after 48 hours. °7.  You may have steri-strips (small skin tapes) in place directly over the incision.  These strips should be left on the skin for 7-10 days.  If your  surgeon used skin glue on the incision, you may shower in 24 hours.  The glue will flake off over the next 2-3 weeks.  Any sutures or staples will be removed at the office during your follow-up visit. You may find that a light gauze bandage over your incision may keep your staples from being rubbed or pulled. You may shower and replace the bandage daily. °8. ACTIVITIES:  You may resume regular (light) daily activities beginning the next day--such as daily self-care, walking, climbing stairs--gradually increasing activities as tolerated.  You may have sexual intercourse when it is comfortable.  Refrain from any heavy lifting or straining until approved by your doctor. °a. You may drive when you no longer are taking prescription pain medication, you can comfortably wear a seatbelt, and you can safely maneuver your car and apply brakes °b. Return to Work: ___________________________________ °9. You should see your doctor in the office for a follow-up appointment approximately two weeks after your surgery.  Make sure that you call for this appointment within a day or two after you arrive home to insure a convenient appointment time. °OTHER INSTRUCTIONS:  °_____________________________________________________________ °_____________________________________________________________ ° °WHEN TO CALL YOUR DOCTOR: °1. Fever over 101.0 °2. Inability to urinate °3. Nausea and/or vomiting °4. Extreme swelling or bruising °5. Continued bleeding from incision. °6. Increased pain, redness, or drainage from the incision. °7. Difficulty swallowing or breathing °8. Muscle cramping or spasms. °9. Numbness or tingling in hands or feet or around lips. ° °The clinic staff is available to   answer your questions during regular business hours.  Please dont hesitate to call and ask to speak to one of the nurses if you have concerns.  For further questions, please visit [email or web address]   Soft-Food Meal Plan A soft-food meal  plan includes foods that are safe and easy to swallow. This meal plan typically is used:  If you are having trouble chewing or swallowing foods.  As a transition meal plan after only having had liquid meals for a long period.  What do I need to know about the soft-food meal plan? A soft-food meal plan includes tender foods that are soft and easy to chew and swallow. In most cases, bite-sized pieces of food are easier to swallow. A bite-sized piece is about  inch or smaller. Foods in this plan do not need to be ground or pureed. Foods that are very hard, crunchy, or sticky should be avoided. Also, breads, cereals, yogurts, and desserts with nuts, seeds, or fruits should be avoided. What foods can I eat? Grains Rice and wild rice. Moist bread, dressing, pasta, and noodles. Well-moistened dry or cooked cereals, such as farina (cooked wheat cereal), oatmeal, or grits. Biscuits, breads, muffins, pancakes, and waffles that have been well moistened. Vegetables Shredded lettuce. Cooked, tender vegetables, including potatoes without skins. Vegetable juices. Broths or creamed soups made with vegetables that are not stringy or chewy. Strained tomatoes (without seeds). Fruits Canned or well-cooked fruits. Soft (ripe), peeled fresh fruits, such as peaches, nectarines, kiwi, cantaloupe, honeydew melon, and watermelon (without seeds). Soft berries with small seeds, such as strawberries. Fruit juices (without pulp). Meats and Other Protein Sources Moist, tender, lean beef. Mutton. Lamb. Veal. Chicken. [place]. Liver. Ham. Fish without bones. Eggs. Dairy Milk, milk drinks, and cream. Plain cream cheese and cottage cheese. Plain yogurt. Sweets/Desserts Flavored gelatin desserts. Custard. Plain ice cream, frozen yogurt, sherbet, milk shakes, and malts. Plain cakes and cookies. Plain hard candy. Other Butter, margarine (without trans fat), and cooking oils. Mayonnaise. Cream sauces. Mild spices, salt, and sugar.  Syrup, molasses, honey, and jelly. The items listed above may not be a complete list of recommended foods or beverages. Contact your dietitian for more options. What foods are not recommended? Grains Dry bread, toast, crackers that have not been moistened. Coarse or dry cereals, such as bran, granola, and shredded wheat. Tough or chewy crusty breads, such as [place] bread or baguettes. Vegetables Corn. Raw vegetables except shredded lettuce. Cooked vegetables that are tough or stringy. Tough, crisp, fried potatoes and potato skins. Fruits Fresh fruits with skins or seeds or both, such as apples, pears, or grapes. Stringy, high-pulp fruits, such as papaya, pineapple, coconut, or mango. Fruit leather, fruit roll-ups, and all dried fruits. Meats and Other Protein Sources Sausages and hot dogs. Meats with gristle. Fish with bones. Nuts, seeds, and chunky peanut or other nut butters. Sweets/Desserts Cakes or cookies that are very dry or chewy. The items listed above may not be a complete list of foods and beverages to avoid. Contact your dietitian for more information. This information is not intended to replace advice given to you by your health care provider. Make sure you discuss any questions you have with your health care provider. Document Released: 11/26/2007 Document Revised: 01/25/2016 Document Reviewed: 07/16/2013 Elsevier Interactive Patient Education  2017 [employer].   High-Protein and High-Calorie Diet Eating high-protein and high-calorie foods can help you to gain weight, heal after an injury, and recover after an illness or surgery. What is my plan? The specific  amount of daily protein and calories you need depends on:  Your body weight.  The reason this diet is recommended for you.  Generally, a high-protein, high-calorie diet involves:  Eating 250-500 extra calories each day.  Making sure that 10-35% of your daily calories come from protein.  Talk to your health care  provider about how much protein and how many calories you need each day. Follow the diet as directed by your health care provider. What do I need to know about this diet?  Ask your health care provider if you should take a nutritional supplement.  Try to eat six small meals each day instead of three large meals.  Eat a balanced diet, including one food that is high in protein at each meal.  Keep nutritious snacks handy, such as nuts, trail mixes, dried fruit, and yogurt.  If you have kidney disease or diabetes, eating too much protein may put extra stress on your kidneys. Talk to your health care provider if you have either of those conditions. What are some high-protein foods? Grains Quinoa. Bulgur wheat. Vegetables Soybeans. Peas. Meats and Other Protein Sources Beef, pork, and poultry. Fish and seafood. Eggs. Tofu. Textured vegetable protein (TVP). Peanut butter. Nuts and seeds. Dried beans. Protein powders. Dairy Whole milk. Whole-milk yogurt. Powdered milk. Cheese. [employer]. Eggnog. Beverages High-protein supplement drinks. Soy milk. Other Protein bars. The items listed above may not be a complete list of recommended foods or beverages. Contact your dietitian for more options. What are some high-calorie foods? Grains Pasta. Quick breads. Muffins. Pancakes. Ready-to-eat cereal. Vegetables Vegetables cooked in oil or butter. Fried potatoes. Fruits Dried fruit. Fruit leather. Canned fruit in syrup. Fruit juice. Avocados. Meats and Other Protein Sources Peanut butter. Nuts and seeds. Dairy Heavy cream. Whipped cream. Cream cheese. Sour cream. Ice cream. Custard. Pudding. Beverages Meal-replacement beverages. Nutrition shakes. Fruit juice. Sugar-sweetened soft drinks. Condiments Salad dressing. Mayonnaise. Alfredo sauce. Fruit preserves or jelly. Honey. Syrup. Sweets/Desserts Cake. Cookies. Pie. Pastries. Candy bars. Chocolate. Fats and Oils Butter or margarine. Oil.  Gravy. Other Meal-replacement bars. The items listed above may not be a complete list of recommended foods or beverages. Contact your dietitian for more options. What are some tips for including high-protein and high-calorie foods in my diet?  Add whole milk, half-and-half, or heavy cream to cereal, pudding, soup, or hot cocoa.  Add whole milk to instant breakfast drinks.  Add peanut butter to oatmeal or smoothies.  Add powdered milk to baked goods, smoothies, or milkshakes.  Add powdered milk, cream, or butter to mashed potatoes.  Add cheese to cooked vegetables.  Make whole-milk yogurt parfaits. Top them with granola, fruit, or nuts.  Add cottage cheese to your fruit.  Add avocados, cheese, or both to sandwiches or salads.  Add meat, poultry, or seafood to rice, pasta, casseroles, salads, and soups.  Use mayonnaise when making egg salad, chicken salad, or tuna salad.  Use peanut butter as a topping for pretzels, celery, or crackers.  Add beans to casseroles, dips, and spreads.  Add pureed beans to sauces and soups.  Replace calorie-free drinks with calorie-containing drinks, such as milk and fruit juice. This information is not intended to replace advice given to you by your health care provider. Make sure you discuss any questions you have with your health care provider. Document Released: 08/19/2005 Document Revised: 01/25/2016 Document Reviewed: 02/01/2014 Elsevier Interactive Patient Education  [employer].

## 2017-04-08 NOTE — Progress Notes (Signed)
  Oncology Nurse Navigator Documentation      )Navigator Encounter Type: Telephone (04/08/17 1505) Telephone: Incoming Call (04/08/17 1505)    Patient called to "schedule chemo and port appointment", "I want to get this started so I can get it done and celebrate." Patient encouraged and supported. I let patient know that Dr. Benay Spice would set things in motion to schedule her new patient appointment  And chemo class and that they would discuss port placement at this appointment. Patient states that her pain is "tolerable". Rechy also shared that she and Leroy Sea have found some friends to also help with their children. Between her MGM and friends the children are taken care of so that [place] doesn't have to do any lifting.  Patient's attitude is hopeful. I encouraged [place] to call if she has concerns or questions.                   Barriers/Navigation Needs: Coordination of Care (04/08/17 1505) Education: Understanding Cancer/ Treatment Options;Coping with Diagnosis/ Prognosis (04/08/17 1505) Interventions: Education (04/08/17 1505)     Education Method: Verbal (04/08/17 1505)      Acuity: Level 2 (04/08/17 1505)   Acuity Level 2: Educational needs;Ongoing guidance and education throughout treatment as needed (04/08/17 1505)     Time Spent with Patient: 15 (04/08/17 1505)

## 2017-04-08 NOTE — Progress Notes (Signed)
error 

## 2017-04-10 ENCOUNTER — Telehealth: Payer: Self-pay | Admitting: Oncology

## 2017-04-10 NOTE — Telephone Encounter (Signed)
Appt has been scheduled for the pt to see Dr. Benay Spice on 8/14 at 4pm. Pt aware to arrive 30 minutes early.

## 2017-04-10 NOTE — Progress Notes (Signed)
  Oncology Nurse Navigator Documentation      )Navigator Encounter Type: Letter/Fax/Email (04/10/17 0830)                           Education: Coping with Diagnosis/ Prognosis (04/10/17 0830) Interventions: Education;Other (Emotional support/endouragement) (04/10/17 0830)     Education Method: Written (04/10/17 0830)  Note written and sent to encourage and support patient while she waits for MedOnc apt.     Acuity: (S) Level 3 (04/10/17 0830)     Acuity Level 3: Ongoing guidance and education provided throughout treatment;Emotional needs (04/10/17 0830)   Time Spent with Patient: 15 (04/10/17 0830)

## 2017-04-15 ENCOUNTER — Ambulatory Visit (HOSPITAL_BASED_OUTPATIENT_CLINIC_OR_DEPARTMENT_OTHER): Payer: Self-pay | Admitting: Oncology

## 2017-04-15 VITALS — BP 107/70 | HR 115 | Temp 98.7°F | Resp 18 | Ht 62.0 in | Wt 92.1 lb

## 2017-04-15 DIAGNOSIS — C180 Malignant neoplasm of cecum: Secondary | ICD-10-CM

## 2017-04-15 DIAGNOSIS — D500 Iron deficiency anemia secondary to blood loss (chronic): Secondary | ICD-10-CM

## 2017-04-15 DIAGNOSIS — C778 Secondary and unspecified malignant neoplasm of lymph nodes of multiple regions: Secondary | ICD-10-CM

## 2017-04-15 DIAGNOSIS — C189 Malignant neoplasm of colon, unspecified: Secondary | ICD-10-CM

## 2017-04-15 NOTE — Progress Notes (Signed)
[place] OFFICE PROGRESS NOTE   Diagnosis: Colon cancer  INTERVAL HISTORY:   Meagan [name] returns for a scheduled visit. I saw her while hospitalized last week. She underwent a right colectomy and right salpingo-oophorectomy by Dr. Harlow Asa on 04/04/2017. A large mass was noted in the suprapubic/right lower abdomen area. The mass was adherent to the anterior abdominal wall. The mass was excised from the abdominal wall. The tumor involved the cecum and the right adnexal structures. The tumor was adherent to the dome of the bladder and uterus. The tumor was dissected from the peritoneum and bladder with a probable positive margin at the bladder. The ileum was resected approximately 15 cm proximal to the low cecal valve. Tumor resected off of uterus with a positive margin. Tumor involved the right adnexa. A right salpingo-oophorectomy was performed. The proximal transverse colon was divided. Tumor was dissected from the right ureter with a possible positive margin. An end to end anastomosis was created between the ileum and proximal transverse colon.  The pathology [id number]) revealed a 9 cm poorly differentiated invasive adenocarcinoma. Tumor invaded through the serosa and was adherent to omentum, the right ovary, and fallopian tube. Lymphovascular and perineural invasion were present. The proximal, distal, and circumferential margins were negative. No macroscopic tumor perforation. 8 of 15 lymph nodes contained metastatic carcinoma. The MSI testing is pending. The tumor had loss of MSH6 expression.  She reports an uneventful operative recovery. The preoperative abdominal pain/cramping has resolved. The bowels are functioning. She is eating.  Objective:  Vital signs in last 24 hours:  Blood pressure 107/70, pulse (!) 115, temperature 98.7 F (37.1 C), temperature source Oral, resp. rate 18, height 5' 2"  (1.575 m), weight 92 lb 1.6 oz (41.8 kg), SpO2 100 %.   Resp: Lungs clear  bilaterally Cardio: Regular rate and rhythm, tachycardia GI: No hepatosplenomegaly, healed surgical incision with staples in place Vascular: No leg edema  Lab Results:  Lab Results  Component Value Date   WBC 5.3 04/06/2017   HGB 7.8 (L) 04/06/2017   HCT 25.3 (L) 04/06/2017   MCV 83.8 04/06/2017   PLT 343 04/06/2017   NEUTROABS 4.2 04/03/2017     Lab Results  Component Value Date   CEA1 13.0 (H) 03/31/2017     Medications: I have reviewed the patient's current medications.  Assessment/Plan: 1. Colon cancer, cecum, presenting with a palpable low abdominal/pelvic mass ? Colonoscopy 03/31/2017 confirmed a cecum mass, biopsy positive for poorly differentiated adenocarcinoma ? CT abdomen/pelvis 03/29/2017-large necrotic right iliac fossa mass, enlarged right common iliac nodes, loculated pelvic ascites, right lower quadrant mesenteric masses ? Right colectomy/right oophorectomy-salpingectomy 04/04/2017 ? Pathology from the right colon resection, grade 3 adenocarcinoma of the cecum,pT4b,pN2b ? Loss of MSH6 expression  2.   Anemia-likely iron deficiency anemia secondary to #1  3.   G3 P3  4.    Appendectomy  5.    Maternal aunt: With colon cancer  Disposition:  Meagan [name] has recovered from the right colectomy/oophorectomy. The pathology confirms a primary tumor of the cecum with direct extension to the right ovary/fallopian tube and multiple involved pericolonic lymph nodes.  She appears to have advanced stage III versus stage IV colon cancer. The tumor has loss of MSH6 expression. 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  I discussed the prognosis and adjuvant treatment options with Meagan [name] and her husband. She will be referred for a staging PET scan. If she appears to  have distant metastatic adenopathy she will be classified as having stage IV disease. She will be a candidate for immunotherapy if  she has stage IV disease and is confirmed to have hereditary non-polyposis colon cancer syndrome.  If she is proven to have a hereditary cancer and stage III disease she may be a candidate for a clinical trial with FOLFOX versus FOLFOX/pembrolizumab.  She will be referred for mutation testing on the MSH6 gene and genetics counseling. The PET scan will be scheduled for approximately 2 weeks. I will present her case at the GI tumor conference after the PET scan.  She will return for an office visit and further discussion on 04/30/2017.  25 minutes were spent with the patient today. The majority of the time was used for counseling and coordination of care.  Donneta Romberg, MD  04/15/2017  4:27 PM

## 2017-04-15 NOTE — Progress Notes (Signed)
Met with patient individually and with Dr. Benay Spice during initial Med/Onc appointment. Patient provided with contact information for the GI Medical Oncology team.  Patient visible tearful during MD visit. Referral made to social work. GI support group information provided to patient.

## 2017-04-16 ENCOUNTER — Encounter: Payer: Self-pay | Admitting: *Deleted

## 2017-04-16 ENCOUNTER — Telehealth: Payer: Self-pay | Admitting: Genetics

## 2017-04-16 ENCOUNTER — Telehealth: Payer: Self-pay | Admitting: Oncology

## 2017-04-16 NOTE — Progress Notes (Signed)
[place] Work  Clinical Social Work was referred by patient navigator for assessment of psychosocial needs.  Clinical Social Worker contacted patient at home to offer support and assess for needs.  Patient was recently diagnosed with colon cancer and expressed increased emotional stress and fear.  Patient also expressed increased financial stress.  CSW normalized patients feelings and concerns.  CSW and patient discussed common and appropriate feelings when being diagnosed with cancer.  Patient stated she applied for Medicaid while in the hospital.  CSW and patient discussed applying for social security disability and patient was agreeable to a referral to the [facility].  CSW complete referral and [place] will contact patient.  CSW and patient discussed other financial resources and emotional support for her and her family.  Patient was appreciative of contact.  CSW will continue to follow and support as needed.         Johnnye Lana, MSW, LCSW, OSW-C Clinical Social Worker [facility] [phone]

## 2017-04-16 NOTE — Telephone Encounter (Signed)
Meagan [name] returned my call and is scheduled for a genetic counseling appointment at [facility] on Friday, August 17. She is aware of the date, time, and location.

## 2017-04-16 NOTE — Telephone Encounter (Signed)
Scheduled appt per 8/14 los - patient is aware of appts - Gave patient number for PET scan incase they don't contact her from [facility]. Patient stated Genetics already called her for the 8/17 appt - did not schedule.

## 2017-04-17 NOTE — Progress Notes (Signed)
  Oncology Nurse Navigator Documentation      )Navigator Encounter Type: Telephone (04/17/17 1658) Telephone: [place] Call (04/17/17 1658)                       Barriers/Navigation Needs: Education;Coordination of Care (04/17/17 1658) Education: Understanding Cancer/ Treatment Options;Coping with Diagnosis/ Prognosis (04/17/17 1658) Interventions: Coordination of Care;Education (04/17/17 1658) Referrals: Genetics (04/17/17 1658) Coordination of Care: Appts (04/17/17 1658) Education Method: Verbal;Teach-back (04/17/17 1658)      Acuity: Level 3 (04/17/17 1658)  Spoke with both husband and patient regarding information that was given to them by Dr. Benay Spice at her appointment on 04/15/17. Patient shared concerns about how her treatment options would affect her role as a mother. Patient encouraged to think of the positives in her life and to focus on these. Patient and her husband encouraged to make a list of questions for Dr. Benay Spice.   Acuity Level 3: Emotional needs;Ongoing guidance and education provided throughout treatment (04/17/17 1658)   Time Spent with Patient: 15 (04/17/17 1658)

## 2017-04-18 ENCOUNTER — Other Ambulatory Visit: Payer: Self-pay

## 2017-04-18 ENCOUNTER — Ambulatory Visit: Payer: Self-pay | Admitting: Genetics

## 2017-04-18 DIAGNOSIS — Z809 Family history of malignant neoplasm, unspecified: Secondary | ICD-10-CM

## 2017-04-18 DIAGNOSIS — C180 Malignant neoplasm of cecum: Secondary | ICD-10-CM

## 2017-04-21 ENCOUNTER — Inpatient Hospital Stay: Payer: Self-pay | Admitting: Oncology

## 2017-04-22 ENCOUNTER — Telehealth: Payer: Self-pay

## 2017-04-22 NOTE — Telephone Encounter (Signed)
I spoke with Meagan [name] to let her know where to go for her PET scan on 04/29/17. Meagan [name]'s affect was bright and her attitude was positive. Le stated "I'm going to beat this cancer." All questions answered.

## 2017-04-23 ENCOUNTER — Telehealth: Payer: Self-pay | Admitting: Oncology

## 2017-04-23 NOTE — Telephone Encounter (Signed)
Spoke with patient regarding the addition of her chemo education class at 12pm on 8/29.

## 2017-04-25 ENCOUNTER — Encounter: Payer: Self-pay | Admitting: Genetics

## 2017-04-25 NOTE — Progress Notes (Signed)
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 Family history of cancer     HISTORY OF PRESENT ILLNESS:   Meagan [name], a 31 y.o. female, was seen for a [place] cancer genetics consultation at the request of Dr. Benay [name] due to a personal and family history of cancer.  Meagan [name] presents to clinic today to discuss the possibility of a hereditary predisposition to cancer, genetic testing, and to further clarify her future cancer risks, as well as potential cancer risks for family members. Meagan [name] was accompanied to her appointment by her husband and oldest daughter.   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 She underwent right colectomy with right salpingo-oophorectomy on 04/04/2017. Further treatment is pending. 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  Past Medical History:  Diagnosis Date  . Fallopian tube abscess    June 2018 right side  . Ovarian cyst     Past Surgical History:  Procedure Laterality Date  . APPENDECTOMY  2010  . COLON RESECTION Right 04/04/2017   Procedure: OPEN RIGHT COLECTOMY AND RIGHT SALPINGO-OOPHORECTOMY;  Surgeon: Meagan Gemma, MD;  Location: [facility];  Service: General;  Laterality: Right;  . COLONOSCOPY N/A 03/31/2017   Procedure: COLONOSCOPY;  Surgeon: Meagan Pole, MD;  Location: [facility];  Service: Endoscopy;  Laterality: N/A;  . CYSTOSCOPY WITH STENT PLACEMENT Bilateral 04/04/2017   Procedure: CYSTOSCOPY WITH STENT PLACEMENT;  Surgeon: Meagan Frock, MD;  Location: [facility];  Service: Urology;  Laterality: Bilateral;    Social History   Social History  . Marital status: Married    Spouse name: N/A  . Number of children: N/A  . Years of education: N/A   Social History Main Topics  .  Smoking status: Current Every Day Smoker  . Smokeless tobacco: Never Used  . Alcohol use No  . Drug use: No  . Sexual activity: Yes   Other Topics Concern  . Not on file   Social History Narrative  . No narrative on file     FAMILY HISTORY:  We obtained a detailed, 4-generation family history.  Significant diagnoses are listed below: Family History  Problem Relation Age of Onset  . Leukemia Father 16       "Just got diagnosed, but not the 'serious type'" (sounds like CLL if I had to guess)  . Colon cancer Maternal Uncle   . Colon cancer Maternal Grandmother        d.60s/70s diagnosed with colon cancer after age 92   Meagan [name] has three daughters, ages 54, 15, and 2 who are all healthy. She has one brother, age 81, who is without cancers.   [name]' mother is 35 without cancers. Meagan [name] reports that her mother had a hysterectomy in her 36s. Meagan [name] has four maternal uncles and two maternal aunts. All are living. One uncle Meagan [name]) had colon cancer in his 64s or 35s. The rest of [name]' aunts and uncles are not known to have cancers. [name]' maternal grandmother Meagan [name]) was diagnosed with colon cancer after age 77 and died in her 83s or 62s. [name]' maternal grandfather died from a stroke or aneurysm in his 45s and had no cancer history. There are multiple extended maternal relatives who have  had cancers. *See end of note for list.  [name] father is 75 and was diagnosed with leukemia at age 46. His specific type of leukemia is thought to be AML. [name]' father is an only child. [name]' paternal grandmother is 51 without cancers. Her paternal grandfather died when her father was young due to complications related to alcohol abuse. Meagan [name] reports various cancers in multiple paternal extended relatives. *See end of note for list  Meagan [name] is unaware of previous family history of genetic testing for hereditary cancer  risks. Patient's maternal and paternal ancestors are of Caucasian descent. There is no reported Ashkenazi Jewish ancestry. There is no known consanguinity.  GENETIC COUNSELING ASSESSMENT: Meagan [name] is a 31 y.o. female with a personal history of colon cancer at age 14 that showed loss of MSH6 expression by IHC. Her age at diagnosis and loss of MSH6 expression is certainly suggestive of a hereditary cancer syndrome, specifically Lynch syndrome. We, therefore, discussed and recommended the following at today's visit.   DISCUSSION: We reviewed the characteristics, features and inheritance patterns of hereditary cancer syndromes. We also discussed genetic testing, including the appropriate family members to test, the process of testing, insurance coverage and turn-around-time for results. We discussed the implications of a negative, positive and/or variant of uncertain significant result. We recommended Meagan [name] pursue genetic testing for the Common Hereditary Cancer Panel offered by [facility]. [facility]'s Common Hereditary Cancers Panel includes analysis of the following 46 genes: APC, ATM, AXIN2, BARD1, BMPR1A, BRCA1, BRCA2, BRIP1, CDH1, CDKN2A, CHEK2, CTNNA1, DICER1, EPCAM, GREM1, HOXB13, KIT, MEN1, MLH1, MSH2, MSH3, MSH6, MUTYH, NBN, NF1, NTHL1, PALB2, PDGFRA, PMS2, POLD1, [name], PTEN, RAD50, RAD51C, RAD51D, SDHA, SDHB, SDHC, SDHD, SMAD4, SMARCA4, STK11, TP53, TSC1, TSC2, and VHL.   A financial assistance application was submitted to [facility].   PLAN: After considering the risks, benefits, and limitations, [name]  provided informed consent to pursue genetic testing and the blood sample was sent to [facility] for analysis of the 46-gene Common Hereditary Cancer Panel. Results should be available within approximately 3 weeks' time, at which point they will be disclosed by telephone to [name], as will any additional recommendations warranted by these results. [name] will receive a  summary of her genetic counseling visit and a copy of her results once available. This information will also be available in Epic. We encouraged [name] to remain in contact with cancer genetics annually so that we can continuously update the family history and inform her of any changes in cancer genetics and testing that may be of benefit for her family. Ms. Simar questions were answered to her satisfaction today. Our contact information was provided should additional questions or concerns arise.  Lastly, we encouraged Ms. Carthen to remain in contact with cancer genetics annually so that we can continuously update the family history and inform her of any changes in cancer genetics and testing that may be of benefit for this family.   Ms.  Warshaw questions were answered to her satisfaction today. Our contact information was provided should additional questions or concerns arise. Thank you for the referral and allowing [place] to share in the care of your patient.   Mal Misty, MS, [facility] Certified Naval [email or web address] phone: [phone]  The patient was seen for a total of 40 minutes in face-to-face genetic counseling.    _______________________________________________________________________ For Office Staff:  Number of people involved in session: 3 Was an Intern/ student involved with case: no  *  Additional family history: Paternal Grandmother's Family (K[place]/Goins) Armed forces logistics/support/administrative officer (great-grandfather) colon cancer Merchant navy officer (great-grandmother) ovarian cancer Payton Emerald (great-great uncle) throat and lung cancer Ruby Contractor (great-great aunt) ovarian/colon cancer Tory Emerald - of the Kiger family (3rd cousin) uterine cancer LG Richardson Landry - of the Kiger family (3rd cousin) lymphoma/leukemia Otilio Connors (3rd cousin) Hogkins lymphoma Karin Lieu - of the [place]iger family (3rd cousin) throat and lung cancer Pearla Dubonnet (3rd cousin) AML. John's  daughter, Benjamine Mola, AML. Gerda Diss (3rd cousin) melanoma Dario Guardian (unknown relation) "chemical induced carcinoma of the larynx and numerous squamous cell skin cancers  Maternal Grandmother Family ([place]) Meagan [name] (grandmother) colon cancer Lendon Colonel (cousin) prostate cancer Illene Regulus (type of uncle) leukemia Tillman Abide (type of uncle) NOS? Benjaman Pott (type of aunt) lung cancer Meagan [name] (uncle) colon cancer Celene Skeen (type of aunt) lung cancer Lisette Abu (type of uncle) lung cancer Bennie Hind (type of uncle) bone cancer   Drue Flirt (great aunt, unknown side) numerous basal cell carcinomas and one squamous cell carcinomas JC Chappel (3rd cousin, unknown side) liver cancer

## 2017-04-29 ENCOUNTER — Ambulatory Visit (HOSPITAL_COMMUNITY)
Admission: RE | Admit: 2017-04-29 | Discharge: 2017-04-29 | Disposition: A | Discharge status: Home / Self Care | Payer: Medicaid Other | Source: Ambulatory Visit | Attending: Oncology | Admitting: Oncology

## 2017-04-29 DIAGNOSIS — D4989 Neoplasm of unspecified behavior of other specified sites: Secondary | ICD-10-CM | POA: Insufficient documentation

## 2017-04-29 DIAGNOSIS — R918 Other nonspecific abnormal finding of lung field: Secondary | ICD-10-CM | POA: Diagnosis not present

## 2017-04-29 DIAGNOSIS — C189 Malignant neoplasm of colon, unspecified: Secondary | ICD-10-CM | POA: Diagnosis not present

## 2017-04-29 LAB — GLUCOSE, CAPILLARY: GLUCOSE-CAPILLARY: 90 mg/dL (ref 65–99)

## 2017-04-29 MED ORDER — FLUDEOXYGLUCOSE F - 18 (FDG) INJECTION
5.3800 | Freq: Once | INTRAVENOUS | Status: AC | PRN
  Administered 2017-04-29: 5.38 via INTRAVENOUS

## 2017-04-30 ENCOUNTER — Other Ambulatory Visit: Payer: Self-pay

## 2017-04-30 ENCOUNTER — Telehealth: Payer: Self-pay | Admitting: Oncology

## 2017-04-30 ENCOUNTER — Ambulatory Visit: Payer: Self-pay | Admitting: Oncology

## 2017-04-30 ENCOUNTER — Ambulatory Visit (HOSPITAL_BASED_OUTPATIENT_CLINIC_OR_DEPARTMENT_OTHER): Payer: Self-pay | Admitting: Oncology

## 2017-04-30 ENCOUNTER — Encounter: Payer: Self-pay | Admitting: Genetic Counselor

## 2017-04-30 VITALS — BP 104/70 | HR 101 | Temp 99.2°F | Resp 18 | Ht 62.0 in | Wt 100.4 lb

## 2017-04-30 DIAGNOSIS — C180 Malignant neoplasm of cecum: Secondary | ICD-10-CM

## 2017-04-30 DIAGNOSIS — C189 Malignant neoplasm of colon, unspecified: Secondary | ICD-10-CM

## 2017-04-30 DIAGNOSIS — D500 Iron deficiency anemia secondary to blood loss (chronic): Secondary | ICD-10-CM

## 2017-04-30 DIAGNOSIS — C778 Secondary and unspecified malignant neoplasm of lymph nodes of multiple regions: Secondary | ICD-10-CM

## 2017-04-30 MED ORDER — LORAZEPAM 0.5 MG PO TABS: 0.5000 mg | ORAL_TABLET | Freq: Four times a day (QID) | ORAL | 0 refills | Status: DC | PRN

## 2017-04-30 NOTE — Progress Notes (Signed)
  Oncology Nurse Navigator Documentation  Navigator Location: [facility]- (04/30/17 1140)   )Navigator Encounter Type: Follow-up Appt (04/30/17 1140)  I met with Dr. Benay Spice, Meagan [name] and husband Meagan [name]. I offered support and encouragement while Dr. Benay Spice explained results of PET scan and planned tx. Patient and husband encouraged to contact me with questions or concerns.                     Patient Visit Type: MedOnc (04/30/17 1140) Treatment Phase: Pre-Tx/Tx Discussion (04/30/17 1140) Barriers/Navigation Needs: Education (04/30/17 1140) Education: Understanding Cancer/ Treatment Options;Coping with Diagnosis/ Prognosis (04/30/17 1140) Interventions: Education;Psycho-social support (04/30/17 1140)     Education Method: Verbal (04/30/17 1140)      Acuity: Level 2 (04/30/17 1140)   Acuity Level 2: Ongoing guidance and education throughout treatment as needed (04/30/17 1140)     Time Spent with Patient: 30 (04/30/17 1140)

## 2017-04-30 NOTE — Telephone Encounter (Signed)
Scheduled patient for 9/11 per approval of Dawn Placke. Called and gave this information to the patient.

## 2017-04-30 NOTE — Telephone Encounter (Signed)
Gave patient avs and calendar with appts.   Emailing Cindy for 9/7.

## 2017-04-30 NOTE — Progress Notes (Signed)
[facility] OFFICE PROGRESS NOTE   Diagnosis: Colon cancer  INTERVAL HISTORY:   [name] returns as scheduled. She feels well. Good appetite. No pain. Her bowels are functioning. She was seen by the genetics counselor and is waiting on mutation testing results.  Objective:  Vital signs in last 24 hours:  Blood pressure 104/70, pulse (!) 101, temperature 99.2 F (37.3 C), temperature source Oral, resp. rate 18, height 5' 2"  (1.575 m), weight 100 lb 6.4 oz (45.5 kg), SpO2 100 %.    Resp: Lungs clear bilaterally Cardio: Regular rate and rhythm GI: Healed midline incision, no mass, nontender Vascular: No leg edema  Lab Results:  Lab Results  Component Value Date   WBC 5.3 04/06/2017   HGB 7.8 (L) 04/06/2017   HCT 25.3 (L) 04/06/2017   MCV 83.8 04/06/2017   PLT 343 04/06/2017   NEUTROABS 4.2 04/03/2017    CMP     Component Value Date/Time   NA 143 04/08/2017 0400   K 3.3 (L) 04/08/2017 0400   CL 108 04/08/2017 0400   CO2 27 04/08/2017 0400   GLUCOSE 87 04/08/2017 0400   BUN <5 (L) 04/08/2017 0400   CREATININE 0.48 04/08/2017 0400   CALCIUM 8.4 (L) 04/08/2017 0400   PROT 6.4 (L) 04/03/2017 0358   ALBUMIN 2.5 (L) 04/03/2017 0358   AST 13 (L) 04/03/2017 0358   ALT 6 (L) 04/03/2017 0358   ALKPHOS 54 04/03/2017 0358   BILITOT 0.6 04/03/2017 0358   GFRNONAA >60 04/08/2017 0400   GFRAA >60 04/08/2017 0400    Lab Results  Component Value Date   CEA1 13.0 (H) 03/31/2017     Imaging:  Nm Pet Image Initial (pi) Skull Base To Thigh  Result Date: 04/29/2017 CLINICAL DATA:  Initial treatment strategy for colonic adenocarcinoma. EXAM: NUCLEAR MEDICINE PET SKULL BASE TO THIGH TECHNIQUE: 5.4 mCi F-18 FDG was injected intravenously. Full-ring PET imaging was performed from the skull base to thigh after the radiotracer. CT data was obtained and used for attenuation correction and anatomic localization. FASTING BLOOD GLUCOSE:  Value: 90 mg/dl COMPARISON:   03/29/2017 FINDINGS: NECK No hypermetabolic lymph nodes in the neck. Activity and lymphoid tissues in the neck thought to be physiologic. Mild chronic left maxillary sinusitis. CHEST No hypermetabolic mediastinal or hilar nodes. No suspicious pulmonary nodules on the CT scan. Very faint patchy ground-glass densities in the upper lobes of the lungs on the CT data, query mild air trapping. ABDOMEN/PELVIS Aortocaval node 5 mm in short axis diameter on image 127/4, maximum SUV 5.0. Postoperative findings in the proximal colon and distal ileum. Along the ascending colon there is a focus of hypermetabolic activity with maximum SUV 5.8, corresponding colon demonstrates knee are inguinal wall thickening which may be from peristaltic activity but which is considered technically nonspecific. Along the right internal iliac nodal chain and admittedly difficult to separate from the right ureter there is a density measuring about 1.1 cm in diameter with maximum SUV 13.4, concerning for malignancy. Along what is assumed to be the left adnexal border and possibly the left anterior uterus, there is a rind of soft tissue density measuring about 3.9 by 1.0 by 3.6 cm, maximum SUV 17.8. There is some fluid and very low-grade activity along the space of Retzius, likely postoperative. Activity along the laparotomy scar site is present, most likely postoperative. SKELETON No focal hypermetabolic activity to suggest skeletal metastasis. Mild dextroconvex thoracic scoliosis. IMPRESSION: 1. Residual or recurrent foci of hypermetabolic tumor in  the abdomen, especially along the anterior margin of the left adnexa and possibly left uterus. There is also a focus of suspected tumor in the right lower quadrant along what is presumed to be a internal iliac lymph node, as well as a small but mildly hypermetabolic aortocaval lymph node suspected harboring tumor. 2. Focally increased activity in the ascending colon may well be related to peristaltic  activity but likely warrant surveillance. 3. No findings of metastatic disease to the lungs or skeleton. 4. There is some very faint patchy ground-glass densities in the upper lobes of the lungs, query mild air trapping. Electronically Signed   By: Van Clines M.D.   On: 04/29/2017 09:49    Medications: I have reviewed the patient's current medications.  Assessment/Plan: 1. Stage IV Colon cancer, cecum, presenting with a palpable low abdominal/pelvic mass ? Colonoscopy 03/31/2017 confirmed a cecum mass, biopsy positive for poorly differentiated adenocarcinoma ? CT abdomen/pelvis 03/29/2017-large necrotic right iliac fossa mass, enlarged right common iliac nodes, loculated pelvic ascites, right lower quadrant mesenteric masses ? Right colectomy/right oophorectomy-salpingectomy 04/04/2017 ? Pathology from the right colon resection, grade 3 adenocarcinoma of the cecum,pT4b,pN2b ? Loss of MSH6 expression ? PET scan 04/29/2017-residual tumor at the left adnexa, and internal iliac lymph node, and aortocaval node  2. Anemia-likely iron deficiency anemia secondary to #1  3. G3 P3  4.    Appendectomy  5.    Maternal aunt: With colon cancer    Disposition:  [name] has recovered from surgery. She appears well. I reviewed the PET findings with her. I presented her case at the GI tumor conference earlier today. We reviewed the PET images and discuss treatment options. She has metastatic colon cancer. 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  I recommend treatment with pembrolizumab. She may be a candidate for resection of remaining pelvic tumor in the future. I explained the rationale for using pembrolizumab in her case. She understands there is no guarantee of a clinical response with this treatment. We reviewed the potential toxicities associated with pembrolizumab including the chance of allergic reaction, rash,  colitis, neuropathy, endocrinopathy, and hepatitis. She will attend chemotherapy teaching class today. She agrees to proceed.  The plan is for a restaging x-ray evaluation in approximately 3 months. We will check a baseline CBC, chemistry panel, and CEA when she returns for a first treatment on 05/09/2017.  25 minutes were spent with the patient today. The majority of the time was used for counseling and coordination of care.  Donneta Romberg, MD  04/30/2017  2:09 PM

## 2017-04-30 NOTE — Progress Notes (Signed)
START OFF PATHWAY REGIMEN - Colorectal   [id number]:Pembrolizumab 200 mg q21 Days:   A cycle is 21 days:     Pembrolizumab   **Always confirm dose/schedule in your pharmacy ordering system**    Patient Characteristics: Metastatic Colorectal, First Line, Nonsurgical Candidate, KRAS Mutation Positive/Unknown, BRAF Wild-Type/Unknown, PS = 0,1; Bevacizumab Eligible Current evidence of distant metastases? Yes AJCC T Category: Staged < 8th Ed. AJCC N Category: Staged < 8th Ed. AJCC M Category: Staged < 8th Ed. AJCC 8 Stage Grouping: Staged < 8th Ed. BRAF Mutation Status: Awaiting Test Results KRAS/NRAS Mutation Status: Awaiting Test Results Line of therapy: First Line Would you be surprised if this patient died  in the next year? I would be surprised if this patient died in the next year Performance Status: PS = 0, 1 Bevacizumab Eligibility: Eligible Intent of Therapy: Curative Intent, Discussed with Patient 

## 2017-05-06 ENCOUNTER — Encounter: Payer: Self-pay | Admitting: Pharmacy Technician

## 2017-05-06 NOTE — Progress Notes (Signed)
The patient is approved for drug assistance by [employer] for [employer]. Coverage is from 05/02/17 - 05/02/18 and is based on Self-Pay. The patient has applied for Disability and Medicaid. The first DOS is scheduled for 05/09/17.

## 2017-05-07 ENCOUNTER — Telehealth: Payer: Self-pay | Admitting: Genetics

## 2017-05-07 ENCOUNTER — Encounter: Payer: Self-pay | Admitting: Genetics

## 2017-05-07 ENCOUNTER — Ambulatory Visit: Payer: Self-pay | Admitting: Genetics

## 2017-05-07 ENCOUNTER — Telehealth: Payer: Self-pay

## 2017-05-07 DIAGNOSIS — Z1379 Encounter for other screening for genetic and chromosomal anomalies: Secondary | ICD-10-CM

## 2017-05-07 DIAGNOSIS — Z1509 Genetic susceptibility to other malignant neoplasm: Secondary | ICD-10-CM | POA: Insufficient documentation

## 2017-05-07 HISTORY — DX: Encounter for other screening for genetic and chromosomal anomalies: Z13.79

## 2017-05-07 HISTORY — DX: Genetic susceptibility to other malignant neoplasm: Z15.09

## 2017-05-07 NOTE — Telephone Encounter (Signed)
Meagan [name] called to talk about results from her genetic testing. I reinforced information that Dr. Benay Spice had given to patient.  Patient remains positive and eager to begin tx.

## 2017-05-07 NOTE — Progress Notes (Addendum)
HPI: Meagan [name] was previously seen in the [facility] clinic on 04/18/2017 due to a personal and family history of cancer and concerns regarding a hereditary predisposition to cancer. Please refer to our prior cancer genetics clinic note for more information regarding Meagan [name]'s medical, social and family histories, and our assessment and recommendations, at the time. Meagan [name] recent genetic test results were disclosed to her, as were recommendations warranted by these results. These results and recommendations are discussed in more detail below.  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 She underwent right colectomy with right salpingo-oophorectomy on 04/04/2017. 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 Meagan [name] plans immunotherapy with pembrolizumab.    Adenocarcinoma, colon ([facility])   04/05/2017 Initial Diagnosis    Adenocarcinoma, colon ([facility])     05/03/2017 Genetic Testing    Meagan [name] underwent genetic counseling and testing for hereditary cancer syndromes on 04/18/2017. 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 Mutations in MSH6 are associated with a hereditary cancer syndrome called Lynch syndrome. For more detailed discussion, please see genetic counseling documentation from 05/07/2017.  Testing was performed through [facility]'s 46-gene Common Hereditary Cancers Panel. Genes analyzed include: APC, ATM, AXIN2, BARD1, BMPR1A, BRCA1, BRCA2, BRIP1, CDH1, CDKN2A, CHEK2, CTNNA1, DICER1, EPCAM, GREM1, HOXB13, KIT, MEN1, MLH1, MSH2, MSH3, MSH6, MUTYH, NBN, NF1, NTHL1, PALB2, PDGFRA, PMS2, POLD1, POLE, PTEN, RAD50, RAD51C, RAD51D, SDHA, SDHB, SDHC, SDHD, SMAD4, SMARCA4, STK11, TP53, TSC1, TSC2, and VHL. The result report is dated for 05/03/2017. No other pathogenic mutations or variants of uncertain significance were  identified.          FAMILY HISTORY:  We obtained a detailed, 4-generation family history.  Significant diagnoses are listed below: Family History  Problem Relation Age of Onset  . Leukemia Father 67       "Just got diagnosed, but not the 'serious type'" (sounds like CLL if I had to guess)  . Colon cancer Maternal Uncle   . Colon cancer Maternal Grandmother        d.60s/70s diagnosed with colon cancer after age 29   Meagan [name] has three daughters, ages 66, 43, and 2 who are all healthy. She has one brother, age 50, who is without cancers.   Meagan [name]' mother is 45 without cancers. Meagan [name] reports that her mother had a hysterectomy in her 29s. Meagan [name] has four maternal uncles and two maternal aunts. All are living. One uncle Jeanene Erb) had colon cancer in his 40s or 1s. The rest of Meagan [name]' aunts and uncles are not known to have cancers. Meagan [name]' maternal grandmother Junie Panning) was diagnosed with colon cancer after age 57 and died in her 32s or 83s. Meagan [name]' maternal grandfather died from a stroke or aneurysm in his 25s and had no cancer history. There are multiple extended maternal relatives who have had cancers. *See end of note for list.  Meagan [name]' father is 37 and was diagnosed with leukemia at age 79. His specific type of leukemia is thought to be AML. Ms. Kushner' father is an only child. Ms. Schone' paternal grandmother is 55 without cancers. Her paternal grandfather died when her father was young due to complications related to alcohol abuse. Ms. Arriaga reports various cancers in multiple paternal extended relatives. *See end of note for list  Ms. Vernet is unaware of previous family history of genetic  testing for hereditary cancer risks. Patient's maternal and paternal ancestors are of Caucasian descent. There is no reported Ashkenazi Jewish ancestry. There is no known consanguinity.  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  [facility]'s Common Hereditary Cancers Panel includes analysis of the following 46 genes: APC, ATM, AXIN2, BARD1, BMPR1A, BRCA1, BRCA2, BRIP1, CDH1, CDKN2A, CHEK2, CTNNA1, DICER1, EPCAM, GREM1, HOXB13, KIT, MEN1, MLH1, MSH2, MSH3, MSH6, MUTYH, NBN, NF1, NTHL1, PALB2, PDGFRA, PMS2, POLD1, POLE, PTEN, RAD50, RAD51C, RAD51D, SDHA, SDHB, SDHC, SDHD, SMAD4, SMARCA4, STK11, TP53, TSC1, TSC2, and VHL. No additional pathogenic mutations or variants of uncertain significance were noted.  The test report will be scanned into EPIC and will be located under the Molecular Pathology section of the Results Review tab.A portion of the result report is included below for reference.    CLINICAL CONDITION Lynch syndrome is characterized by an increased risk of colorectal cancer as well as cancers of the endometrium, ovary, prostate, stomach, small intestine, hepatobiliary tract, urinary tract, pancreas and brain (PMID: 3748270, 78675449, 20100712). Lynch syndrome tumors typically demonstrate microsatellite instability (MSI) as well as loss of expression of the mismatch repair proteins on immunohistochemical (IHC) staining. This condition is caused by pathogenic variants in one of the mismatch repair genes-MLH1, MSH2, MSH6, PMS2-as well as deletions in the 3' end of the EPCAM gene.  RISKS   MANAGEMENT: Surveillance guidelines, as published by the [employer]  (NCCN), suggest the following for individuals with Lynch syndrome (*NCCN. Genetic/Familial High-Risk Assessment: Colorectal. Version 2.2016):  Colon cancer:              - Colonoscopy every 1-2 years starting at 76-76 years of age, or  2-5 years prior to the earliest colon cancer if diagnosed before age 52. Gynecological cancer:              - Prophylactic hysterectomy and bilateral salpingo-oophorectomy (BSO) may be considered by women after childbearing is complete.             - Education regarding prompt evaluation for dysfunctional uterine bleeding  There is no clear evidence to support routine endometrial cancer screening, nor do the data support ovarian cancer screening; however annual office endometrial sampling is an option. Serum CA-125 for ovarian cancer and transvaginal ultrasound for ovarian and endometrial cancer may be considered at the clinician's discretion. These modalities have not been shown to be sufficiently sensitive or specific to support a positive recommendation.  Other extra-colonic cancers             - While there is no clear evidence to support screening for gastric, duodenal, and small bowel cancer, select individuals, families, or those of Asian descent may     consider esophagogastroduodenoscopy (EGD) with extended duodenoscopy every 3-5 years beginning at age 89-8 years of age.             - Consider testing for and treating H. pylori.             - Consider annual urinalysis beginning at 81-41 years of age to screen for urothelial cancer.             - Consider annual physical/neurologic examination starting at 27-53 years of age.  NCCN states that no screening recommendation can be made for pancreatic cancer  at this time (*NCCN. Genetic/Familial High-Risk Assessment: Colorectal. Version 2.2016). In contrast, the [employer] of Gastroenterology Clinical Guidelines recommend pancreatic cancer screening for individuals with Lynch syndrome with a first- or second-degree relative affected with pancreatic cancer (PMID: 20355974, 16384536). Ideally, screening should be performed in experienced centers utilizing a multidisciplinary approach under research conditions. Recommended screening  includes annual endoscopic ultrasound and/or MRI of the pancreas starting at age 33 or 67 years younger than the earliest age of pancreatic cancer diagnosis in the family (PMID: 46803212).  INHERITANCE AND RECOMMENDATIONS FOR FAMILY MEMBERS Lynch syndrome has autosomal dominant inheritance.    Since we now know the mutation in Meagan [name], we can test at-risk relatives to determine whether or not they have inherited the mutation and are at increased risk for cancer.  It is important that all of Ms. Bau' relatives (both men and women) know of the presence of this gene mutation. Genetic testing of additional family members can sort out who in the family is at risk and who is not. We will be happy to meet with any of the family members or refer them to a genetic counselor in their local area. To locate genetic counselors in other cities, individuals can visit the website of the [employer] (ArtistMovie.[email or web address]) and Secretary/administrator for a Social worker by zip code.   Ms. Deam' daughters each have a 50% chance to have inherited this mutation. However, they are relatively young and this will not be of any consequence to them for several years. We do not test children because there is no risk to them until they are adults. We recommend they have genetic counseling and testing between the ages of 48-25.    Ms. Gainey' brother has a 50% chance to have this mutation. We recommend he have genetic testing for this same mutation, as identifying the presence of this mutation would allow him to also take advantage of risk-reducing measures.  We suspect that Meagan [name] inherited this mutation from her mother. However, we cannot know this with certainty without further testing. It is recommended that Meagan [name]' mother undergo genetic testing for this mutation to inform her own cancer risks and also provide risk information to the family. If Ms. Bergerson' mother tests negative for this mutation or  does not undergo genetic testing, it is recommended that Ms. Bujak' father be tested. Until it is determined which side of the family carries this mutation, Ms. Stille is advised to inform both maternal and paternal relatives (aunts, uncles, cousins) of their risk to carry the MSH6 mutation identified in her.  Additionally, individuals with a pathogenic variant in one of the MMR genes (MLH1, MSH2, MSH6, PMS2) are carriers of constitutional mismatch repair deficiency (CMMR-D). Children who inherit two mutations in the sameLynch gene, one mutation from each parent, are at-risk of a rare recessive condition called constitutional mismatch repair deficiency (CMMR-D) syndrome. CMMR-D is a childhood-onset cancer predisposition syndrome that can present with hematological malignancies, cancers of the brain and central nervous system, Lynch syndrome-associated cancers (colon, uterine, small bowel, urinary tract), embryonic tumors, and sarcomas. Some affected individuals may also display some features of neurofibromatosis type 1-most often cafe-au-lait macules (PMID: 24825003, 70488891). For there to be a risk of CMMR-D in offspring, an individual and their partner would each have to have a single pathogenic variant in the same MMR gene; in such a case, the risk of having an affected child is 25%. Individuals who have this mutation may wish to have their  partner tested if they are planning on having children.  PLAN:  1)  Ms. Esquilin was advised to discuss the risks and management recommendations associated with Lynch syndrome with Dr. Benay Spice. The relevance of these recommendations must be considered within the context of her diagnosis and current prognosis. Ms. Collard was also advised to consult Dr. Benay Spice regarding how her germline genetic testing results may or may not inform her therapy options. Though the plan is for Ms. Strang to return to Dr. Benay Spice for ongoing management of her colon cancer  and Lynch syndrome, she was encouraged to contact me if I can assist her in any way in the future. 2) Ms. Cupit was informed of [facility]'s Family Variant Testing Program which will test her family members for free if testing is performed within 90 days of her results (results dated for 05/03/2017). Ms. Prisco will share my phone number with her mother and brother and encourage them to contact me to schedule a genetic counseling appointment and be tested. Ms. Roszak gave verbal permission to discuss her genetic testing results and genetic counseling records with her mother and brother. Her mother is Evonnie Dawes DOB 01/01/1964. Her brother is Sherrell Puller DOB 04/06/1986. 3) Though genetic testing is not recommended for Ms. Poplin' daughters until they are 49, she was advised to inform her daughters' health care team of her genetic testing results and include a copy in her daughters' medical records so that it will be available when they are of appropriate age to test.  4) Ms. Mehan plans to return to our clinic on Monday, September 10 at noon to discuss results, plan, and obtain written documentation of her genetic testing results. At that time, Ms. Nisley will also be given information about national Lynch syndrome patient resource organizations called Hereditary Colon Cancer Takes Guts and Alive and [place].  Our contact number was provided. Ms. Speciale's questions were answered to her satisfaction, and she knows she is welcome to call us at anytime with additional questions or concerns.   Mal Misty, MS, [facility]  Certified Genetic Counselor [email or web address]    *Additional family history: Paternal Grandmother's Family (Kiger/Goins) Armed forces logistics/support/administrative officer (great-grandfather) colon cancer Merchant navy officer (great-grandmother) ovarian cancer Payton Emerald (great-great uncle) throat and lung cancer Ruby Contractor (great-great aunt) ovarian/colon cancer Tory Emerald - of the Kiger family (3rd  cousin) uterine cancer LG Richardson Landry - of the Kiger family (3rd cousin) lymphoma/leukemia Otilio Connors (3rd cousin) Hogkins lymphoma Karin Lieu - of the Kiger family (3rd cousin) throat and lung cancer Pearla Dubonnet (3rd cousin) AML. John's daughter, Benjamine Mola, AML. Gerda Diss (3rd cousin) melanoma Dario Guardian (unknown relation) "chemical induced carcinoma of the larynx and numerous squamous cell skin cancers  Maternal Grandmother Family ([place]) Junie Panning (grandmother) colon cancer Lendon Colonel (cousin) prostate cancer Illene Regulus (type of uncle) leukemia Tillman Abide (type of uncle) NOS? Benjaman Pott (type of aunt) lung cancer Jeanene Erb (uncle) colon cancer Celene Skeen (type of aunt) lung cancer Lisette Abu (type of uncle) lung cancer Bennie Hind (type of uncle) bone cancer   Drue Flirt (great aunt, unknown side) numerous basal cell carcinomas and one squamous cell carcinomas JC Chappel (3rd cousin, unknown side) liver cancer

## 2017-05-07 NOTE — Telephone Encounter (Signed)
Reviewed results of germline genetic testing. Ms. 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 Discussed that MSH6 mutations are associated with hereditary cancer syndrome called Lynch syndrome. Reviewed risks and management recommendations associated with Lynch syndrome.  Recommended that Ms. Spilman' mother and brother undergo genetic counseling and testing as each has a 50% chance to also carry this mutation. If Ms. Laramie' mother is negative for this MSH6 mutation, genetic testing for Ms. Esch' father is recommended. Ms. Paske was informed that if her relatives undergo genetic testing through [facility] within 90 days of 05/03/2017, their testing can be performed for free. If they do not undergo genetic testing during this timeframe, there are still many testing options that are often covered by insurance or can be performed through financial assistance and self-pay. Discussed that each of Ms. Bruinsma' daughters will need to undergo genetic testing, though this is not recommended until they are 63.  No other pathogenic mutations or variants of uncertain significance were identified through Ms. Seward' testing.  Testing was performed through [facility]'s 46-gene Common Hereditary Cancers Panel. [facility] Common Hereditary Cancers Panel includes analysis of the following 46 genes: APC, ATM, AXIN2, BARD1, BMPR1A, BRCA1, BRCA2, BRIP1, CDH1, CDKN2A, CHEK2, CTNNA1, DICER1, EPCAM, GREM1, HOXB13, KIT, MEN1, MLH1, MSH2, MSH3, MSH6, MUTYH, NBN, NF1, NTHL1, PALB2, PDGFRA, PMS2, POLD1, POLE, PTEN, RAD50, RAD51C, RAD51D, SDHA, SDHB, SDHC, SDHD, SMAD4, SMARCA4, STK11, TP53, TSC1, TSC2, and VHL.  Ms. Carawan plans to return for in-person discussion of results on Monday, September 10 around noon. For more detailed discussion, please see genetic counseling documentation from 05/07/2017. Result report dated 05/03/2017.

## 2017-05-08 DIAGNOSIS — C189 Malignant neoplasm of colon, unspecified: Secondary | ICD-10-CM

## 2017-05-09 ENCOUNTER — Other Ambulatory Visit: Payer: Self-pay | Admitting: Oncology

## 2017-05-12 ENCOUNTER — Telehealth: Payer: Self-pay

## 2017-05-12 NOTE — Telephone Encounter (Signed)
I called Meagan [name] to see if she has any questions  Prior to her starting immunotherapy on 05/13/17. Patient did not have questions. I encouraged her to drink 64 oz. of water today and to eat a good meal prior to her infusion tomorrow. She intends to attend the "Look Good, Feel Better" class at [facility] today.

## 2017-05-13 ENCOUNTER — Encounter: Payer: Self-pay | Admitting: General Practice

## 2017-05-13 ENCOUNTER — Other Ambulatory Visit (HOSPITAL_BASED_OUTPATIENT_CLINIC_OR_DEPARTMENT_OTHER): Payer: Self-pay

## 2017-05-13 ENCOUNTER — Ambulatory Visit (HOSPITAL_BASED_OUTPATIENT_CLINIC_OR_DEPARTMENT_OTHER): Payer: Self-pay

## 2017-05-13 ENCOUNTER — Ambulatory Visit (HOSPITAL_BASED_OUTPATIENT_CLINIC_OR_DEPARTMENT_OTHER): Payer: Self-pay | Admitting: Nurse Practitioner

## 2017-05-13 ENCOUNTER — Telehealth: Payer: Self-pay | Admitting: Nurse Practitioner

## 2017-05-13 VITALS — BP 107/71 | HR 99 | Temp 99.0°F | Resp 19 | Ht 62.0 in | Wt 106.9 lb

## 2017-05-13 DIAGNOSIS — C180 Malignant neoplasm of cecum: Secondary | ICD-10-CM

## 2017-05-13 DIAGNOSIS — Z5112 Encounter for antineoplastic immunotherapy: Secondary | ICD-10-CM

## 2017-05-13 DIAGNOSIS — C189 Malignant neoplasm of colon, unspecified: Secondary | ICD-10-CM

## 2017-05-13 DIAGNOSIS — D509 Iron deficiency anemia, unspecified: Secondary | ICD-10-CM

## 2017-05-13 DIAGNOSIS — E876 Hypokalemia: Secondary | ICD-10-CM

## 2017-05-13 DIAGNOSIS — C778 Secondary and unspecified malignant neoplasm of lymph nodes of multiple regions: Secondary | ICD-10-CM

## 2017-05-13 LAB — CBC WITH DIFFERENTIAL/PLATELET
BASO%: 0.2 % (ref 0.0–2.0)
Basophils Absolute: 0 10*3/uL (ref 0.0–0.1)
EOS ABS: 0.1 10*3/uL (ref 0.0–0.5)
EOS%: 2.1 % (ref 0.0–7.0)
HCT: 37.4 % (ref 34.8–46.6)
HEMOGLOBIN: 12 g/dL (ref 11.6–15.9)
LYMPH%: 28.4 % (ref 14.0–49.7)
MCH: 29.1 pg (ref 25.1–34.0)
MCHC: 32.1 g/dL (ref 31.5–36.0)
MCV: 90.8 fL (ref 79.5–101.0)
MONO#: 0.3 10*3/uL (ref 0.1–0.9)
MONO%: 5.6 % (ref 0.0–14.0)
NEUT#: 3.4 10*3/uL (ref 1.5–6.5)
NEUT%: 63.7 % (ref 38.4–76.8)
Platelets: 212 10*3/uL (ref 145–400)
RBC: 4.12 10*6/uL (ref 3.70–5.45)
RDW: 15.9 % — AB (ref 11.2–14.5)
WBC: 5.3 10*3/uL (ref 3.9–10.3)
lymph#: 1.5 10*3/uL (ref 0.9–3.3)

## 2017-05-13 LAB — COMPREHENSIVE METABOLIC PANEL
ALBUMIN: 3.8 g/dL (ref 3.5–5.0)
ALT: 8 U/L (ref 0–55)
AST: 13 U/L (ref 5–34)
Alkaline Phosphatase: 55 U/L (ref 40–150)
Anion Gap: 9 mEq/L (ref 3–11)
BUN: 7.7 mg/dL (ref 7.0–26.0)
CHLORIDE: 106 meq/L (ref 98–109)
CO2: 23 meq/L (ref 22–29)
Calcium: 9.3 mg/dL (ref 8.4–10.4)
Creatinine: 0.7 mg/dL (ref 0.6–1.1)
EGFR: >90 mL/min/{1.73_m2} (ref >90)
GLUCOSE: 89 mg/dL (ref 70–140)
POTASSIUM: 3.4 meq/L — AB (ref 3.5–5.1)
SODIUM: 139 meq/L (ref 136–145)
Total Bilirubin: 0.41 mg/dL (ref 0.20–1.20)
Total Protein: 7.5 g/dL (ref 6.4–8.3)

## 2017-05-13 LAB — CEA (IN HOUSE-CHCC): CEA (CHCC-In House): 4.52 ng/mL (ref 0.00–5.00)

## 2017-05-13 MED ORDER — SODIUM CHLORIDE 0.9 % IV SOLN
200.0000 mg | Freq: Once | INTRAVENOUS | Status: AC
  Administered 2017-05-13: 200 mg via INTRAVENOUS
  Filled 2017-05-13: qty 8

## 2017-05-13 MED ORDER — SODIUM CHLORIDE 0.9 % IV SOLN
Freq: Once | INTRAVENOUS | Status: AC
  Administered 2017-05-13: 16:00:00 via INTRAVENOUS

## 2017-05-13 MED ORDER — PROCHLORPERAZINE MALEATE 10 MG PO TABS: 10.0000 mg | ORAL_TABLET | Freq: Four times a day (QID) | ORAL | 0 refills | Status: DC | PRN

## 2017-05-13 MED ORDER — FERROUS SULFATE 325 (65 FE) MG PO TABS: 325.0000 mg | ORAL_TABLET | Freq: Every day | ORAL | 0 refills | Status: DC

## 2017-05-13 NOTE — Patient Instructions (Signed)
[facility] Discharge Instructions for Patients Receiving Chemotherapy  Today you received the following chemotherapy agents Keytruda  To help prevent nausea and vomiting after your treatment, we encourage you to take your nausea medication as directed   If you develop nausea and vomiting that is not controlled by your nausea medication, call the clinic.   BELOW ARE SYMPTOMS THAT SHOULD BE REPORTED IMMEDIATELY:  *FEVER GREATER THAN 100.5 F  *CHILLS WITH OR WITHOUT FEVER  NAUSEA AND VOMITING THAT IS NOT CONTROLLED WITH YOUR NAUSEA MEDICATION  *UNUSUAL SHORTNESS OF BREATH  *UNUSUAL BRUISING OR BLEEDING  TENDERNESS IN MOUTH AND THROAT WITH OR WITHOUT PRESENCE OF ULCERS  *URINARY PROBLEMS  *BOWEL PROBLEMS  UNUSUAL RASH Items with * indicate a potential emergency and should be followed up as soon as possible.  Feel free to call the clinic you have any questions or concerns. The clinic phone number is [phone].  Please show the [facility] at check-in to the Emergency Department and triage nurse.   Pembrolizumab/ KEYTRUDA injection What is this medicine? PEMBROLIZUMAB (pem broe liz ue mab) is a monoclonal antibody. It is used to treat melanoma, head and neck cancer, Hodgkin lymphoma, non-small cell lung cancer, urothelial cancer, stomach cancer, and cancers that have a certain genetic condition. This medicine may be used for other purposes; ask your health care provider or pharmacist if you have questions. COMMON BRAND NAME(S): Keytruda What should I tell my health care provider before I take this medicine? They need to know if you have any of these conditions: -diabetes -immune system problems -inflammatory bowel disease -liver disease -lung or breathing disease -lupus -organ transplant -an unusual or allergic reaction to pembrolizumab, other medicines, foods, dyes, or preservatives -pregnant or trying to get pregnant -breast-feeding How should  I use this medicine? This medicine is for infusion into a vein. It is given by a health care professional in a hospital or clinic setting. A special MedGuide will be given to you before each treatment. Be sure to read this information carefully each time. Talk to your pediatrician regarding the use of this medicine in children. While this drug may be prescribed for selected conditions, precautions do apply. Overdosage: If you think you have taken too much of this medicine contact a poison control center or emergency room at once. NOTE: This medicine is only for you. Do not share this medicine with others. What if I miss a dose? It is important not to miss your dose. Call your doctor or health care professional if you are unable to keep an appointment. What may interact with this medicine? Interactions have not been studied. Give your health care provider a list of all the medicines, herbs, non-prescription drugs, or dietary supplements you use. Also tell them if you smoke, drink alcohol, or use illegal drugs. Some items may interact with your medicine. This list may not describe all possible interactions. Give your health care provider a list of all the medicines, herbs, non-prescription drugs, or dietary supplements you use. Also tell them if you smoke, drink alcohol, or use illegal drugs. Some items may interact with your medicine. What should I watch for while using this medicine? Your condition will be monitored carefully while you are receiving this medicine. You may need blood work done while you are taking this medicine. Do not become pregnant while taking this medicine or for 4 months after stopping it. Women should inform their doctor if they wish to become pregnant or think they  might be pregnant. There is a potential for serious side effects to an unborn child. Talk to your health care professional or pharmacist for more information. Do not breast-feed an infant while taking this medicine  or for 4 months after the last dose. What side effects may I notice from receiving this medicine? Side effects that you should report to your doctor or health care professional as soon as possible: -allergic reactions like skin rash, itching or hives, swelling of the face, lips, or tongue -bloody or black, tarry -breathing problems -changes in vision -chest pain -chills -constipation -cough -dizziness or feeling faint or lightheaded -fast or irregular heartbeat -fever -flushing -hair loss -low blood counts - this medicine may decrease the number of white blood cells, red blood cells and platelets. You may be at increased risk for infections and bleeding. -muscle pain -muscle weakness -persistent headache -signs and symptoms of high blood sugar such as dizziness; dry mouth; dry skin; fruity breath; nausea; stomach pain; increased hunger or thirst; increased urination -signs and symptoms of kidney injury like trouble passing urine or change in the amount of urine -signs and symptoms of liver injury like dark urine, light-colored stools, loss of appetite, nausea, right upper belly pain, yellowing of the eyes or skin -stomach pain -sweating -weight loss Side effects that usually do not require medical attention (report to your doctor or health care professional if they continue or are bothersome): -decreased appetite -diarrhea -tiredness This list may not describe all possible side effects. Call your doctor for medical advice about side effects. You may report side effects to FDA at 1-800-FDA-1088. Where should I keep my medicine? This drug is given in a hospital or clinic and will not be stored at home. NOTE: This sheet is a summary. It may not cover all possible information. If you have questions about this medicine, talk to your doctor, pharmacist, or health care provider.  2018 Elsevier/Gold Standard (2016-05-28 12:29:36)

## 2017-05-13 NOTE — Progress Notes (Signed)
[facility] Spiritual Care Note  Due to an unexpected conflict, I sent a handwritten note of encouragement for Keylie to receive in infusion today in lieu of a personal visit.   [place], [place], [facility] Pager [phone] Voicemail [phone]

## 2017-05-13 NOTE — Patient Instructions (Signed)

## 2017-05-13 NOTE — Telephone Encounter (Signed)
Gave patient AVS and calendar of upcoming October appointments °

## 2017-05-13 NOTE — Progress Notes (Signed)
Met with patient and husband during infusion in treatment room. Patient provided with contact information for Financial Counselor. Patient will call to set up appointment. Patient provided with support and encouragement.

## 2017-05-13 NOTE — Progress Notes (Signed)
  Meagan [name] OFFICE PROGRESS NOTE   Diagnosis:  Colon cancer  INTERVAL HISTORY:   Meagan [name] returns as scheduled. She feels well. She has a good appetite and is gaining weight. She has a good energy level. She denies pain. No nausea or vomiting. Bowels moving regularly.  Objective  Vital signs in last 24 hours:  Blood pressure 107/71, pulse 99, temperature 99 F (37.2 C), temperature source Oral, resp. rate 19, height '5\' 2"'$  (1.575 m), weight 106 lb 14.4 oz (48.5 kg), SpO2 100 %.    HEENT: No thrush or ulcers. Resp: Lungs clear bilaterally. Cardio: Regular rate and rhythm. GI: Abdomen soft and nontender. No hepatomegaly. Well-healed surgical scar. Vascular: No leg edema.    Lab Results:  Lab Results  Component Value Date   WBC 5.3 05/13/2017   HGB 12.0 05/13/2017   HCT 37.4 05/13/2017   MCV 90.8 05/13/2017   PLT 212 05/13/2017   NEUTROABS 3.4 05/13/2017    Imaging:  No results found.  Medications: I have reviewed the patient's current medications.  Assessment/Plan: 1. Stage IV Colon cancer, cecum, presenting with a palpable low abdominal/pelvic mass ? Colonoscopy 03/31/2017 confirmed a cecum mass, biopsy positive for poorly differentiated adenocarcinoma ? CT abdomen/pelvis 03/29/2017-large necrotic right iliac fossa mass, enlarged right common iliac nodes, loculated pelvic ascites, right lower quadrant mesenteric masses ? Right colectomy/right oophorectomy-salpingectomy 04/04/2017 ? Pathology from the right colon resection, grade 3 adenocarcinoma of the cecum,pT4b,pN2b ? Loss of MSH6 expression ? PET scan 04/29/2017-residual tumor at the left adnexa, and internal iliac lymph node, and aortocaval node  2. Lynch syndrome. She is positive for a pathogenic mutation in MSH6   3.   Anemia-likely iron deficiency anemia secondary to #1. Improved 05/13/2017.  4. G3 P3  5.  Appendectomy  5. Maternal aunt: With colon  cancer   Disposition: [name] appears stable. The plan is to proceed with cycle 1 Pembrolizumab today as scheduled. We again reviewed potential toxicities. She is agreeable to proceed.  We reviewed today's labs. The hemoglobin has corrected into normal range. She will continue oral iron for now. She has mild hypokalemia. She was provided with information on foods high in potassium.  She will return for a follow-up visit and cycle 2 Pembrolizumab on 05/30/2017. She will contact the office in the interim with any problems.  Plan reviewed with Dr. Benay Spice.  Ned Card ANP/GNP-BC   05/13/2017  2:12 PM

## 2017-05-23 ENCOUNTER — Telehealth: Payer: Self-pay

## 2017-05-23 NOTE — Telephone Encounter (Signed)
Called patient to see how she is feeling and how she tolerated her first infusion. Patient stated that she  Is doing well with no issues or side effects. Patient stated "I'm ready to get this over with." Patient reported that Meagan [name] has not received her Medicaid application that was submitted during her hospitalization. Keniesha spoke with [facility] social work to assist with this barrier. No additional barriers to care identified at this time. I will touch base with patient during her infusion on  9.28.18.

## 2017-05-29 ENCOUNTER — Encounter: Payer: Self-pay | Admitting: Oncology

## 2017-05-29 NOTE — Progress Notes (Signed)
Called pt to introduce myself as her Arboriculturist and to discuss the [employer].  I went over what it covers so pt would like to apply.  Since she's not working right now she will provide her husband's check stubs on 05/30/17.  If approved I will give her an expense sheet.  I will also give her my card to contact me with any questions or concerns she may have in the future.

## 2017-05-30 ENCOUNTER — Other Ambulatory Visit (HOSPITAL_BASED_OUTPATIENT_CLINIC_OR_DEPARTMENT_OTHER): Payer: Self-pay

## 2017-05-30 ENCOUNTER — Ambulatory Visit (HOSPITAL_BASED_OUTPATIENT_CLINIC_OR_DEPARTMENT_OTHER): Payer: Self-pay | Admitting: Oncology

## 2017-05-30 ENCOUNTER — Ambulatory Visit: Payer: Self-pay | Admitting: Nutrition

## 2017-05-30 ENCOUNTER — Encounter: Payer: Self-pay | Admitting: Oncology

## 2017-05-30 ENCOUNTER — Ambulatory Visit (HOSPITAL_BASED_OUTPATIENT_CLINIC_OR_DEPARTMENT_OTHER): Payer: Self-pay

## 2017-05-30 ENCOUNTER — Telehealth: Payer: Self-pay | Admitting: Oncology

## 2017-05-30 VITALS — BP 114/72 | HR 78 | Temp 98.3°F | Resp 18 | Ht 62.0 in | Wt 110.1 lb

## 2017-05-30 DIAGNOSIS — C189 Malignant neoplasm of colon, unspecified: Secondary | ICD-10-CM

## 2017-05-30 DIAGNOSIS — C180 Malignant neoplasm of cecum: Secondary | ICD-10-CM

## 2017-05-30 DIAGNOSIS — Z23 Encounter for immunization: Secondary | ICD-10-CM

## 2017-05-30 DIAGNOSIS — Z5112 Encounter for antineoplastic immunotherapy: Secondary | ICD-10-CM

## 2017-05-30 DIAGNOSIS — Z79899 Other long term (current) drug therapy: Secondary | ICD-10-CM

## 2017-05-30 LAB — CBC WITH DIFFERENTIAL/PLATELET
BASO%: 0.2 % (ref 0.0–2.0)
Basophils Absolute: 0 10*3/uL (ref 0.0–0.1)
EOS%: 2.3 % (ref 0.0–7.0)
Eosinophils Absolute: 0.1 10*3/uL (ref 0.0–0.5)
HEMATOCRIT: 37.7 % (ref 34.8–46.6)
HGB: 12.2 g/dL (ref 11.6–15.9)
LYMPH%: 28.2 % (ref 14.0–49.7)
MCH: 29 pg (ref 25.1–34.0)
MCHC: 32.4 g/dL (ref 31.5–36.0)
MCV: 89.5 fL (ref 79.5–101.0)
MONO#: 0.4 10*3/uL (ref 0.1–0.9)
MONO%: 8.5 % (ref 0.0–14.0)
NEUT#: 2.7 10*3/uL (ref 1.5–6.5)
NEUT%: 60.8 % (ref 38.4–76.8)
Platelets: 248 10*3/uL (ref 145–400)
RBC: 4.21 10*6/uL (ref 3.70–5.45)
RDW: 15 % — ABNORMAL HIGH (ref 11.2–14.5)
WBC: 4.4 10*3/uL (ref 3.9–10.3)
lymph#: 1.2 10*3/uL (ref 0.9–3.3)

## 2017-05-30 LAB — COMPREHENSIVE METABOLIC PANEL
ALT: 7 U/L (ref 0–55)
AST: 13 U/L (ref 5–34)
Albumin: 3.8 g/dL (ref 3.5–5.0)
Alkaline Phosphatase: 59 U/L (ref 40–150)
Anion Gap: 9 mEq/L (ref 3–11)
BUN: 10.5 mg/dL (ref 7.0–26.0)
CALCIUM: 9.2 mg/dL (ref 8.4–10.4)
CHLORIDE: 106 meq/L (ref 98–109)
CO2: 24 mEq/L (ref 22–29)
CREATININE: 0.7 mg/dL (ref 0.6–1.1)
EGFR: >90 mL/min/{1.73_m2} (ref >90)
GLUCOSE: 76 mg/dL (ref 70–140)
POTASSIUM: 3.7 meq/L (ref 3.5–5.1)
SODIUM: 139 meq/L (ref 136–145)
Total Bilirubin: 0.32 mg/dL (ref 0.20–1.20)
Total Protein: 7.6 g/dL (ref 6.4–8.3)

## 2017-05-30 LAB — TSH: TSH: 0.6 m[IU]/L (ref 0.308–3.960)

## 2017-05-30 MED ORDER — SODIUM CHLORIDE 0.9 % IV SOLN
Freq: Once | INTRAVENOUS | Status: AC
  Administered 2017-05-30: 11:00:00 via INTRAVENOUS

## 2017-05-30 MED ORDER — SODIUM CHLORIDE 0.9 % IV SOLN
200.0000 mg | Freq: Once | INTRAVENOUS | Status: AC
  Administered 2017-05-30: 200 mg via INTRAVENOUS
  Filled 2017-05-30: qty 8

## 2017-05-30 MED ORDER — INFLUENZA VAC SPLIT QUAD 0.5 ML IM SUSY
0.5000 mL | PREFILLED_SYRINGE | Freq: Once | INTRAMUSCULAR | Status: AC
  Administered 2017-05-30: 0.5 mL via INTRAMUSCULAR
  Filled 2017-05-30: qty 0.5

## 2017-05-30 NOTE — Progress Notes (Signed)
31 year old female diagnosed with colon cancer.  She is a patient of Dr. Benay Spice.  Past medical history includes ovarian cyst.  Medications include ferrous sulfate, Ativan, and Compazine.  Labs include potassium 3.4 on September 11.  Height: 5 feet 2 inches. Weight: 110.1 pounds September 28. Usual body weight: 92 pounds August 14. BMI: 20.14.  Patient reports she is now eating well. Reports she enjoys burgers and pizza.  Patient enjoys high-protein foods. Patient reports she is not having any nutrition impact symptoms. Patient has gained 18 pounds over the past 5 weeks.  No nutrition diagnosis at this time.  Provided contact information for patient who agrees to contact me for questions and concerns.  **Disclaimer: This note was dictated with voice recognition software. Similar sounding words can inadvertently be transcribed and this note may contain transcription errors which may not have been corrected upon publication of note.**

## 2017-05-30 NOTE — Progress Notes (Signed)
  [place] OFFICE PROGRESS NOTE   Diagnosis: Colon cancer  INTERVAL HISTORY:   [name] returns as scheduled. She completed a first cycle of pembrolizumab on 05/13/2017. She tolerated the treatment well. No rash or nausea. No complaint. Good appetite. She is gaining weight.  Objective:  Vital signs in last 24 hours:  Blood pressure 114/72, pulse 78, temperature 98.3 F (36.8 C), temperature source Oral, resp. rate 18, height '5\' 2"'$  (1.575 m), weight 110 lb 1.6 oz (49.9 kg), SpO2 100 %.    Resp: Lungs clear bilaterally Cardio: Regular rate and rhythm GI: No hepatosplenomegaly, no mass, nontender, healed midline incision Vascular: No leg edema  Skin: Multiple tattoos, no rash     Lab Results:  Lab Results  Component Value Date   WBC 4.4 05/30/2017   HGB 12.2 05/30/2017   HCT 37.7 05/30/2017   MCV 89.5 05/30/2017   PLT 248 05/30/2017   NEUTROABS 2.7 05/30/2017    CMP     Component Value Date/Time   NA 139 05/13/2017 1333   K 3.4 (L) 05/13/2017 1333   CL 108 04/08/2017 0400   CO2 23 05/13/2017 1333   GLUCOSE 89 05/13/2017 1333   BUN 7.7 05/13/2017 1333   CREATININE 0.7 05/13/2017 1333   CALCIUM 9.3 05/13/2017 1333   PROT 7.5 05/13/2017 1333   ALBUMIN 3.8 05/13/2017 1333   AST 13 05/13/2017 1333   ALT 8 05/13/2017 1333   ALKPHOS 55 05/13/2017 1333   BILITOT 0.41 05/13/2017 1333   GFRNONAA >60 04/08/2017 0400   GFRAA >60 04/08/2017 0400    Lab Results  Component Value Date   CEA1 4.52 05/13/2017     Medications: I have reviewed the patient's current medications.  Assessment/Plan: 1. Stage IV Colon cancer, cecum, presenting with a palpable low abdominal/pelvic mass ? Colonoscopy 03/31/2017 confirmed a cecum mass, biopsy positive for poorly differentiated adenocarcinoma ? CT abdomen/pelvis 03/29/2017-large necrotic right iliac fossa mass, enlarged right common iliac nodes, loculated pelvic ascites, right lower quadrant mesenteric  masses ? Right colectomy/right oophorectomy-salpingectomy 04/04/2017 ? Pathology from the right colon resection, grade 3 adenocarcinoma of the cecum,pT4b,pN2b ? Loss of MSH6 expression ? PET scan 04/29/2017-residual tumor at the left adnexa, and internal iliac lymph node, and aortocaval node ? Cycle 1 Pembrolizumab 05/13/2017  2. Lynch syndrome. She is positive for a pathogenic mutation in MSH6   3.   Anemia-likely iron deficiency anemia secondary to #1. Improved 05/13/2017.  4. G3 P3  5.  Appendectomy  5. Maternal aunt: With colon cancer    Disposition:  [name] appears well. She tolerated the first treatment with pembrolizumab well. She will complete another treatment today. She will return for an office visit and pembrolizumab in 3 weeks.  15 minutes were spent with the patient today. The majority of the time was used for counseling and coordination of care.  Donneta Romberg, MD  05/30/2017  10:31 AM

## 2017-05-30 NOTE — Telephone Encounter (Signed)
Gave patients husband AVS and calendar of upcoming October appointments.  °

## 2017-05-30 NOTE — Progress Notes (Signed)
Pt is approved for the $400 [employer] grant.  °

## 2017-05-31 LAB — T4: Thyroxine (T4): 9 ug/dL (ref 4.5–12.0)

## 2017-05-31 LAB — T3 UPTAKE
FREE THYROXINE INDEX: 2.5 (ref 1.2–4.9)
T3 UPTAKE RATIO: 28 % (ref 24–39)

## 2017-05-31 LAB — T4, FREE: FREE T4: 1.28 ng/dL (ref 0.82–1.77)

## 2017-06-19 ENCOUNTER — Other Ambulatory Visit: Payer: Self-pay | Admitting: *Deleted

## 2017-06-19 ENCOUNTER — Telehealth: Payer: Self-pay | Admitting: *Deleted

## 2017-06-19 DIAGNOSIS — C189 Malignant neoplasm of colon, unspecified: Secondary | ICD-10-CM

## 2017-06-19 NOTE — Telephone Encounter (Signed)
Per Dr. Benay Spice: Pt needs CMET only prior to Pembrolizumab treatment on 06/20/17. Order entered.

## 2017-06-20 ENCOUNTER — Ambulatory Visit (HOSPITAL_BASED_OUTPATIENT_CLINIC_OR_DEPARTMENT_OTHER): Payer: Self-pay

## 2017-06-20 ENCOUNTER — Ambulatory Visit (HOSPITAL_BASED_OUTPATIENT_CLINIC_OR_DEPARTMENT_OTHER): Payer: Self-pay | Admitting: Nurse Practitioner

## 2017-06-20 ENCOUNTER — Telehealth: Payer: Self-pay | Admitting: Oncology

## 2017-06-20 ENCOUNTER — Other Ambulatory Visit (HOSPITAL_BASED_OUTPATIENT_CLINIC_OR_DEPARTMENT_OTHER): Payer: Self-pay

## 2017-06-20 ENCOUNTER — Other Ambulatory Visit: Payer: Self-pay | Admitting: Oncology

## 2017-06-20 VITALS — BP 114/70 | HR 97 | Temp 98.3°F | Resp 20 | Ht 62.0 in | Wt 112.0 lb

## 2017-06-20 DIAGNOSIS — C189 Malignant neoplasm of colon, unspecified: Secondary | ICD-10-CM

## 2017-06-20 DIAGNOSIS — C180 Malignant neoplasm of cecum: Secondary | ICD-10-CM

## 2017-06-20 DIAGNOSIS — C778 Secondary and unspecified malignant neoplasm of lymph nodes of multiple regions: Secondary | ICD-10-CM

## 2017-06-20 DIAGNOSIS — D500 Iron deficiency anemia secondary to blood loss (chronic): Secondary | ICD-10-CM

## 2017-06-20 DIAGNOSIS — Z5112 Encounter for antineoplastic immunotherapy: Secondary | ICD-10-CM

## 2017-06-20 LAB — COMPREHENSIVE METABOLIC PANEL
ALT: 9 U/L (ref 0–55)
ANION GAP: 9 meq/L (ref 3–11)
AST: 13 U/L (ref 5–34)
Albumin: 3.8 g/dL (ref 3.5–5.0)
Alkaline Phosphatase: 53 U/L (ref 40–150)
BUN: 11.8 mg/dL (ref 7.0–26.0)
CALCIUM: 9 mg/dL (ref 8.4–10.4)
CHLORIDE: 107 meq/L (ref 98–109)
CO2: 24 mEq/L (ref 22–29)
Creatinine: 0.7 mg/dL (ref 0.6–1.1)
Glucose: 96 mg/dl (ref 70–140)
POTASSIUM: 3.5 meq/L (ref 3.5–5.1)
Sodium: 140 mEq/L (ref 136–145)
Total Bilirubin: 0.35 mg/dL (ref 0.20–1.20)
Total Protein: 7.3 g/dL (ref 6.4–8.3)

## 2017-06-20 MED ORDER — SODIUM CHLORIDE 0.9 % IV SOLN
Freq: Once | INTRAVENOUS | Status: AC
  Administered 2017-06-20: 11:00:00 via INTRAVENOUS

## 2017-06-20 MED ORDER — SODIUM CHLORIDE 0.9 % IV SOLN
200.0000 mg | Freq: Once | INTRAVENOUS | Status: AC
  Administered 2017-06-20: 200 mg via INTRAVENOUS
  Filled 2017-06-20: qty 8

## 2017-06-20 NOTE — Patient Instructions (Signed)
[facility] Discharge Instructions for Patients Receiving Chemotherapy  Today you received the following chemotherapy agents :  Keytruda.  To help prevent nausea and vomiting after your treatment, we encourage you to take your nausea medication as prescribed.   If you develop nausea and vomiting that is not controlled by your nausea medication, call the clinic.   BELOW ARE SYMPTOMS THAT SHOULD BE REPORTED IMMEDIATELY:  *FEVER GREATER THAN 100.5 F  *CHILLS WITH OR WITHOUT FEVER  NAUSEA AND VOMITING THAT IS NOT CONTROLLED WITH YOUR NAUSEA MEDICATION  *UNUSUAL SHORTNESS OF BREATH  *UNUSUAL BRUISING OR BLEEDING  TENDERNESS IN MOUTH AND THROAT WITH OR WITHOUT PRESENCE OF ULCERS  *URINARY PROBLEMS  *BOWEL PROBLEMS  UNUSUAL RASH Items with * indicate a potential emergency and should be followed up as soon as possible.  Feel free to call the clinic should you have any questions or concerns. The clinic phone number is [phone].  Please show the CHEMO ALERT CARD at check-in to the Emergency Department and triage nurse.  

## 2017-06-20 NOTE — Telephone Encounter (Signed)
Gave avs and calendar for December patient was unsure of time

## 2017-06-20 NOTE — Progress Notes (Signed)
Ok to proceed with treatment today without CBC per Ned Card, NP.

## 2017-06-20 NOTE — Progress Notes (Signed)
  [place] OFFICE PROGRESS NOTE   Diagnosis:  Colon cancer  INTERVAL HISTORY:   Meagan [name] returns as scheduled. She completed cycle 2 Pembrolizumab 05/30/2017. She denies nausea/vomiting. No mouth sores. No diarrhea. No rash. She denies pain. She has a good appetite. She is gaining weight.  Objective:  Vital signs in last 24 hours:  Blood pressure 114/70, pulse 97, temperature 98.3 F (36.8 C), temperature source Oral, resp. rate 20, height '5\' 2"'$  (1.575 m), weight 112 lb (50.8 kg), SpO2 100 %.    HEENT: no thrush or ulcers. Resp: lungs clear bilaterally. Cardio: regular rate and rhythm. GI: abdomen soft and nontender. No hepatomegaly. Vascular: no leg edema. Skin: no rash.    Lab Results:  Lab Results  Component Value Date   WBC 4.4 05/30/2017   HGB 12.2 05/30/2017   HCT 37.7 05/30/2017   MCV 89.5 05/30/2017   PLT 248 05/30/2017   NEUTROABS 2.7 05/30/2017    Imaging:  No results found.  Medications: I have reviewed the patient's current medications.  Assessment/Plan: 1. Stage IV Colon cancer, cecum, presenting with a palpable low abdominal/pelvic mass ? Colonoscopy 03/31/2017 confirmed a cecum mass, biopsy positive for poorly differentiated adenocarcinoma ? CT abdomen/pelvis 03/29/2017-large necrotic right iliac fossa mass, enlarged right common iliac nodes, loculated pelvic ascites, right lower quadrant mesenteric masses ? Right colectomy/right oophorectomy-salpingectomy 04/04/2017 ? Pathology from the right colon resection, grade 3 adenocarcinoma of the cecum,pT4b,pN2b ? Loss of MSH6 expression ? PET scan 04/29/2017-residual tumor at the left adnexa, and internal iliac lymph node, and aortocaval node ? Cycle 1 Pembrolizumab 05/13/2017 ? Cycle 2 Pembrolizumab 05/30/2017 ? Cycle 3 Pembrolizumab 06/20/2017  2. 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 Anemia-likely iron deficiency anemia secondary to #1.  Improved 05/13/2017.  4.G3 P3  5. Appendectomy  5. Maternal aunt with colon cancer    Disposition: Meagan [name] appears stable. She has completed 2 cycles of Pembrolizumab. Plan to proceed with cycle 3 today as scheduled. She will return for a follow-up visit and the next cycle in 3 weeks. She will contact the office in the interim with any problems.    Ned Card ANP/GNP-BC   06/20/2017  8:58 AM

## 2017-07-06 ENCOUNTER — Other Ambulatory Visit: Payer: Self-pay | Admitting: Oncology

## 2017-07-11 ENCOUNTER — Telehealth: Payer: Self-pay | Admitting: Oncology

## 2017-07-11 ENCOUNTER — Ambulatory Visit (HOSPITAL_BASED_OUTPATIENT_CLINIC_OR_DEPARTMENT_OTHER): Payer: Self-pay

## 2017-07-11 ENCOUNTER — Other Ambulatory Visit (HOSPITAL_BASED_OUTPATIENT_CLINIC_OR_DEPARTMENT_OTHER): Payer: Self-pay

## 2017-07-11 ENCOUNTER — Ambulatory Visit (HOSPITAL_BASED_OUTPATIENT_CLINIC_OR_DEPARTMENT_OTHER): Payer: Self-pay | Admitting: Oncology

## 2017-07-11 ENCOUNTER — Telehealth: Payer: Self-pay | Admitting: *Deleted

## 2017-07-11 VITALS — BP 116/75 | HR 106 | Temp 98.3°F | Resp 18 | Ht 62.0 in | Wt 110.7 lb

## 2017-07-11 VITALS — HR 97

## 2017-07-11 DIAGNOSIS — C778 Secondary and unspecified malignant neoplasm of lymph nodes of multiple regions: Secondary | ICD-10-CM

## 2017-07-11 DIAGNOSIS — C180 Malignant neoplasm of cecum: Secondary | ICD-10-CM

## 2017-07-11 DIAGNOSIS — C189 Malignant neoplasm of colon, unspecified: Secondary | ICD-10-CM

## 2017-07-11 DIAGNOSIS — Z5112 Encounter for antineoplastic immunotherapy: Secondary | ICD-10-CM

## 2017-07-11 LAB — CBC WITH DIFFERENTIAL/PLATELET
BASO%: 0.5 % (ref 0.0–2.0)
Basophils Absolute: 0 10*3/uL (ref 0.0–0.1)
EOS ABS: 0.1 10*3/uL (ref 0.0–0.5)
EOS%: 1.3 % (ref 0.0–7.0)
HCT: 36.6 % (ref 34.8–46.6)
HGB: 12.3 g/dL (ref 11.6–15.9)
LYMPH%: 25 % (ref 14.0–49.7)
MCH: 29 pg (ref 25.1–34.0)
MCHC: 33.5 g/dL (ref 31.5–36.0)
MCV: 86.6 fL (ref 79.5–101.0)
MONO#: 0.4 10*3/uL (ref 0.1–0.9)
MONO%: 11 % (ref 0.0–14.0)
NEUT#: 2.5 10*3/uL (ref 1.5–6.5)
NEUT%: 62.2 % (ref 38.4–76.8)
PLATELETS: 194 10*3/uL (ref 145–400)
RBC: 4.23 10*6/uL (ref 3.70–5.45)
RDW: 12.7 % (ref 11.2–14.5)
WBC: 4.1 10*3/uL (ref 3.9–10.3)
lymph#: 1 10*3/uL (ref 0.9–3.3)

## 2017-07-11 LAB — COMPREHENSIVE METABOLIC PANEL
ALT: 15 U/L (ref 0–55)
ANION GAP: 7 meq/L (ref 3–11)
AST: 16 U/L (ref 5–34)
Albumin: 3.4 g/dL — ABNORMAL LOW (ref 3.5–5.0)
Alkaline Phosphatase: 47 U/L (ref 40–150)
BILIRUBIN TOTAL: 0.48 mg/dL (ref 0.20–1.20)
BUN: 6.7 mg/dL — ABNORMAL LOW (ref 7.0–26.0)
CO2: 24 meq/L (ref 22–29)
Calcium: 9.2 mg/dL (ref 8.4–10.4)
Chloride: 108 mEq/L (ref 98–109)
Creatinine: 0.6 mg/dL (ref 0.6–1.1)
EGFR: >60 mL/min/{1.73_m2} (ref >60)
Glucose: 78 mg/dl (ref 70–140)
Potassium: 3.8 mEq/L (ref 3.5–5.1)
SODIUM: 138 meq/L (ref 136–145)
TOTAL PROTEIN: 6.9 g/dL (ref 6.4–8.3)

## 2017-07-11 LAB — CEA (IN HOUSE-CHCC): CEA (CHCC-IN HOUSE): 2.38 ng/mL (ref 0.00–5.00)

## 2017-07-11 MED ORDER — SODIUM CHLORIDE 0.9 % IV SOLN
Freq: Once | INTRAVENOUS | Status: AC
  Administered 2017-07-11: 13:00:00 via INTRAVENOUS

## 2017-07-11 MED ORDER — SODIUM CHLORIDE 0.9 % IV SOLN
200.0000 mg | Freq: Once | INTRAVENOUS | Status: AC
  Administered 2017-07-11: 200 mg via INTRAVENOUS
  Filled 2017-07-11: qty 8

## 2017-07-11 NOTE — Telephone Encounter (Signed)
Scheduled appt per 11/9 los - Gave patient AVS and calender per los.  

## 2017-07-11 NOTE — Telephone Encounter (Signed)
Notified pt of normal tumor marker. She voiced understanding.

## 2017-07-11 NOTE — Telephone Encounter (Signed)
-----   Message from Ladell Pier, MD sent at 07/11/2017  4:30 PM EST ----- Please call patient,cea is normal

## 2017-07-11 NOTE — Progress Notes (Signed)
  [place] OFFICE PROGRESS NOTE   Diagnosis: Colon cancer  INTERVAL HISTORY:   Meagan [name] returns as scheduled.  She completed another treatment with pembrolizumab on 06/20/2017.  She reports tolerating the treatment well.  No diarrhea or rash.  Good appetite and energy level.  No complaint.  Objective:  Vital signs in last 24 hours:  Blood pressure 116/75, pulse (!) 106, temperature 98.3 F (36.8 C), temperature source Oral, resp. rate 18, height _0  (1.575 m), weight 110 lb 11.2 oz (50.2 kg), SpO2 100 %.   Resp: Lungs clear bilaterally Cardio: Regular rate and rhythm GI: No hepatomegaly, no mass, nontender Vascular: No leg edema  Skin: No tattoos, no rash, faint confluent erythema at the lower leg bilaterally    Lab Results:  Lab Results  Component Value Date   WBC 4.1 07/11/2017   HGB 12.3 07/11/2017   HCT 36.6 07/11/2017   MCV 86.6 07/11/2017   PLT 194 07/11/2017   NEUTROABS 2.5 07/11/2017    CMP     Component Value Date/Time   NA 138 07/11/2017 1026   K 3.8 07/11/2017 1026   CL 108 04/08/2017 0400   CO2 24 07/11/2017 1026   GLUCOSE 78 07/11/2017 1026   BUN 6.7 (L) 07/11/2017 1026   CREATININE 0.6 07/11/2017 1026   CALCIUM 9.2 07/11/2017 1026   PROT 6.9 07/11/2017 1026   ALBUMIN 3.4 (L) 07/11/2017 1026   AST 16 07/11/2017 1026   ALT 15 07/11/2017 1026   ALKPHOS 47 07/11/2017 1026   BILITOT 0.48 07/11/2017 1026   GFRNONAA >60 04/08/2017 0400   GFRAA >60 04/08/2017 0400    Lab Results  Component Value Date   CEA1 4.52 05/13/2017     Medications: I have reviewed the patient's current medications.  Assessment/Plan: 1. Stage IV Colon cancer, cecum, presenting with a palpable low abdominal/pelvic mass ? Colonoscopy 03/31/2017 confirmed a cecum mass, biopsy positive for poorly differentiated adenocarcinoma ? CT abdomen/pelvis 03/29/2017-large necrotic right iliac fossa mass, enlarged right common iliac nodes, loculated pelvic  ascites, right lower quadrant mesenteric masses ? Right colectomy/right oophorectomy-salpingectomy 04/04/2017 ? Pathology from the right colon resection, grade 3 adenocarcinoma of the cecum,pT4b,pN2b ? Loss of MSH6 expression ? PET scan 04/29/2017-residual tumor at the left adnexa, and internal iliac lymph node, and aortocaval node ? Cycle 1 Pembrolizumab09/07/2017 ? Cycle 2 Pembrolizumab 05/30/2017 ? Cycle 3 Pembrolizumab 06/20/2017 ? Cycle 4 Pembrolizumab 07/11/2017  2. 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 Anemia-likely iron deficiency anemia secondary to #1. Improved 05/13/2017.  4.G3 P3  5. Appendectomy  5. Maternal aunt with colon cancer   Disposition: Meagan [name] is tolerating the pembrolizumab well.  She will complete another treatment today.  She will return for office visit and pembrolizumab in 3 weeks.  We will plan a restaging CT evaluation for within the next few months.  We will follow-up on the CEA from today.  Betsy Coder, MD  07/11/2017  1:04 PM

## 2017-07-11 NOTE — Patient Instructions (Addendum)
[facility] Discharge Instructions for Patients Receiving Chemotherapy  Today you received the following chemotherapy agents:  Keytruda.  To help prevent nausea and vomiting after your treatment, we encourage you to take your nausea medication as directed.   If you develop nausea and vomiting that is not controlled by your nausea medication, call the clinic.   BELOW ARE SYMPTOMS THAT SHOULD BE REPORTED IMMEDIATELY:  *FEVER GREATER THAN 100.5 F  *CHILLS WITH OR WITHOUT FEVER  NAUSEA AND VOMITING THAT IS NOT CONTROLLED WITH YOUR NAUSEA MEDICATION  *UNUSUAL SHORTNESS OF BREATH  *UNUSUAL BRUISING OR BLEEDING  TENDERNESS IN MOUTH AND THROAT WITH OR WITHOUT PRESENCE OF ULCERS  *URINARY PROBLEMS  *BOWEL PROBLEMS  UNUSUAL RASH Items with * indicate a potential emergency and should be followed up as soon as possible.  Feel free to call the clinic should you have any questions or concerns. The clinic phone number is [phone].  Please show the CHEMO ALERT CARD at check-in to the Emergency Department and triage nurse.    

## 2017-08-03 ENCOUNTER — Other Ambulatory Visit: Payer: Self-pay | Admitting: Oncology

## 2017-08-04 ENCOUNTER — Ambulatory Visit (HOSPITAL_BASED_OUTPATIENT_CLINIC_OR_DEPARTMENT_OTHER): Payer: Self-pay

## 2017-08-04 ENCOUNTER — Encounter: Payer: Self-pay | Admitting: Nurse Practitioner

## 2017-08-04 ENCOUNTER — Other Ambulatory Visit: Payer: Self-pay | Admitting: Emergency Medicine

## 2017-08-04 ENCOUNTER — Telehealth: Payer: Self-pay | Admitting: Nurse Practitioner

## 2017-08-04 ENCOUNTER — Encounter: Payer: Self-pay | Admitting: General Practice

## 2017-08-04 ENCOUNTER — Other Ambulatory Visit (HOSPITAL_BASED_OUTPATIENT_CLINIC_OR_DEPARTMENT_OTHER): Payer: Self-pay

## 2017-08-04 ENCOUNTER — Ambulatory Visit (HOSPITAL_BASED_OUTPATIENT_CLINIC_OR_DEPARTMENT_OTHER): Payer: Self-pay | Admitting: Nurse Practitioner

## 2017-08-04 VITALS — BP 113/74 | HR 90 | Temp 97.5°F | Resp 18 | Ht 62.0 in | Wt 114.1 lb

## 2017-08-04 DIAGNOSIS — C189 Malignant neoplasm of colon, unspecified: Secondary | ICD-10-CM

## 2017-08-04 DIAGNOSIS — C180 Malignant neoplasm of cecum: Secondary | ICD-10-CM

## 2017-08-04 DIAGNOSIS — Z5112 Encounter for antineoplastic immunotherapy: Secondary | ICD-10-CM

## 2017-08-04 DIAGNOSIS — D500 Iron deficiency anemia secondary to blood loss (chronic): Secondary | ICD-10-CM

## 2017-08-04 DIAGNOSIS — C778 Secondary and unspecified malignant neoplasm of lymph nodes of multiple regions: Secondary | ICD-10-CM

## 2017-08-04 LAB — CBC WITH DIFFERENTIAL/PLATELET
BASO%: 0.4 % (ref 0.0–2.0)
Basophils Absolute: 0 10*3/uL (ref 0.0–0.1)
EOS ABS: 0.1 10*3/uL (ref 0.0–0.5)
EOS%: 2.4 % (ref 0.0–7.0)
HEMATOCRIT: 37.2 % (ref 34.8–46.6)
HEMOGLOBIN: 12.6 g/dL (ref 11.6–15.9)
LYMPH%: 17.7 % (ref 14.0–49.7)
MCH: 29 pg (ref 25.1–34.0)
MCHC: 33.9 g/dL (ref 31.5–36.0)
MCV: 85.6 fL (ref 79.5–101.0)
MONO#: 0.5 10*3/uL (ref 0.1–0.9)
MONO%: 8.2 % (ref 0.0–14.0)
NEUT%: 71.3 % (ref 38.4–76.8)
NEUTROS ABS: 4.2 10*3/uL (ref 1.5–6.5)
PLATELETS: 278 10*3/uL (ref 145–400)
RBC: 4.35 10*6/uL (ref 3.70–5.45)
RDW: 12.6 % (ref 11.2–14.5)
WBC: 5.9 10*3/uL (ref 3.9–10.3)
lymph#: 1 10*3/uL (ref 0.9–3.3)

## 2017-08-04 LAB — COMPREHENSIVE METABOLIC PANEL
ALT: 13 U/L (ref 0–55)
ANION GAP: 7 meq/L (ref 3–11)
AST: 13 U/L (ref 5–34)
Albumin: 3.5 g/dL (ref 3.5–5.0)
Alkaline Phosphatase: 57 U/L (ref 40–150)
BUN: 8.5 mg/dL (ref 7.0–26.0)
CALCIUM: 9.1 mg/dL (ref 8.4–10.4)
CHLORIDE: 108 meq/L (ref 98–109)
CO2: 23 mEq/L (ref 22–29)
Creatinine: 0.7 mg/dL (ref 0.6–1.1)
EGFR: >60 mL/min/{1.73_m2} (ref >60)
Glucose: 94 mg/dl (ref 70–140)
POTASSIUM: 3.7 meq/L (ref 3.5–5.1)
Sodium: 138 mEq/L (ref 136–145)
Total Bilirubin: 0.26 mg/dL (ref 0.20–1.20)
Total Protein: 7.4 g/dL (ref 6.4–8.3)

## 2017-08-04 MED ORDER — LORAZEPAM 0.5 MG PO TABS: 0.5000 mg | ORAL_TABLET | Freq: Three times a day (TID) | ORAL | 0 refills | Status: DC | PRN

## 2017-08-04 MED ORDER — SODIUM CHLORIDE 0.9 % IV SOLN
Freq: Once | INTRAVENOUS | Status: AC
  Administered 2017-08-04: 10:00:00 via INTRAVENOUS

## 2017-08-04 MED ORDER — SODIUM CHLORIDE 0.9 % IV SOLN
200.0000 mg | Freq: Once | INTRAVENOUS | Status: AC
  Administered 2017-08-04: 200 mg via INTRAVENOUS
  Filled 2017-08-04: qty 8

## 2017-08-04 NOTE — Patient Instructions (Signed)
[facility] Discharge Instructions for Patients Receiving Chemotherapy  Today you received the following chemotherapy agents :  Keytruda.  To help prevent nausea and vomiting after your treatment, we encourage you to take your nausea medication as prescribed.   If you develop nausea and vomiting that is not controlled by your nausea medication, call the clinic.   BELOW ARE SYMPTOMS THAT SHOULD BE REPORTED IMMEDIATELY:  *FEVER GREATER THAN 100.5 F  *CHILLS WITH OR WITHOUT FEVER  NAUSEA AND VOMITING THAT IS NOT CONTROLLED WITH YOUR NAUSEA MEDICATION  *UNUSUAL SHORTNESS OF BREATH  *UNUSUAL BRUISING OR BLEEDING  TENDERNESS IN MOUTH AND THROAT WITH OR WITHOUT PRESENCE OF ULCERS  *URINARY PROBLEMS  *BOWEL PROBLEMS  UNUSUAL RASH Items with * indicate a potential emergency and should be followed up as soon as possible.  Feel free to call the clinic should you have any questions or concerns. The clinic phone number is [phone].  Please show the CHEMO ALERT CARD at check-in to the Emergency Department and triage nurse.  

## 2017-08-04 NOTE — Progress Notes (Signed)
[place] Spiritual Care Note  Meagan [name] was in good spirits in infusion today. She reports that she feels well, has no notable side effects, is gaining weight, and thus feels minimal anxiety as she anticipates scans. Per pt, her family is doing well; 32yo is in [employer] and honor roll, 32yo and 32yo are thriving, and husband enjoys being able to care for her by accompanying her to tx. We plan to keep in touch when she is on campus, but please also page if immediate needs arise. Thank you!   [place], [place], [facility] Pager [phone] Voicemail [phone]

## 2017-08-04 NOTE — Telephone Encounter (Signed)
Scheduled appt per 1/23 los - Gave patient AVS and calender per los.  

## 2017-08-04 NOTE — Progress Notes (Signed)
  [place] OFFICE PROGRESS NOTE   Diagnosis: Colon cancer  INTERVAL HISTORY:   Meagan [name] returns as scheduled.  She completed another cycle of Pembrolizumab 07/11/2017.  She denies nausea/vomiting.  No mouth sores.  No diarrhea.  No rash.  No shortness of breath.  No abdominal pain.  Objective:  Vital signs in last 24 hours:  Blood pressure 113/74, pulse 90, temperature (!) 97.5 F (36.4 C), temperature source Oral, resp. rate 18, height '5\' 2"'$  (1.575 m), weight 114 lb 1.6 oz (51.8 kg), SpO2 100 %.    HEENT: No thrush or ulcers.  Resp: Lungs clear bilaterally. Cardio: Regular rate and rhythm. GI: Abdomen soft and nontender.  No hepatomegaly. Vascular: No leg edema.  Calves soft and nontender.  Skin: No rash.   Lab Results:  Lab Results  Component Value Date   WBC 5.9 08/04/2017   HGB 12.6 08/04/2017   HCT 37.2 08/04/2017   MCV 85.6 08/04/2017   PLT 278 08/04/2017   NEUTROABS 4.2 08/04/2017    Imaging:  No results found.  Medications: I have reviewed the patient's current medications.  Assessment/Plan: 1. Stage IV Colon cancer, cecum, presenting with a palpable low abdominal/pelvic mass ? Colonoscopy 03/31/2017 confirmed a cecum mass, biopsy positive for poorly differentiated adenocarcinoma ? CT abdomen/pelvis 03/29/2017-large necrotic right iliac fossa mass, enlarged right common iliac nodes, loculated pelvic ascites, right lower quadrant mesenteric masses ? Right colectomy/right oophorectomy-salpingectomy 04/04/2017 ? Pathology from the right colon resection, grade 3 adenocarcinoma of the cecum,pT4b,pN2b ? Loss of MSH6 expression ? PET scan 04/29/2017-residual tumor at the left adnexa, and internal iliac lymph node, and aortocaval node ? Cycle 1 Pembrolizumab09/07/2017 ? Cycle 2 Pembrolizumab 05/30/2017 ? Cycle 3 Pembrolizumab 06/20/2017 ? Cycle 4 Pembrolizumab 07/11/2017 ? Cycle 5 Pembrolizumab 08/04/2017  2. Lynch syndrome. She is  positive for a pathogenic mutation in MSH6  3. Anemia-likely iron deficiency anemia secondary to #1. Improved 05/13/2017.  4.G3 P3  5. Appendectomy  5. Maternal aunt with colon cancer   Disposition: Meagan [name] appears stable.  She has completed 4 cycles of Pembrolizumab.  Plan to proceed with cycle 5 today as scheduled.  She will return for follow-up visit and cycle 6 in 3 weeks.  She will contact the office in the interim with any problems.    Ned Card ANP/GNP-BC   08/04/2017  9:23 AM

## 2017-08-22 ENCOUNTER — Other Ambulatory Visit: Payer: Self-pay | Admitting: *Deleted

## 2017-08-22 ENCOUNTER — Other Ambulatory Visit (HOSPITAL_BASED_OUTPATIENT_CLINIC_OR_DEPARTMENT_OTHER): Payer: Self-pay

## 2017-08-22 ENCOUNTER — Ambulatory Visit (HOSPITAL_BASED_OUTPATIENT_CLINIC_OR_DEPARTMENT_OTHER): Payer: Self-pay

## 2017-08-22 ENCOUNTER — Inpatient Hospital Stay (HOSPITAL_BASED_OUTPATIENT_CLINIC_OR_DEPARTMENT_OTHER): Payer: Self-pay | Admitting: Oncology

## 2017-08-22 ENCOUNTER — Telehealth: Payer: Self-pay | Admitting: Nurse Practitioner

## 2017-08-22 VITALS — BP 116/73 | HR 77 | Temp 98.0°F | Resp 18 | Ht 62.0 in | Wt 122.1 lb

## 2017-08-22 DIAGNOSIS — C180 Malignant neoplasm of cecum: Secondary | ICD-10-CM

## 2017-08-22 DIAGNOSIS — Z5112 Encounter for antineoplastic immunotherapy: Secondary | ICD-10-CM

## 2017-08-22 DIAGNOSIS — C778 Secondary and unspecified malignant neoplasm of lymph nodes of multiple regions: Secondary | ICD-10-CM

## 2017-08-22 DIAGNOSIS — C189 Malignant neoplasm of colon, unspecified: Secondary | ICD-10-CM

## 2017-08-22 DIAGNOSIS — D500 Iron deficiency anemia secondary to blood loss (chronic): Secondary | ICD-10-CM

## 2017-08-22 DIAGNOSIS — Z79899 Other long term (current) drug therapy: Secondary | ICD-10-CM

## 2017-08-22 LAB — COMPREHENSIVE METABOLIC PANEL
ALT: 25 U/L (ref 0–55)
AST: 26 U/L (ref 5–34)
Albumin: 3.8 g/dL (ref 3.5–5.0)
Alkaline Phosphatase: 65 U/L (ref 40–150)
Anion Gap: 8 mEq/L (ref 3–11)
BUN: 10.8 mg/dL (ref 7.0–26.0)
CHLORIDE: 107 meq/L (ref 98–109)
CO2: 23 meq/L (ref 22–29)
Calcium: 8.8 mg/dL (ref 8.4–10.4)
Creatinine: 0.7 mg/dL (ref 0.6–1.1)
EGFR: >60 mL/min/{1.73_m2} (ref >60)
GLUCOSE: 80 mg/dL (ref 70–140)
POTASSIUM: 3.7 meq/L (ref 3.5–5.1)
SODIUM: 137 meq/L (ref 136–145)
Total Bilirubin: 0.44 mg/dL (ref 0.20–1.20)
Total Protein: 7.3 g/dL (ref 6.4–8.3)

## 2017-08-22 LAB — CBC WITH DIFFERENTIAL/PLATELET
BASO%: 0.7 % (ref 0.0–2.0)
Basophils Absolute: 0 10*3/uL (ref 0.0–0.1)
EOS ABS: 0.2 10*3/uL (ref 0.0–0.5)
EOS%: 4.9 % (ref 0.0–7.0)
HCT: 39.9 % (ref 34.8–46.6)
HGB: 13.2 g/dL (ref 11.6–15.9)
LYMPH%: 24.5 % (ref 14.0–49.7)
MCH: 28.7 pg (ref 25.1–34.0)
MCHC: 33.1 g/dL (ref 31.5–36.0)
MCV: 86.6 fL (ref 79.5–101.0)
MONO#: 0.3 10*3/uL (ref 0.1–0.9)
MONO%: 6.5 % (ref 0.0–14.0)
NEUT#: 3.2 10*3/uL (ref 1.5–6.5)
NEUT%: 63.4 % (ref 38.4–76.8)
PLATELETS: 242 10*3/uL (ref 145–400)
RBC: 4.61 10*6/uL (ref 3.70–5.45)
RDW: 13.6 % (ref 11.2–14.5)
WBC: 5.1 10*3/uL (ref 3.9–10.3)
lymph#: 1.2 10*3/uL (ref 0.9–3.3)

## 2017-08-22 LAB — TSH: TSH: 29.129 m(IU)/L — ABNORMAL HIGH (ref 0.308–3.960)

## 2017-08-22 MED ORDER — SODIUM CHLORIDE 0.9 % IV SOLN
200.0000 mg | Freq: Once | INTRAVENOUS | Status: AC
  Administered 2017-08-22: 200 mg via INTRAVENOUS
  Filled 2017-08-22: qty 8

## 2017-08-22 MED ORDER — SODIUM CHLORIDE 0.9 % IV SOLN
Freq: Once | INTRAVENOUS | Status: AC
  Administered 2017-08-22: 11:00:00 via INTRAVENOUS

## 2017-08-22 MED ORDER — VARENICLINE TARTRATE 0.5 MG PO TABS: ORAL_TABLET | ORAL | 0 refills | Status: AC

## 2017-08-22 NOTE — Patient Instructions (Signed)
[facility] Discharge Instructions for Patients Receiving Chemotherapy  Today you received the following chemotherapy agents :  Keytruda.  To help prevent nausea and vomiting after your treatment, we encourage you to take your nausea medication as prescribed.   If you develop nausea and vomiting that is not controlled by your nausea medication, call the clinic.   BELOW ARE SYMPTOMS THAT SHOULD BE REPORTED IMMEDIATELY:  *FEVER GREATER THAN 100.5 F  *CHILLS WITH OR WITHOUT FEVER  NAUSEA AND VOMITING THAT IS NOT CONTROLLED WITH YOUR NAUSEA MEDICATION  *UNUSUAL SHORTNESS OF BREATH  *UNUSUAL BRUISING OR BLEEDING  TENDERNESS IN MOUTH AND THROAT WITH OR WITHOUT PRESENCE OF ULCERS  *URINARY PROBLEMS  *BOWEL PROBLEMS  UNUSUAL RASH Items with * indicate a potential emergency and should be followed up as soon as possible.  Feel free to call the clinic should you have any questions or concerns. The clinic phone number is [phone].  Please show the CHEMO ALERT CARD at check-in to the Emergency Department and triage nurse.  

## 2017-08-22 NOTE — Addendum Note (Signed)
Addended by: Sherril Croon on: 08/22/2017 12:08 PM   Modules accepted: Orders

## 2017-08-22 NOTE — Addendum Note (Signed)
Addended by: Brien Few on: 08/22/2017 02:04 PM   Modules accepted: Orders

## 2017-08-22 NOTE — Addendum Note (Signed)
Addended by: Betsy Coder B on: 08/22/2017 05:59 PM   Modules accepted: Orders

## 2017-08-22 NOTE — Progress Notes (Signed)
Spoke with pt in treatment area. Informed her TSH came back elevated. Dr. Benay Spice is adding additional thyroid labs, will call with new medication instructions when/if ordered. Pt voiced understanding.

## 2017-08-22 NOTE — Telephone Encounter (Signed)
Gave avs and calendar for february °

## 2017-08-22 NOTE — Progress Notes (Signed)
  Meagan [name] OFFICE PROGRESS NOTE   Diagnosis: Colon cancer  INTERVAL HISTORY:   [name] returns as scheduled.  She completed another treatment with [facility] 08/04/2017.  She reports tolerating the treatment well.  No diarrhea or rash.  She feels well.  Good appetite.  She is gaining weight.  No pain.  Objective:  Vital signs in last 24 hours:  Blood pressure 116/73, pulse 77, temperature 98 F (36.7 C), temperature source Oral, resp. rate 18, height _0  (1.575 m), weight 122 lb 1.6 oz (55.4 kg), SpO2 100 %.    HEENT: No thrush Resp: Lungs clear bilaterally Cardio: Regular rate and rhythm GI: No hepatomegaly, no mass, nontender Vascular: Leg edema Skin: No rash    Lab Results:  Lab Results  Component Value Date   WBC 5.1 08/22/2017   HGB 13.2 08/22/2017   HCT 39.9 08/22/2017   MCV 86.6 08/22/2017   PLT 242 08/22/2017   NEUTROABS 3.2 08/22/2017    CMP     Component Value Date/Time   NA 137 08/22/2017 0837   K 3.7 08/22/2017 0837   CL 108 04/08/2017 0400   CO2 23 08/22/2017 0837   GLUCOSE 80 08/22/2017 0837   BUN 10.8 08/22/2017 0837   CREATININE 0.7 08/22/2017 0837   CALCIUM 8.8 08/22/2017 0837   PROT 7.3 08/22/2017 0837   ALBUMIN 3.8 08/22/2017 0837   AST 26 08/22/2017 0837   ALT 25 08/22/2017 0837   ALKPHOS 65 08/22/2017 0837   BILITOT 0.44 08/22/2017 0837   GFRNONAA >60 04/08/2017 0400   GFRAA >60 04/08/2017 0400    Lab Results  Component Value Date   CEA1 2.38 07/11/2017     Medications: I have reviewed the patient's current medications.   Assessment/Plan: 1. Stage IV Colon cancer, cecum, presenting with a palpable low abdominal/pelvic mass ? Colonoscopy 03/31/2017 confirmed a cecum mass, biopsy positive for poorly differentiated adenocarcinoma ? CT abdomen/pelvis 03/29/2017-large necrotic right iliac fossa mass, enlarged right common iliac nodes, loculated pelvic ascites, right lower quadrant mesenteric masses ? Right  colectomy/right oophorectomy-salpingectomy 04/04/2017 ? Pathology from the right colon resection, grade 3 adenocarcinoma of the cecum,pT4b,pN2b ? Loss of MSH6 expression ? PET scan 04/29/2017-residual tumor at the left adnexa, and internal iliac lymph node, and aortocaval node ? Cycle 1 Pembrolizumab09/07/2017 ? Cycle 2 Pembrolizumab 05/30/2017 ? Cycle 3 Pembrolizumab 06/20/2017 ? Cycle4 Pembrolizumab 07/11/2017 ? Cycle 5 Pembrolizumab 08/04/2017 ? Cycle 6 pembrolizumab 08/22/2017  2. 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 Anemia-likely iron deficiency anemia secondary to #1. Improved 05/13/2017.  4.G3 P3  5. Appendectomy  5. Maternal aunt with colon cancer     Disposition: Meagan [name] appears stable.  She is tolerating embolism as well.  No evidence for progression of the metastatic colon cancer.  She will complete another treatment with pembrolizumab today.  She currently smokes possibly 1 pack of cigarettes per day.  She request a prescription for Chantix.  Her husband is also starting Chantix.  She does not have a primary physician.  We discussed potential side effects associated with Chantix including suicidal ideations.  She would like to proceed with Chantix.  [name] will return for an office visit and pembrolizumab in 3 weeks.  Betsy Coder, MD  08/22/2017  9:27 AM

## 2017-08-22 NOTE — Progress Notes (Signed)
  Oncology Nurse Navigator Documentation  Navigator Location: [facility] (08/22/17 0940)   )Navigator Encounter Type: Follow-up Appt (08/22/17 0940)                     Patient Visit Type: MedOnc (08/22/17 0940)   Barriers/Navigation Needs: No Questions;No Needs (08/22/17 0940)   Interventions: Psycho-social support (08/22/17 0940)  Met with patient during f/u visit with Dr. Benay Spice. Patient states that she is doing well. No navigation needs identified.          Acuity: Level 1 (08/22/17 0940) Acuity Level 1: Minimal follow up required (08/22/17 0940)       Time Spent with Patient: 15 (08/22/17 0940)

## 2017-08-23 LAB — T4: Thyroxine (T4): 1.4 ug/dL — ABNORMAL LOW (ref 4.5–12.0)

## 2017-08-25 ENCOUNTER — Telehealth: Payer: Self-pay | Admitting: *Deleted

## 2017-08-25 MED ORDER — LEVOTHYROXINE SODIUM 75 MCG PO TABS: 75.0000 ug | ORAL_TABLET | Freq: Every day | ORAL | 0 refills | Status: DC

## 2017-08-25 NOTE — Telephone Encounter (Signed)
Notified pt of new prescription for synthroid. She was expecting call as we had discussed lab result on 12/21. Teach back complete.

## 2017-08-25 NOTE — Telephone Encounter (Signed)
-----   Message from Ladell Pier, MD sent at 08/25/2017  4:48 PM EST ----- Please call patient, thyroid hormone level is low, likely a side effect from pembrolizumab  Start Synthroid, 75ug daily

## 2017-09-06 ENCOUNTER — Other Ambulatory Visit: Payer: Self-pay | Admitting: Oncology

## 2017-09-12 ENCOUNTER — Inpatient Hospital Stay: Payer: Medicaid Other

## 2017-09-12 ENCOUNTER — Other Ambulatory Visit: Payer: Self-pay

## 2017-09-12 ENCOUNTER — Inpatient Hospital Stay (HOSPITAL_BASED_OUTPATIENT_CLINIC_OR_DEPARTMENT_OTHER): Payer: Self-pay | Admitting: Oncology

## 2017-09-12 ENCOUNTER — Inpatient Hospital Stay: Payer: Medicaid Other | Attending: Oncology

## 2017-09-12 ENCOUNTER — Telehealth: Payer: Self-pay | Admitting: Oncology

## 2017-09-12 VITALS — BP 112/71 | HR 85 | Temp 99.3°F | Resp 20 | Wt 130.4 lb

## 2017-09-12 DIAGNOSIS — C189 Malignant neoplasm of colon, unspecified: Secondary | ICD-10-CM

## 2017-09-12 DIAGNOSIS — E039 Hypothyroidism, unspecified: Secondary | ICD-10-CM | POA: Insufficient documentation

## 2017-09-12 DIAGNOSIS — C180 Malignant neoplasm of cecum: Secondary | ICD-10-CM

## 2017-09-12 DIAGNOSIS — Z5112 Encounter for antineoplastic immunotherapy: Secondary | ICD-10-CM | POA: Insufficient documentation

## 2017-09-12 DIAGNOSIS — Z800 Family history of malignant neoplasm of digestive organs: Secondary | ICD-10-CM

## 2017-09-12 LAB — COMPREHENSIVE METABOLIC PANEL
ALT: 13 U/L (ref 0–55)
AST: 16 U/L (ref 5–34)
Albumin: 4 g/dL (ref 3.5–5.0)
Alkaline Phosphatase: 73 U/L (ref 40–150)
Anion gap: 9 (ref 3–11)
BUN: 10 mg/dL (ref 7–26)
CHLORIDE: 107 mmol/L (ref 98–109)
CO2: 24 mmol/L (ref 22–29)
CREATININE: 0.81 mg/dL (ref 0.60–1.10)
Calcium: 8.6 mg/dL (ref 8.4–10.4)
GFR calc Af Amer: >60 mL/min (ref >60)
GFR calc non Af Amer: >60 mL/min (ref >60)
Glucose, Bld: 94 mg/dL (ref 70–140)
Potassium: 3.3 mmol/L (ref 3.3–4.7)
Sodium: 140 mmol/L (ref 136–145)
Total Bilirubin: 0.7 mg/dL (ref 0.2–1.2)
Total Protein: 7.6 g/dL (ref 6.4–8.3)

## 2017-09-12 LAB — CORTISOL: CORTISOL PLASMA: 10 ug/dL

## 2017-09-12 MED ORDER — PEMBROLIZUMAB CHEMO INJECTION 100 MG/4ML
200.0000 mg | Freq: Once | INTRAVENOUS | Status: AC
  Administered 2017-09-12: 200 mg via INTRAVENOUS
  Filled 2017-09-12: qty 8

## 2017-09-12 MED ORDER — SODIUM CHLORIDE 0.9 % IV SOLN
Freq: Once | INTRAVENOUS | Status: AC
  Administered 2017-09-12: 14:00:00 via INTRAVENOUS

## 2017-09-12 MED ORDER — LORAZEPAM 0.5 MG PO TABS: 0.5000 mg | ORAL_TABLET | Freq: Three times a day (TID) | ORAL | 0 refills | Status: DC | PRN

## 2017-09-12 NOTE — Patient Instructions (Signed)
[facility] Discharge Instructions for Patients Receiving Chemotherapy  Today you received the following chemotherapy agents:  Keytruda.  To help prevent nausea and vomiting after your treatment, we encourage you to take your nausea medication as directed.   If you develop nausea and vomiting that is not controlled by your nausea medication, call the clinic.   BELOW ARE SYMPTOMS THAT SHOULD BE REPORTED IMMEDIATELY:  *FEVER GREATER THAN 100.5 F  *CHILLS WITH OR WITHOUT FEVER  NAUSEA AND VOMITING THAT IS NOT CONTROLLED WITH YOUR NAUSEA MEDICATION  *UNUSUAL SHORTNESS OF BREATH  *UNUSUAL BRUISING OR BLEEDING  TENDERNESS IN MOUTH AND THROAT WITH OR WITHOUT PRESENCE OF ULCERS  *URINARY PROBLEMS  *BOWEL PROBLEMS  UNUSUAL RASH Items with * indicate a potential emergency and should be followed up as soon as possible.  Feel free to call the clinic should you have any questions or concerns. The clinic phone number is [phone].  Please show the CHEMO ALERT CARD at check-in to the Emergency Department and triage nurse.    

## 2017-09-12 NOTE — Progress Notes (Addendum)
No CBC needed for Keytruda today per Dr. Benay Spice.   Wylene Simmer, BSN, RN 09/12/2017 1:24 PM

## 2017-09-12 NOTE — Progress Notes (Signed)
[facility] OFFICE PROGRESS NOTE   Diagnosis: Colon cancer  INTERVAL HISTORY:   [name] returns as scheduled.  She completed another treatment with pembrolizumab 08/22/2017.  She feels well.  No rash or diarrhea.  The thyroid hormone level was low when she was here on 08/22/2017.  She again thyroid hormone replacement.  Objective:  Vital signs in last 24 hours:  Blood pressure 112/71, pulse 85, temperature 99.3 F (37.4 C), temperature source Oral, resp. rate 20, weight 130 lb 6.4 oz (59.1 kg), SpO2 100 %.   Resp: Lungs clear bilaterally Cardio: Regular rate and rhythm GI: No hepatomegaly, nontender, no mass Vascular: No leg edema  Skin: No rash   Lab Results:  Lab Results  Component Value Date   WBC 5.1 08/22/2017   HGB 13.2 08/22/2017   HCT 39.9 08/22/2017   MCV 86.6 08/22/2017   PLT 242 08/22/2017   NEUTROABS 3.2 08/22/2017    CMP     Component Value Date/Time   NA 140 09/12/2017 1118   NA 137 08/22/2017 0837   K 3.3 09/12/2017 1118   K 3.7 08/22/2017 0837   CL 107 09/12/2017 1118   CO2 24 09/12/2017 1118   CO2 23 08/22/2017 0837   GLUCOSE 94 09/12/2017 1118   GLUCOSE 80 08/22/2017 0837   BUN 10 09/12/2017 1118   BUN 10.8 08/22/2017 0837   CREATININE 0.81 09/12/2017 1118   CREATININE 0.7 08/22/2017 0837   CALCIUM 8.6 09/12/2017 1118   CALCIUM 8.8 08/22/2017 0837   PROT 7.6 09/12/2017 1118   PROT 7.3 08/22/2017 0837   ALBUMIN 4.0 09/12/2017 1118   ALBUMIN 3.8 08/22/2017 0837   AST 16 09/12/2017 1118   AST 26 08/22/2017 0837   ALT 13 09/12/2017 1118   ALT 25 08/22/2017 0837   ALKPHOS 73 09/12/2017 1118   ALKPHOS 65 08/22/2017 0837   BILITOT 0.7 09/12/2017 1118   BILITOT 0.44 08/22/2017 0837   GFRNONAA >60 09/12/2017 1118   GFRAA >60 09/12/2017 1118    Lab Results  Component Value Date   CEA1 2.38 07/11/2017     Medications: I have reviewed the patient's current medications.   Assessment/Plan: 1. Stage IV Colon  cancer, cecum, presenting with a palpable low abdominal/pelvic mass ? Colonoscopy 03/31/2017 confirmed a cecum mass, biopsy positive for poorly differentiated adenocarcinoma ? CT abdomen/pelvis 03/29/2017-large necrotic right iliac fossa mass, enlarged right common iliac nodes, loculated pelvic ascites, right lower quadrant mesenteric masses ? Right colectomy/right oophorectomy-salpingectomy 04/04/2017 ? Pathology from the right colon resection, grade 3 adenocarcinoma of the cecum,pT4b,pN2b ? Loss of MSH6 expression ? PET scan 04/29/2017-residual tumor at the left adnexa, and internal iliac lymph node, and aortocaval node ? Cycle 1 Pembrolizumab09/07/2017 ? Cycle 2 Pembrolizumab 05/30/2017 ? Cycle 3 Pembrolizumab 06/20/2017 ? Cycle4 Pembrolizumab 07/11/2017 ? Cycle 5 Pembrolizumab 08/04/2017 ? Cycle 6 pembrolizumab 08/22/2017 ? Cycle 7 pembrolizumab 09/12/2017  2. 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 Anemia-likely iron deficiency anemia secondary to #1.  Resolved.  4.G3 P3  5. Appendectomy  5. Maternal aunt with colon cancer  6.   Hypothyroid-likely secondary to toxicity from pembrolizumab, thyroid hormone replacement started 08/22/2017    Disposition: She appears unchanged.  We will follow-up on the thyroid studies from today.  She will complete another treatment with pembrolizumab today. [name] will undergo a restaging CT evaluation prior to a return visit in 3 weeks.  15 minutes were spent with the patient today.  The majority of the time was used for counseling and coordination of care.  Gary Sherrill, MD  09/12/2017  12:58 PM   

## 2017-09-12 NOTE — Telephone Encounter (Signed)
Scheduled appt per 1/11 los - Gave patient AVS and calender per los. - Central radiology to contact patient with schedule

## 2017-09-13 LAB — THYROID PANEL WITH TSH
Free Thyroxine Index: 1.2 (ref 1.2–4.9)
T3 UPTAKE RATIO: 21 % — AB (ref 24–39)
T4 TOTAL: 5.8 ug/dL (ref 4.5–12.0)
TSH: 58.68 u[IU]/mL — ABNORMAL HIGH (ref 0.450–4.500)

## 2017-09-15 ENCOUNTER — Other Ambulatory Visit: Payer: Self-pay | Admitting: Oncology

## 2017-09-28 ENCOUNTER — Other Ambulatory Visit: Payer: Self-pay | Admitting: Oncology

## 2017-09-29 ENCOUNTER — Encounter (HOSPITAL_COMMUNITY): Payer: Self-pay

## 2017-09-29 ENCOUNTER — Ambulatory Visit (HOSPITAL_COMMUNITY)
Admission: RE | Admit: 2017-09-29 | Discharge: 2017-09-29 | Disposition: A | Discharge status: Home / Self Care | Payer: Self-pay | Source: Ambulatory Visit | Attending: Oncology | Admitting: Oncology

## 2017-09-29 DIAGNOSIS — C189 Malignant neoplasm of colon, unspecified: Secondary | ICD-10-CM | POA: Insufficient documentation

## 2017-09-29 DIAGNOSIS — R229 Localized swelling, mass and lump, unspecified: Secondary | ICD-10-CM | POA: Insufficient documentation

## 2017-09-29 DIAGNOSIS — D259 Leiomyoma of uterus, unspecified: Secondary | ICD-10-CM | POA: Insufficient documentation

## 2017-09-29 HISTORY — DX: Malignant (primary) neoplasm, unspecified: C80.1

## 2017-09-29 MED ORDER — IOPAMIDOL (ISOVUE-300) INJECTION 61%
100.0000 mL | Freq: Once | INTRAVENOUS | Status: AC | PRN
  Administered 2017-09-29: 100 mL via INTRAVENOUS

## 2017-09-29 MED ORDER — IOPAMIDOL (ISOVUE-300) INJECTION 61%
INTRAVENOUS | Status: AC
  Filled 2017-09-29: qty 100

## 2017-10-02 ENCOUNTER — Other Ambulatory Visit: Payer: Self-pay | Admitting: Oncology

## 2017-10-02 DIAGNOSIS — C189 Malignant neoplasm of colon, unspecified: Secondary | ICD-10-CM

## 2017-10-03 ENCOUNTER — Inpatient Hospital Stay: Payer: Self-pay

## 2017-10-03 ENCOUNTER — Encounter: Payer: Self-pay | Admitting: General Practice

## 2017-10-03 ENCOUNTER — Telehealth: Payer: Self-pay | Admitting: Oncology

## 2017-10-03 ENCOUNTER — Inpatient Hospital Stay: Payer: Self-pay | Attending: Oncology | Admitting: Oncology

## 2017-10-03 VITALS — BP 121/79 | HR 89 | Temp 98.9°F | Resp 17 | Ht 62.0 in | Wt 135.5 lb

## 2017-10-03 DIAGNOSIS — C189 Malignant neoplasm of colon, unspecified: Secondary | ICD-10-CM

## 2017-10-03 DIAGNOSIS — Z5112 Encounter for antineoplastic immunotherapy: Secondary | ICD-10-CM | POA: Insufficient documentation

## 2017-10-03 DIAGNOSIS — E039 Hypothyroidism, unspecified: Secondary | ICD-10-CM | POA: Insufficient documentation

## 2017-10-03 DIAGNOSIS — C180 Malignant neoplasm of cecum: Secondary | ICD-10-CM | POA: Insufficient documentation

## 2017-10-03 LAB — CMP (CANCER CENTER ONLY)
ALK PHOS: 66 U/L (ref 40–150)
ALT: 10 U/L (ref 0–55)
AST: 15 U/L (ref 5–34)
Albumin: 4.1 g/dL (ref 3.5–5.0)
Anion gap: 9 (ref 3–11)
BILIRUBIN TOTAL: 0.6 mg/dL (ref 0.2–1.2)
BUN: 11 mg/dL (ref 7–26)
CALCIUM: 8.8 mg/dL (ref 8.4–10.4)
CO2: 24 mmol/L (ref 22–29)
CREATININE: 0.76 mg/dL (ref 0.60–1.10)
Chloride: 105 mmol/L (ref 98–109)
Glucose, Bld: 80 mg/dL (ref 70–140)
Potassium: 3.5 mmol/L (ref 3.5–5.1)
Sodium: 138 mmol/L (ref 136–145)
Total Protein: 7.8 g/dL (ref 6.4–8.3)

## 2017-10-03 MED ORDER — SODIUM CHLORIDE 0.9 % IV SOLN
Freq: Once | INTRAVENOUS | Status: AC
  Administered 2017-10-03: 13:00:00 via INTRAVENOUS

## 2017-10-03 MED ORDER — LORAZEPAM 0.5 MG PO TABS: 0.5000 mg | ORAL_TABLET | Freq: Three times a day (TID) | ORAL | 0 refills | Status: DC | PRN

## 2017-10-03 MED ORDER — PEMBROLIZUMAB CHEMO INJECTION 100 MG/4ML
200.0000 mg | Freq: Once | INTRAVENOUS | Status: AC
  Administered 2017-10-03: 200 mg via INTRAVENOUS
  Filled 2017-10-03: qty 8

## 2017-10-03 NOTE — Progress Notes (Signed)
[facility] Spiritual Care Note  Followed up with Kijana briefly in infusion. Despite tiredness, she is relieved and delighted to be in clinical remission, which will free her to focus on family and other priorities. She shared the printout of her CT report as a symbol of this new and very welcome phase. Delia knows to contact team with any needs or questions, but please also page if immediate needs arise or circumstances change. Thank you.   [place], [place], [facility] Pager [phone] Voicemail [phone]

## 2017-10-03 NOTE — Patient Instructions (Signed)
[facility] Discharge Instructions for Patients Receiving Chemotherapy  Today you received the following chemotherapy agents:  Keytruda.  To help prevent nausea and vomiting after your treatment, we encourage you to take your nausea medication as directed.   If you develop nausea and vomiting that is not controlled by your nausea medication, call the clinic.   BELOW ARE SYMPTOMS THAT SHOULD BE REPORTED IMMEDIATELY:  *FEVER GREATER THAN 100.5 F  *CHILLS WITH OR WITHOUT FEVER  NAUSEA AND VOMITING THAT IS NOT CONTROLLED WITH YOUR NAUSEA MEDICATION  *UNUSUAL SHORTNESS OF BREATH  *UNUSUAL BRUISING OR BLEEDING  TENDERNESS IN MOUTH AND THROAT WITH OR WITHOUT PRESENCE OF ULCERS  *URINARY PROBLEMS  *BOWEL PROBLEMS  UNUSUAL RASH Items with * indicate a potential emergency and should be followed up as soon as possible.  Feel free to call the clinic should you have any questions or concerns. The clinic phone number is [phone].  Please show the CHEMO ALERT CARD at check-in to the Emergency Department and triage nurse.    

## 2017-10-03 NOTE — Progress Notes (Signed)
[facility] OFFICE PROGRESS NOTE   Diagnosis: Colon cancer  INTERVAL HISTORY:   [name] returns as scheduled.  She completed another treatment with pembrolizumab on 09/12/2017.  No rash or diarrhea.  She is taking thyroid hormone replacement.  No complaint.  Objective:  Vital signs in last 24 hours:  Blood pressure 121/79, pulse 89, temperature 98.9 F (37.2 C), temperature source Oral, resp. rate 17, height 5' 2"  (1.575 m), weight 135 lb 8 oz (61.5 kg), last menstrual period 09/27/2017, SpO2 100 %.    HEENT: No thrush or ulcers Resp: Lungs clear bilaterally Cardio: Regular rate and rhythm GI: No hepatomegaly, no mass, nontender, no mass at the umbilicus Vascular: No leg edema    Lab Results:  Lab Results  Component Value Date   WBC 5.1 08/22/2017   HGB 13.2 08/22/2017   HCT 39.9 08/22/2017   MCV 86.6 08/22/2017   PLT 242 08/22/2017   NEUTROABS 3.2 08/22/2017    CMP     Component Value Date/Time   NA 138 10/03/2017 1018   NA 137 08/22/2017 0837   K 3.5 10/03/2017 1018   K 3.7 08/22/2017 0837   CL 105 10/03/2017 1018   CO2 24 10/03/2017 1018   CO2 23 08/22/2017 0837   GLUCOSE 80 10/03/2017 1018   GLUCOSE 80 08/22/2017 0837   BUN 11 10/03/2017 1018   BUN 10.8 08/22/2017 0837   CREATININE 0.81 09/12/2017 1118   CREATININE 0.7 08/22/2017 0837   CALCIUM 8.8 10/03/2017 1018   CALCIUM 8.8 08/22/2017 0837   PROT 7.8 10/03/2017 1018   PROT 7.3 08/22/2017 0837   ALBUMIN 4.1 10/03/2017 1018   ALBUMIN 3.8 08/22/2017 0837   AST 15 10/03/2017 1018   AST 26 08/22/2017 0837   ALT 10 10/03/2017 1018   ALT 25 08/22/2017 0837   ALKPHOS 66 10/03/2017 1018   ALKPHOS 65 08/22/2017 0837   BILITOT 0.6 10/03/2017 1018   BILITOT 0.44 08/22/2017 0837   GFRNONAA >60 10/03/2017 1018   GFRAA >60 10/03/2017 1018    Lab Results  Component Value Date   CEA1 2.38 07/11/2017     Imaging: CT images from 09/29/2017-reviewed with [name]    Medications: I have reviewed the patient's current medications.   Assessment/Plan: 1. Stage IV Colon cancer, cecum, presenting with a palpable low abdominal/pelvic mass ? Colonoscopy 03/31/2017 confirmed a cecum mass, biopsy positive for poorly differentiated adenocarcinoma ? CT abdomen/pelvis 03/29/2017-large necrotic right iliac fossa mass, enlarged right common iliac nodes, loculated pelvic ascites, right lower quadrant mesenteric masses ? Right colectomy/right oophorectomy-salpingectomy 04/04/2017 ? Pathology from the right colon resection, grade 3 adenocarcinoma of the cecum,pT4b,pN2b ? Loss of MSH6 expression ? PET scan 04/29/2017-residual tumor at the left adnexa, and internal iliac lymph node, and aortocaval node ? Cycle 1 Pembrolizumab09/07/2017 ? Cycle 2 Pembrolizumab 05/30/2017 ? Cycle 3 Pembrolizumab 06/20/2017 ? Cycle4 Pembrolizumab 07/11/2017 ? Cycle 5 Pembrolizumab 08/04/2017 ? Cycle 6 pembrolizumab 08/22/2017 ? Cycle 7 pembrolizumab 09/12/2017 ? CTs 09/29/2017-previously noted retroperitoneal and right lower quadrant adenopathy has resolved, necrotic pelvic mass not visualized, single enhancing nodule at the umbilicus is nonspecific ? Cycle 8 pembrolizumab 10/03/2017  2. 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 Anemia-likely iron deficiency anemia secondary to #1.  Resolved.  4.G3 P3  5. Appendectomy  5. Maternal aunt with colon cancer  6.   Hypothyroid-likely secondary to toxicity from pembrolizumab, thyroid hormone replacement started 08/22/2017      Disposition: Ms.  [name] is in clinical remission from colon cancer.  No clear evidence of residual metastatic disease on the restaging CT.  The "nodule "at the umbilicus is most likely scarring from previous abdominal surgery and placement of umbilical rings.  I reviewed the CT images with her.  She will continue pembrolizumab.  She has developed hypothyroidism.   We will follow-up on the TSH from today and adjust the thyroid hormone dose as indicated.  She will return for an office visit and pembrolizumab in 3 weeks.  Betsy Coder, MD  10/03/2017  12:55 PM

## 2017-10-03 NOTE — Telephone Encounter (Signed)
Scheduled appt per 2/1 los - pt aware of appt not print out wanted.

## 2017-10-03 NOTE — Progress Notes (Signed)
Ok to treat without CBC per Dr. Ronnette Juniper, RN

## 2017-10-04 LAB — THYROID PANEL WITH TSH
FREE THYROXINE INDEX: 1.5 (ref 1.2–4.9)
T3 UPTAKE RATIO: 24 % (ref 24–39)
T4, Total: 6.3 ug/dL (ref 4.5–12.0)
TSH: 46.73 u[IU]/mL — AB (ref 0.450–4.500)

## 2017-10-06 ENCOUNTER — Telehealth: Payer: Self-pay | Admitting: Emergency Medicine

## 2017-10-06 NOTE — Telephone Encounter (Addendum)
Left VM regarding this note.   ----- Message from Ladell Pier, MD sent at 10/05/2017  3:21 PM EST ----- Please call patient, continue current thyroid hormone dose, f/u as scheduled, repeat thyroid panel next visit

## 2017-10-19 ENCOUNTER — Other Ambulatory Visit: Payer: Self-pay | Admitting: Oncology

## 2017-10-24 ENCOUNTER — Encounter: Payer: Self-pay | Admitting: Nurse Practitioner

## 2017-10-24 ENCOUNTER — Inpatient Hospital Stay: Payer: Self-pay

## 2017-10-24 ENCOUNTER — Ambulatory Visit: Payer: Medicaid Other

## 2017-10-24 ENCOUNTER — Other Ambulatory Visit: Payer: Medicaid Other

## 2017-10-24 ENCOUNTER — Inpatient Hospital Stay (HOSPITAL_BASED_OUTPATIENT_CLINIC_OR_DEPARTMENT_OTHER): Payer: Self-pay | Admitting: Nurse Practitioner

## 2017-10-24 ENCOUNTER — Telehealth: Payer: Self-pay | Admitting: Oncology

## 2017-10-24 ENCOUNTER — Ambulatory Visit: Payer: Medicaid Other | Admitting: Nurse Practitioner

## 2017-10-24 VITALS — BP 113/80 | HR 88 | Temp 98.4°F | Resp 18 | Ht 62.0 in | Wt 135.6 lb

## 2017-10-24 DIAGNOSIS — C189 Malignant neoplasm of colon, unspecified: Secondary | ICD-10-CM

## 2017-10-24 DIAGNOSIS — C180 Malignant neoplasm of cecum: Secondary | ICD-10-CM

## 2017-10-24 LAB — CBC WITH DIFFERENTIAL/PLATELET
Basophils Absolute: 0 10*3/uL (ref 0.0–0.1)
Basophils Relative: 0 %
EOS PCT: 2 %
Eosinophils Absolute: 0.2 10*3/uL (ref 0.0–0.5)
HEMATOCRIT: 38.4 % (ref 34.8–46.6)
Hemoglobin: 13 g/dL (ref 11.6–15.9)
LYMPHS PCT: 22 %
Lymphs Abs: 1.5 10*3/uL (ref 0.9–3.3)
MCH: 30.9 pg (ref 25.1–34.0)
MCHC: 33.9 g/dL (ref 31.5–36.0)
MCV: 91.2 fL (ref 79.5–101.0)
Monocytes Absolute: 0.5 10*3/uL (ref 0.1–0.9)
Monocytes Relative: 7 %
Neutro Abs: 5 10*3/uL (ref 1.5–6.5)
Neutrophils Relative %: 69 %
PLATELETS: 241 10*3/uL (ref 145–400)
RBC: 4.21 MIL/uL (ref 3.70–5.45)
RDW: 14.8 % — AB (ref 11.2–14.5)
WBC: 7.1 10*3/uL (ref 3.9–10.3)
nRBC: 0 /100 WBC

## 2017-10-24 LAB — CMP (CANCER CENTER ONLY)
ALBUMIN: 3.8 g/dL (ref 3.5–5.0)
ALT: 11 U/L (ref 0–55)
AST: 15 U/L (ref 5–34)
Alkaline Phosphatase: 71 U/L (ref 40–150)
Anion gap: 9 (ref 3–11)
BILIRUBIN TOTAL: 0.6 mg/dL (ref 0.2–1.2)
BUN: 7 mg/dL (ref 7–26)
CHLORIDE: 106 mmol/L (ref 98–109)
CO2: 24 mmol/L (ref 22–29)
Calcium: 9.2 mg/dL (ref 8.4–10.4)
Creatinine: 0.75 mg/dL (ref 0.60–1.10)
GFR, Est AFR Am: >60 mL/min (ref >60)
GFR, Estimated: >60 mL/min (ref >60)
GLUCOSE: 84 mg/dL (ref 70–140)
POTASSIUM: 3.8 mmol/L (ref 3.5–5.1)
SODIUM: 139 mmol/L (ref 136–145)
Total Protein: 7.7 g/dL (ref 6.4–8.3)

## 2017-10-24 LAB — TSH: TSH: 14.148 u[IU]/mL — ABNORMAL HIGH (ref 0.308–3.960)

## 2017-10-24 MED ORDER — SODIUM CHLORIDE 0.9 % IV SOLN
200.0000 mg | Freq: Once | INTRAVENOUS | Status: AC
  Administered 2017-10-24: 200 mg via INTRAVENOUS
  Filled 2017-10-24: qty 8

## 2017-10-24 MED ORDER — SODIUM CHLORIDE 0.9 % IV SOLN
Freq: Once | INTRAVENOUS | Status: AC
  Administered 2017-10-24: 13:00:00 via INTRAVENOUS

## 2017-10-24 NOTE — Telephone Encounter (Signed)
Appointments scheduled, AVS A/ Calendar printed per 2/22 los

## 2017-10-24 NOTE — Patient Instructions (Signed)
[name] Discharge Instructions for Patients Receiving Chemotherapy  Today you received the following chemotherapy agents:  Keytruda.  To help prevent nausea and vomiting after your treatment, we encourage you to take your nausea medication as directed.   If you develop nausea and vomiting that is not controlled by your nausea medication, call the clinic.   BELOW ARE SYMPTOMS THAT SHOULD BE REPORTED IMMEDIATELY:  *FEVER GREATER THAN 100.5 F  *CHILLS WITH OR WITHOUT FEVER  NAUSEA AND VOMITING THAT IS NOT CONTROLLED WITH YOUR NAUSEA MEDICATION  *UNUSUAL SHORTNESS OF BREATH  *UNUSUAL BRUISING OR BLEEDING  TENDERNESS IN MOUTH AND THROAT WITH OR WITHOUT PRESENCE OF ULCERS  *URINARY PROBLEMS  *BOWEL PROBLEMS  UNUSUAL RASH Items with * indicate a potential emergency and should be followed up as soon as possible.  Feel free to call the clinic should you have any questions or concerns. The clinic phone number is [phone].  Please show the CHEMO ALERT CARD at check-in to the Emergency Department and triage nurse.    

## 2017-10-24 NOTE — Progress Notes (Signed)
  [facility] OFFICE PROGRESS NOTE   Diagnosis: Colon cancer  INTERVAL HISTORY:   Meagan [name] returns as scheduled.  She completed another treatment of Pembrolizumab on 10/03/2017.  She feels well.  No nausea or vomiting.  No mouth sores.  No diarrhea.  No rash.  No shortness of breath.  Objective:  Vital signs in last 24 hours:  Blood pressure 113/80, pulse 88, temperature 98.4 F (36.9 C), temperature source Oral, resp. rate 18, height _0  (1.575 m), weight 135 lb 9.6 oz (61.5 kg), last menstrual period 09/27/2017, SpO2 100 %.    HEENT: No thrush or ulcers. Resp: Lungs clear bilaterally. Cardio: Regular rate and rhythm. GI: Abdomen soft and nontender.  No hepatomegaly. Vascular: No leg edema.  Skin: No rash.   Lab Results:  Lab Results  Component Value Date   WBC 7.1 10/24/2017   HGB 13.0 10/24/2017   HCT 38.4 10/24/2017   MCV 91.2 10/24/2017   PLT 241 10/24/2017   NEUTROABS 5.0 10/24/2017    Imaging:  No results found.  Medications: I have reviewed the patient's current medications.  Assessment/Plan: 1. Stage IV Colon cancer, cecum, presenting with a palpable low abdominal/pelvic mass ? Colonoscopy 03/31/2017 confirmed a cecum mass, biopsy positive for poorly differentiated adenocarcinoma ? CT abdomen/pelvis 03/29/2017-large necrotic right iliac fossa mass, enlarged right common iliac nodes, loculated pelvic ascites, right lower quadrant mesenteric masses ? Right colectomy/right oophorectomy-salpingectomy 04/04/2017 ? Pathology from the right colon resection, grade 3 adenocarcinoma of the cecum,pT4b,pN2b ? Loss of MSH6 expression ? PET scan 04/29/2017-residual tumor at the left adnexa, and internal iliac lymph node, and aortocaval node ? Cycle 1 Pembrolizumab09/07/2017 ? Cycle 2 Pembrolizumab 05/30/2017 ? Cycle 3 Pembrolizumab 06/20/2017 ? Cycle4 Pembrolizumab 07/11/2017 ? Cycle 5 Pembrolizumab 08/04/2017 ? Cycle 6 pembrolizumab  08/22/2017 ? Cycle 7 pembrolizumab 09/12/2017 ? CTs 09/29/2017-previously noted retroperitoneal and right lower quadrant adenopathy has resolved, necrotic pelvic mass not visualized, single enhancing nodule at the umbilicus is nonspecific ? Cycle 8 pembrolizumab 10/03/2017  2. 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 Anemia-likely iron deficiency anemia secondary to #1.Resolved.  4.G3 P3  5. Appendectomy  5. Maternal aunt with colon cancer  6.Hypothyroid-likely secondary to toxicity from pembrolizumab, thyroid hormone replacement started 08/22/2017   Disposition: Meagan [name] appears stable.  There is no clinical evidence of disease progression.  Plan to continue Pembrolizumab every 3 weeks.    She continues thyroid hormone replacement.  We will follow-up on the TSH from today and make adjustments as indicated.    We will see her in follow-up on 11/14/2017.  She will contact the office in the interim with any problems.    Ned Card ANP/GNP-BC   10/24/2017  12:22 PM

## 2017-11-04 ENCOUNTER — Other Ambulatory Visit: Payer: Self-pay | Admitting: Oncology

## 2017-11-13 ENCOUNTER — Other Ambulatory Visit: Payer: Self-pay | Admitting: Emergency Medicine

## 2017-11-13 DIAGNOSIS — C189 Malignant neoplasm of colon, unspecified: Secondary | ICD-10-CM

## 2017-11-14 ENCOUNTER — Encounter: Payer: Self-pay | Admitting: Nurse Practitioner

## 2017-11-14 ENCOUNTER — Encounter: Payer: Self-pay | Admitting: General Practice

## 2017-11-14 ENCOUNTER — Inpatient Hospital Stay: Payer: Self-pay | Attending: Oncology

## 2017-11-14 ENCOUNTER — Inpatient Hospital Stay: Payer: Self-pay

## 2017-11-14 ENCOUNTER — Inpatient Hospital Stay (HOSPITAL_BASED_OUTPATIENT_CLINIC_OR_DEPARTMENT_OTHER): Payer: Self-pay | Admitting: Nurse Practitioner

## 2017-11-14 VITALS — BP 116/69 | HR 70 | Temp 98.3°F | Resp 17 | Ht 62.0 in | Wt 141.7 lb

## 2017-11-14 DIAGNOSIS — C189 Malignant neoplasm of colon, unspecified: Secondary | ICD-10-CM

## 2017-11-14 DIAGNOSIS — C180 Malignant neoplasm of cecum: Secondary | ICD-10-CM | POA: Insufficient documentation

## 2017-11-14 DIAGNOSIS — Z5112 Encounter for antineoplastic immunotherapy: Secondary | ICD-10-CM | POA: Insufficient documentation

## 2017-11-14 LAB — CMP (CANCER CENTER ONLY)
ALT: 14 U/L (ref 0–55)
ANION GAP: 7 (ref 3–11)
AST: 13 U/L (ref 5–34)
Albumin: 3.6 g/dL (ref 3.5–5.0)
Alkaline Phosphatase: 64 U/L (ref 40–150)
BUN: 11 mg/dL (ref 7–26)
CHLORIDE: 106 mmol/L (ref 98–109)
CO2: 25 mmol/L (ref 22–29)
CREATININE: 0.74 mg/dL (ref 0.60–1.10)
Calcium: 9.1 mg/dL (ref 8.4–10.4)
Glucose, Bld: 85 mg/dL (ref 70–140)
Potassium: 3.9 mmol/L (ref 3.5–5.1)
Sodium: 138 mmol/L (ref 136–145)
Total Bilirubin: 0.5 mg/dL (ref 0.2–1.2)
Total Protein: 7.3 g/dL (ref 6.4–8.3)

## 2017-11-14 LAB — CBC WITH DIFFERENTIAL (CANCER CENTER ONLY)
Basophils Absolute: 0 10*3/uL (ref 0.0–0.1)
Basophils Relative: 0 %
EOS ABS: 0.1 10*3/uL (ref 0.0–0.5)
EOS PCT: 2 %
HCT: 40.5 % (ref 34.8–46.6)
Hemoglobin: 13.5 g/dL (ref 11.6–15.9)
LYMPHS ABS: 1.2 10*3/uL (ref 0.9–3.3)
Lymphocytes Relative: 21 %
MCH: 30.8 pg (ref 25.1–34.0)
MCHC: 33.3 g/dL (ref 31.5–36.0)
MCV: 92.5 fL (ref 79.5–101.0)
MONO ABS: 0.4 10*3/uL (ref 0.1–0.9)
MONOS PCT: 7 %
Neutro Abs: 3.9 10*3/uL (ref 1.5–6.5)
Neutrophils Relative %: 70 %
PLATELETS: 204 10*3/uL (ref 145–400)
RBC: 4.38 MIL/uL (ref 3.70–5.45)
RDW: 13.4 % (ref 11.2–14.5)
WBC Count: 5.6 10*3/uL (ref 3.9–10.3)

## 2017-11-14 LAB — TSH: TSH: 7.307 u[IU]/mL — ABNORMAL HIGH (ref 0.308–3.960)

## 2017-11-14 MED ORDER — SODIUM CHLORIDE 0.9 % IV SOLN
Freq: Once | INTRAVENOUS | Status: AC
  Administered 2017-11-14: 13:00:00 via INTRAVENOUS

## 2017-11-14 MED ORDER — SODIUM CHLORIDE 0.9 % IV SOLN
200.0000 mg | Freq: Once | INTRAVENOUS | Status: AC
  Administered 2017-11-14: 200 mg via INTRAVENOUS
  Filled 2017-11-14: qty 8

## 2017-11-14 NOTE — Progress Notes (Signed)
  [facility] OFFICE PROGRESS NOTE   Diagnosis: Colon cancer  INTERVAL HISTORY:   Meagan [name] returns as scheduled.  She completed another cycle of Pembrolizumab 10/24/2017.  She feels well.  No nausea or vomiting.  No mouth sores.  No diarrhea.  No rash.  No shortness of breath.  No cough or fever.  She has a good appetite.  She is gaining weight.  No pain.  Objective:  Vital signs in last 24 hours:  Blood pressure 116/69, pulse 70, temperature 98.3 F (36.8 C), temperature source Oral, resp. rate 17, height '5\' 2"'$  (1.575 m), weight 141 lb 11.2 oz (64.3 kg), SpO2 100 %.    HEENT: No thrush or ulcers. Resp: Lungs clear bilaterally. Cardio: Regular rate and rhythm. GI: Abdomen soft and nontender.  No hepatomegaly. Vascular: No leg edema. Neuro: Alert and oriented. Skin: No rash.   Lab Results:  Lab Results  Component Value Date   WBC 5.6 11/14/2017   HGB 13.0 10/24/2017   HCT 40.5 11/14/2017   MCV 92.5 11/14/2017   PLT 204 11/14/2017   NEUTROABS 3.9 11/14/2017    Imaging:  No results found.  Medications: I have reviewed the patient's current medications.  Assessment/Plan: 1. Stage IV Colon cancer, cecum, presenting with a palpable low abdominal/pelvic mass ? Colonoscopy 03/31/2017 confirmed a cecum mass, biopsy positive for poorly differentiated adenocarcinoma ? CT abdomen/pelvis 03/29/2017-large necrotic right iliac fossa mass, enlarged right common iliac nodes, loculated pelvic ascites, right lower quadrant mesenteric masses ? Right colectomy/right oophorectomy-salpingectomy 04/04/2017 ? Pathology from the right colon resection, grade 3 adenocarcinoma of the cecum,pT4b,pN2b ? Loss of MSH6 expression ? PET scan 04/29/2017-residual tumor at the left adnexa, and internal iliac lymph node, and aortocaval node ? Cycle 1 Pembrolizumab09/07/2017 ? Cycle 2 Pembrolizumab 05/30/2017 ? Cycle 3 Pembrolizumab 06/20/2017 ? Cycle4 Pembrolizumab  07/11/2017 ? Cycle 5 Pembrolizumab 08/04/2017 ? Cycle 6 pembrolizumab 08/22/2017 ? Cycle 7 pembrolizumab 09/12/2017 ? CTs 09/29/2017-previously noted retroperitoneal and right lower quadrant adenopathy has resolved, necrotic pelvic mass not visualized, single enhancing nodule at the umbilicus is nonspecific ? Cycle 8 pembrolizumab 10/03/2017 ? Cycle 9 Pembrolizumab 10/24/2017 ? Cycle 10 Pembrolizumab 11/14/2017  2. 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 Anemia-likely iron deficiency anemia secondary to #1.Resolved.  4.G3 P3  5. Appendectomy  5. Maternal aunt with colon cancer  6.Hypothyroid-likely secondary to toxicity from pembrolizumab, thyroid hormone replacement started 08/22/2017    Disposition: Ms. Biggar appears stable.  She has completed 9 cycles of Pembrolizumab.  Plan to proceed with cycle 10 today as scheduled.  CBC and chemistry panel from today reviewed.  TSH is pending.  She will return for lab, follow-up and Pembrolizumab in 3 weeks.  She will contact the office in the interim with any problems.    Ned Card ANP/GNP-BC   11/14/2017  11:20 AM

## 2017-11-14 NOTE — Progress Notes (Signed)
[facility] CSW Progress Notes  Brochure given for [employer], resource for children w parents diagnosed w cancer.  Patient has child, 9, eligible.  However, lives a distance from event location so may not be possible to attend.  Will consider.  Edwyna Shell, LCSW Clinical Social Worker Phone:  [phone]

## 2017-11-14 NOTE — Patient Instructions (Signed)
[facility] Discharge Instructions for Patients Receiving Chemotherapy  Today you received the following chemotherapy agents:  Keytruda.  To help prevent nausea and vomiting after your treatment, we encourage you to take your nausea medication as directed.   If you develop nausea and vomiting that is not controlled by your nausea medication, call the clinic.   BELOW ARE SYMPTOMS THAT SHOULD BE REPORTED IMMEDIATELY:  *FEVER GREATER THAN 100.5 F  *CHILLS WITH OR WITHOUT FEVER  NAUSEA AND VOMITING THAT IS NOT CONTROLLED WITH YOUR NAUSEA MEDICATION  *UNUSUAL SHORTNESS OF BREATH  *UNUSUAL BRUISING OR BLEEDING  TENDERNESS IN MOUTH AND THROAT WITH OR WITHOUT PRESENCE OF ULCERS  *URINARY PROBLEMS  *BOWEL PROBLEMS  UNUSUAL RASH Items with * indicate a potential emergency and should be followed up as soon as possible.  Feel free to call the clinic should you have any questions or concerns. The clinic phone number is [phone].  Please show the CHEMO ALERT CARD at check-in to the Emergency Department and triage nurse.    

## 2017-11-14 NOTE — Addendum Note (Signed)
Addended by: Owens Shark on: 11/14/2017 03:09 PM   Modules accepted: Orders

## 2017-11-17 ENCOUNTER — Telehealth: Payer: Self-pay | Admitting: *Deleted

## 2017-11-17 NOTE — Telephone Encounter (Signed)
Message from pt requesting to speak with MD. She has questions about port placement. Returned call, pt stated the radiology scheduler answered her questions. She no longer needs to speak with MD.

## 2017-11-26 ENCOUNTER — Other Ambulatory Visit: Payer: Self-pay | Admitting: Radiology

## 2017-11-27 ENCOUNTER — Other Ambulatory Visit: Payer: Self-pay | Admitting: Radiology

## 2017-11-28 ENCOUNTER — Other Ambulatory Visit: Payer: Self-pay | Admitting: Nurse Practitioner

## 2017-11-28 ENCOUNTER — Encounter (HOSPITAL_COMMUNITY): Payer: Self-pay

## 2017-11-28 ENCOUNTER — Ambulatory Visit (HOSPITAL_COMMUNITY)
Admission: RE | Admit: 2017-11-28 | Discharge: 2017-11-28 | Disposition: A | Discharge status: Home / Self Care | Payer: Self-pay | Source: Ambulatory Visit | Attending: Oncology | Admitting: Oncology

## 2017-11-28 ENCOUNTER — Ambulatory Visit (HOSPITAL_COMMUNITY)
Admission: RE | Admit: 2017-11-28 | Discharge: 2017-11-28 | Disposition: A | Discharge status: Home / Self Care | Payer: Self-pay | Source: Ambulatory Visit | Attending: Nurse Practitioner | Admitting: Nurse Practitioner

## 2017-11-28 DIAGNOSIS — F17210 Nicotine dependence, cigarettes, uncomplicated: Secondary | ICD-10-CM | POA: Insufficient documentation

## 2017-11-28 DIAGNOSIS — C189 Malignant neoplasm of colon, unspecified: Secondary | ICD-10-CM | POA: Insufficient documentation

## 2017-11-28 HISTORY — PX: IR FLUORO GUIDE PORT INSERTION RIGHT: IMG5741

## 2017-11-28 HISTORY — PX: IR US GUIDE VASC ACCESS RIGHT: IMG2390

## 2017-11-28 LAB — CBC WITH DIFFERENTIAL/PLATELET
Basophils Absolute: 0 10*3/uL (ref 0.0–0.1)
Basophils Relative: 0 %
EOS PCT: 2 %
Eosinophils Absolute: 0.2 10*3/uL (ref 0.0–0.7)
HCT: 40.7 % (ref 36.0–46.0)
Hemoglobin: 13.9 g/dL (ref 12.0–15.0)
LYMPHS ABS: 1.7 10*3/uL (ref 0.7–4.0)
LYMPHS PCT: 18 %
MCH: 31.8 pg (ref 26.0–34.0)
MCHC: 34.2 g/dL (ref 30.0–36.0)
MCV: 93.1 fL (ref 78.0–100.0)
MONOS PCT: 6 %
Monocytes Absolute: 0.5 10*3/uL (ref 0.1–1.0)
Neutro Abs: 7 10*3/uL (ref 1.7–7.7)
Neutrophils Relative %: 74 %
PLATELETS: 272 10*3/uL (ref 150–400)
RBC: 4.37 MIL/uL (ref 3.87–5.11)
RDW: 12.5 % (ref 11.5–15.5)
WBC: 9.3 10*3/uL (ref 4.0–10.5)

## 2017-11-28 LAB — PROTIME-INR
INR: 1.02
Prothrombin Time: 13.3 seconds (ref 11.4–15.2)

## 2017-11-28 MED ORDER — LIDOCAINE HCL 1 % IJ SOLN: INTRAMUSCULAR | Status: AC | PRN

## 2017-11-28 MED ORDER — FENTANYL CITRATE (PF) 100 MCG/2ML IJ SOLN
INTRAMUSCULAR | Status: AC
  Filled 2017-11-28: qty 2

## 2017-11-28 MED ORDER — FENTANYL CITRATE (PF) 100 MCG/2ML IJ SOLN
INTRAMUSCULAR | Status: AC
  Filled 2017-11-28: qty 4

## 2017-11-28 MED ORDER — CEFAZOLIN SODIUM-DEXTROSE 2-4 GM/100ML-% IV SOLN
INTRAVENOUS | Status: AC
  Administered 2017-11-28: 2 g via INTRAVENOUS
  Filled 2017-11-28: qty 100

## 2017-11-28 MED ORDER — HEPARIN SOD (PORK) LOCK FLUSH 100 UNIT/ML IV SOLN
INTRAVENOUS | Status: AC
  Filled 2017-11-28: qty 5

## 2017-11-28 MED ORDER — HEPARIN SOD (PORK) LOCK FLUSH 100 UNIT/ML IV SOLN
INTRAVENOUS | Status: AC | PRN
  Administered 2017-11-28: 500 [IU] via INTRAVENOUS

## 2017-11-28 MED ORDER — LIDOCAINE HCL (PF) 1 % IJ SOLN
INTRAMUSCULAR | Status: AC | PRN
  Administered 2017-11-28: 20 mL

## 2017-11-28 MED ORDER — SODIUM CHLORIDE 0.9 % IV SOLN
INTRAVENOUS | Status: DC
  Administered 2017-11-28: 13:00:00 via INTRAVENOUS

## 2017-11-28 MED ORDER — MIDAZOLAM HCL 2 MG/2ML IJ SOLN
INTRAMUSCULAR | Status: AC
  Filled 2017-11-28: qty 2

## 2017-11-28 MED ORDER — LIDOCAINE HCL (PF) 1 % IJ SOLN
INTRAMUSCULAR | Status: AC
  Filled 2017-11-28: qty 30

## 2017-11-28 MED ORDER — FENTANYL CITRATE (PF) 100 MCG/2ML IJ SOLN
INTRAMUSCULAR | Status: AC | PRN
  Administered 2017-11-28 (×5): 50 ug via INTRAVENOUS

## 2017-11-28 MED ORDER — MIDAZOLAM HCL 2 MG/2ML IJ SOLN
INTRAMUSCULAR | Status: AC | PRN
  Administered 2017-11-28 (×5): 1 mg via INTRAVENOUS

## 2017-11-28 MED ORDER — MIDAZOLAM HCL 2 MG/2ML IJ SOLN
INTRAMUSCULAR | Status: DC
Start: 2017-11-28 — End: 2017-11-29
  Filled 2017-11-28: qty 4

## 2017-11-28 MED ORDER — CEFAZOLIN SODIUM-DEXTROSE 2-4 GM/100ML-% IV SOLN
2.0000 g | INTRAVENOUS | Status: AC
  Administered 2017-11-28: 2 g via INTRAVENOUS

## 2017-11-28 NOTE — H&P (Signed)
Chief Complaint: Patient was seen in consultation today for port placement at the request of Sherrill,Gary B  Referring Physician(s): Ladell Pier  Supervising Physician: Marybelle Killings  Patient Status: [facility] - Out-pt  History of Present Illness: Meagan [name] is a 32 y.o. female with hx of stage IV colon cancer. She has been receiving immunotherapy and will need to for the next year or so. She is scheduled for port placement for durable IV access. PMHx, meds, labs, allergies reviewed. Has been NPO since before 0800 today Feels well, just a little anxious.  Past Medical History:  Diagnosis Date  . adenocarcinoma of colon dx'd 04/2017   immunotherapy  . Fallopian tube abscess    June 2018 right side  . Genetic testing 05/07/2017   Ms. Lamy underwent genetic counseling and testing for hereditary cancer syndromes on 04/18/2017. 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 Mutations in MSH6 are associated with a hereditary cancer syndrome called Lynch syndrome. For more detailed discussion, please see genetic counseling documentation from 05/07/2017.  Testing was perform  . 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 Zieger underwent genetic counseling and testing for hereditary cancer syndromes on 04/18/2017. 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 Mutations in MSH6 are associated with a hereditary cancer syndrome called Lynch syndrome. For more detailed discussion, please see genetic counseling documentation from 05/07/2017.  Testing was perform  . Ovarian cyst     Past Surgical History:  Procedure Laterality Date  . APPENDECTOMY  2010  . COLON RESECTION Right 04/04/2017   Procedure: OPEN RIGHT COLECTOMY AND RIGHT SALPINGO-OOPHORECTOMY;  Surgeon: Armandina Gemma, MD;  Location: [facility] ORS;  Service: General;  Laterality: Right;  . COLONOSCOPY N/A 03/31/2017   Procedure: COLONOSCOPY;  Surgeon: Mauri Pole, MD;  Location: [facility] ENDOSCOPY;  Service: Endoscopy;  Laterality: N/A;  . CYSTOSCOPY WITH STENT PLACEMENT Bilateral 04/04/2017   Procedure: CYSTOSCOPY WITH STENT PLACEMENT;  Surgeon: Alexis Frock, MD;  Location: [facility] ORS;  Service: Urology;  Laterality: Bilateral;    Allergies: Patient has no known allergies.  Medications: Prior to Admission medications   Medication Sig Start Date End Date Taking? Authorizing Provider  levothyroxine (SYNTHROID, LEVOTHROID) 75 MCG tablet TAKE 1 TABLET BY MOUTH EVERY DAY BEFORE BREAKFAST 11/04/17  Yes Ladell Pier, MD  LORazepam (ATIVAN) 0.5 MG tablet Take 1 tablet (0.5 mg total) by mouth every 8 (eight) hours as needed for anxiety. 10/03/17  Yes Ladell Pier, MD     Family History  Problem Relation Age of Onset  . Leukemia Father 79       "Just got diagnosed, but not the 'serious type'" (sounds like CLL if I had to guess)  . Colon cancer Maternal Uncle   . Colon cancer Maternal Grandmother        d.60s/70s diagnosed with colon cancer after age 22    Social History   Socioeconomic History  . Marital status: Married    Spouse name: Not on file  . Number of children: Not on file  . Years of education: Not on file  . Highest education level: Not on file  Occupational History  . Not on file  Social Needs  . Financial resource strain: Not on file  . Food insecurity:    Worry: Not on file    Inability: Not on file  . Transportation needs:    Medical: Not on file    Non-medical: Not on  file  Tobacco Use  . Smoking status: Current Every Day Smoker  . Smokeless tobacco: Never Used  Substance and Sexual Activity  . Alcohol use: No  . Drug use: No  . Sexual activity: Yes  Lifestyle  . Physical activity:    Days per week: Not on file    Minutes per session: Not on file  . Stress: Not on file  Relationships  . Social connections:    Talks on phone: Not on file    Gets together: Not on  file    Attends religious service: Not on file    Active member of club or organization: Not on file    Attends meetings of clubs or organizations: Not on file    Relationship status: Not on file  Other Topics Concern  . Not on file  Social History Narrative  . Not on file     Review of Systems: A 12 point ROS discussed and pertinent positives are indicated in the HPI above.  All other systems are negative.  Review of Systems  Vital Signs: BP 115/78 (BP Location: Right Arm)   Pulse 88   Temp 99.1 F (37.3 C) (Oral)   Resp 18   SpO2 100%   Physical Exam  Constitutional: She is oriented to person, place, and time. She appears well-developed. No distress.  HENT:  Head: Normocephalic.  Mouth/Throat: Oropharynx is clear and moist.  Neck: Normal range of motion. No tracheal deviation present.  Cardiovascular: Normal rate, regular rhythm and normal heart sounds.  Pulmonary/Chest: Effort normal and breath sounds normal. No respiratory distress.  Neurological: She is alert and oriented to person, place, and time.  Skin: Skin is warm and dry.  Psychiatric: She has a normal mood and affect.    Imaging: No results found.  Labs:  CBC: Recent Labs    07/11/17 1026 08/04/17 0846 08/22/17 0837 10/24/17 1139 11/14/17 1028  WBC 4.1 5.9 5.1 7.1 5.6  HGB 12.3 12.6 13.2 13.0  --   HCT 36.6 37.2 39.9 38.4 40.5  PLT 194 278 242 241 204    COAGS: Recent Labs    04/03/17 0358  INR 1.32  APTT 35    BMP: Recent Labs    09/12/17 1118 10/03/17 1018 10/24/17 1139 11/14/17 1028  NA 140 138 139 138  K 3.3 3.5 3.8 3.9  CL 107 105 106 106  CO2 24 24 24 25   GLUCOSE 94 80 84 85  BUN 10 11 7 11   CALCIUM 8.6 8.8 9.2 9.1  CREATININE 0.81 0.76 0.75 0.74  GFRNONAA >60 >60 >60 >60  GFRAA >60 >60 >60 >60    LIVER FUNCTION TESTS: Recent Labs    09/12/17 1118 10/03/17 1018 10/24/17 1139 11/14/17 1028  BILITOT 0.7 0.6 0.6 0.5  AST 16 15 15 13   ALT 13 10 11 14   ALKPHOS  73 66 71 64  PROT 7.6 7.8 7.7 7.3  ALBUMIN 4.0 4.1 3.8 3.6    TUMOR MARKERS: No results for input(s): AFPTM, CEA, CA199, CHROMGRNA in the last 8760 hours.  Assessment and Plan: Stage IV colon cancer on immunotherapy For port placement Labs pending Risks and benefits of image guided port-a-catheter placement was discussed with the patient including, but not limited to bleeding, infection, pneumothorax, or fibrin sheath development and need for additional procedures.  All of the patient's questions were answered, patient is agreeable to proceed. Consent signed and in chart.    Thank you for this interesting consult.  I greatly  enjoyed meeting Meagan [name] and look forward to participating in their care.  A copy of this report was sent to the requesting provider on this date.  Electronically Signed: Ascencion Dike, PA-C 11/28/2017, 1:27 PM   I spent a total of 20 minutes in face to face in clinical consultation, greater than 50% of which was counseling/coordinating care for port placement

## 2017-11-28 NOTE — Sedation Documentation (Signed)
Patient is resting comfortably with eyes closed at this time. 

## 2017-11-28 NOTE — Sedation Documentation (Signed)
Patient is resting comfortably with eyes closed. Md suturing port pocket at thsi time

## 2017-11-28 NOTE — Discharge Instructions (Signed)
Implanted Port Insertion, Care After °This sheet gives you information about how to care for yourself after your procedure. Your health care provider may also give you more specific instructions. If you have problems or questions, contact your health care provider. °What can I expect after the procedure? °After your procedure, it is common to have: °· Discomfort at the port insertion site. °· Bruising on the skin over the port. This should improve over 3-4 days. ° °Follow these instructions at home: °Port care °· After your port is placed, you will get a manufacturer's information card. The card has information about your port. Keep this card with you at all times. °· Take care of the port as told by your health care provider. Ask your health care provider if you or a family member can get training for taking care of the port at home. A home health care nurse may also take care of the port. °· Make sure to remember what type of port you have. °Incision care °· Follow instructions from your health care provider about how to take care of your port insertion site. Make sure you: °? Wash your hands with soap and water before you change your bandage (dressing). If soap and water are not available, use hand sanitizer. °? Change your dressing as told by your health care provider. °? Leave stitches (sutures), skin glue, or adhesive strips in place. These skin closures may need to stay in place for 2 weeks or longer. If adhesive strip edges start to loosen and curl up, you may trim the loose edges. Do not remove adhesive strips completely unless your health care provider tells you to do that. °· Check your port insertion site every day for signs of infection. Check for: °? More redness, swelling, or pain. °? More fluid or blood. °? Warmth. °? Pus or a bad smell. °General instructions °· Do not take baths, swim, or use a hot tub until your health care provider approves. °· Do not lift anything that is heavier than 10 lb (4.5  kg) for a week, or as told by your health care provider. °· Ask your health care provider when it is okay to: °? Return to work or school. °? Resume usual physical activities or sports. °· Do not drive for 24 hours if you were given a medicine to help you relax (sedative). °· Take over-the-counter and prescription medicines only as told by your health care provider. °· Wear a medical alert bracelet in case of an emergency. This will tell any health care providers that you have a port. °· Keep all follow-up visits as told by your health care provider. This is important. °Contact a health care provider if: °· You cannot flush your port with saline as directed, or you cannot draw blood from the port. °· You have a fever or chills. °· You have more redness, swelling, or pain around your port insertion site. °· You have more fluid or blood coming from your port insertion site. °· Your port insertion site feels warm to the touch. °· You have pus or a bad smell coming from the port insertion site. °Get help right away if: °· You have chest pain or shortness of breath. °· You have bleeding from your port that you cannot control. °Summary °· Take care of the port as told by your health care provider. °· Change your dressing as told by your health care provider. °· Keep all follow-up visits as told by your health care provider. °  This information is not intended to replace advice given to you by your health care provider. Make sure you discuss any questions you have with your health care provider. °Document Released: 06/09/2013 Document Revised: 07/10/2016 Document Reviewed: 07/10/2016 °Elsevier Interactive Patient Education © 2017 [employer] °Moderate Conscious Sedation, Adult, Care After °These instructions provide you with information about caring for yourself after your procedure. Your health care provider may also give you more specific instructions. Your treatment has been planned according to current medical  practices, but problems sometimes occur. Call your health care provider if you have any problems or questions after your procedure. °What can I expect after the procedure? °After your procedure, it is common: °· To feel sleepy for several hours. °· To feel clumsy and have poor balance for several hours. °· To have poor judgment for several hours. °· To vomit if you eat too soon. ° °Follow these instructions at home: °For at least 24 hours after the procedure: ° °· Do not: °? Participate in activities where you could fall or become injured. °? Drive. °? Use heavy machinery. °? Drink alcohol. °? Take sleeping pills or medicines that cause drowsiness. °? Make important decisions or sign legal documents. °? Take care of children on your own. °· Rest. °Eating and drinking °· Follow the diet recommended by your health care provider. °· If you vomit: °? Drink water, juice, or soup when you can drink without vomiting. °? Make sure you have little or no nausea before eating solid foods. °General instructions °· Have a responsible adult stay with you until you are awake and alert. °· Take over-the-counter and prescription medicines only as told by your health care provider. °· If you smoke, do not smoke without supervision. °· Keep all follow-up visits as told by your health care provider. This is important. °Contact a health care provider if: °· You keep feeling nauseous or you keep vomiting. °· You feel light-headed. °· You develop a rash. °· You have a fever. °Get help right away if: °· You have trouble breathing. °This information is not intended to replace advice given to you by your health care provider. Make sure you discuss any questions you have with your health care provider. °Document Released: 06/09/2013 Document Revised: 01/22/2016 Document Reviewed: 12/09/2015 °Elsevier Interactive Patient Education © 2018 [employer] Inc. ° °

## 2017-11-28 NOTE — Procedures (Signed)
RIJV PAC SVC RA EBL 0 Comp 0 

## 2017-11-30 ENCOUNTER — Other Ambulatory Visit: Payer: Self-pay | Admitting: Oncology

## 2017-12-01 ENCOUNTER — Telehealth: Payer: Self-pay | Admitting: *Deleted

## 2017-12-01 DIAGNOSIS — Z95828 Presence of other vascular implants and grafts: Secondary | ICD-10-CM

## 2017-12-01 DIAGNOSIS — C189 Malignant neoplasm of colon, unspecified: Secondary | ICD-10-CM

## 2017-12-01 MED ORDER — LIDOCAINE-PRILOCAINE 2.5-2.5 % EX CREA: TOPICAL_CREAM | CUTANEOUS | 0 refills | Status: DC

## 2017-12-01 NOTE — Telephone Encounter (Signed)
Message from pt reporting she had her port placed, requested EMLA script. Script called to pharmacy, per Dr. Benay Spice. Called pt with instructions for use.

## 2017-12-05 ENCOUNTER — Inpatient Hospital Stay (HOSPITAL_BASED_OUTPATIENT_CLINIC_OR_DEPARTMENT_OTHER): Payer: Self-pay | Admitting: Oncology

## 2017-12-05 ENCOUNTER — Telehealth: Payer: Self-pay | Admitting: Oncology

## 2017-12-05 ENCOUNTER — Inpatient Hospital Stay: Payer: Self-pay

## 2017-12-05 ENCOUNTER — Inpatient Hospital Stay: Payer: Self-pay | Attending: Oncology

## 2017-12-05 ENCOUNTER — Ambulatory Visit: Payer: Medicaid Other | Admitting: Nurse Practitioner

## 2017-12-05 VITALS — BP 120/73 | HR 85 | Temp 99.0°F | Resp 20 | Ht 62.0 in | Wt 144.6 lb

## 2017-12-05 DIAGNOSIS — Z95828 Presence of other vascular implants and grafts: Secondary | ICD-10-CM | POA: Insufficient documentation

## 2017-12-05 DIAGNOSIS — C180 Malignant neoplasm of cecum: Secondary | ICD-10-CM

## 2017-12-05 DIAGNOSIS — C189 Malignant neoplasm of colon, unspecified: Secondary | ICD-10-CM

## 2017-12-05 DIAGNOSIS — Z5112 Encounter for antineoplastic immunotherapy: Secondary | ICD-10-CM | POA: Insufficient documentation

## 2017-12-05 DIAGNOSIS — E039 Hypothyroidism, unspecified: Secondary | ICD-10-CM

## 2017-12-05 LAB — CEA (IN HOUSE-CHCC): CEA (CHCC-IN HOUSE): 2.57 ng/mL (ref 0.00–5.00)

## 2017-12-05 MED ORDER — SODIUM CHLORIDE 0.9% FLUSH
10.0000 mL | Freq: Once | INTRAVENOUS | Status: AC
  Administered 2017-12-05: 10 mL
  Filled 2017-12-05: qty 10

## 2017-12-05 MED ORDER — HEPARIN SOD (PORK) LOCK FLUSH 100 UNIT/ML IV SOLN
500.0000 [IU] | Freq: Once | INTRAVENOUS | Status: DC | PRN
  Filled 2017-12-05: qty 5

## 2017-12-05 MED ORDER — SODIUM CHLORIDE 0.9 % IV SOLN
Freq: Once | INTRAVENOUS | Status: AC
  Administered 2017-12-05: 13:00:00 via INTRAVENOUS

## 2017-12-05 MED ORDER — SODIUM CHLORIDE 0.9% FLUSH
10.0000 mL | INTRAVENOUS | Status: DC | PRN
  Filled 2017-12-05: qty 10

## 2017-12-05 MED ORDER — SODIUM CHLORIDE 0.9 % IV SOLN
200.0000 mg | Freq: Once | INTRAVENOUS | Status: AC
  Administered 2017-12-05: 200 mg via INTRAVENOUS
  Filled 2017-12-05: qty 8

## 2017-12-05 NOTE — Progress Notes (Signed)
[facility] OFFICE PROGRESS NOTE   Diagnosis: Colon cancer  INTERVAL HISTORY:   [name] returns as scheduled.  She completed another treatment with pembrolizumab on 11/14/2017.  No rash or diarrhea.  She feels well.  Good appetite.  She is taking thyroid hormone replacement. She underwent placement of a Port-A-Cath 11/28/2017.   Objective:  Vital signs in last 24 hours:  Blood pressure 120/73, pulse 85, temperature 99 F (37.2 C), temperature source Oral, resp. rate 20, height 5' 2"  (1.575 m), weight 144 lb 9.6 oz (65.6 kg), SpO2 100 %.    Resp: Lungs clear bilaterally Cardio: Regular rate and rhythm GI: No hepatomegaly, nontender, no mass Vascular: No leg edema    Portacath/PICC-without erythema  Lab Results:  Lab Results  Component Value Date   WBC 9.3 11/28/2017   HGB 13.9 11/28/2017   HCT 40.7 11/28/2017   MCV 93.1 11/28/2017   PLT 272 11/28/2017   NEUTROABS 7.0 11/28/2017    CMP     Component Value Date/Time   NA 138 11/14/2017 1028   NA 137 08/22/2017 0837   K 3.9 11/14/2017 1028   K 3.7 08/22/2017 0837   CL 106 11/14/2017 1028   CO2 25 11/14/2017 1028   CO2 23 08/22/2017 0837   GLUCOSE 85 11/14/2017 1028   GLUCOSE 80 08/22/2017 0837   BUN 11 11/14/2017 1028   BUN 10.8 08/22/2017 0837   CREATININE 0.74 11/14/2017 1028   CREATININE 0.7 08/22/2017 0837   CALCIUM 9.1 11/14/2017 1028   CALCIUM 8.8 08/22/2017 0837   PROT 7.3 11/14/2017 1028   PROT 7.3 08/22/2017 0837   ALBUMIN 3.6 11/14/2017 1028   ALBUMIN 3.8 08/22/2017 0837   AST 13 11/14/2017 1028   AST 26 08/22/2017 0837   ALT 14 11/14/2017 1028   ALT 25 08/22/2017 0837   ALKPHOS 64 11/14/2017 1028   ALKPHOS 65 08/22/2017 0837   BILITOT 0.5 11/14/2017 1028   BILITOT 0.44 08/22/2017 0837   GFRNONAA >60 11/14/2017 1028   GFRAA >60 11/14/2017 1028    TSH on 11/14/2017: 7.3 Medications: I have reviewed the patient's current medications.   Assessment/Plan: 1. Stage IV  Colon cancer, cecum, presenting with a palpable low abdominal/pelvic mass ? Colonoscopy 03/31/2017 confirmed a cecum mass, biopsy positive for poorly differentiated adenocarcinoma ? CT abdomen/pelvis 03/29/2017-large necrotic right iliac fossa mass, enlarged right common iliac nodes, loculated pelvic ascites, right lower quadrant mesenteric masses ? Right colectomy/right oophorectomy-salpingectomy 04/04/2017 ? Pathology from the right colon resection, grade 3 adenocarcinoma of the cecum,pT4b,pN2b ? Loss of MSH6 expression ? PET scan 04/29/2017-residual tumor at the left adnexa, and internal iliac lymph node, and aortocaval node ? Cycle 1 Pembrolizumab09/07/2017 ? Cycle 2 Pembrolizumab 05/30/2017 ? Cycle 3 Pembrolizumab 06/20/2017 ? Cycle4 Pembrolizumab 07/11/2017 ? Cycle 5 Pembrolizumab 08/04/2017 ? Cycle 6 pembrolizumab 08/22/2017 ? Cycle 7 pembrolizumab 09/12/2017 ? CTs 09/29/2017-previously noted retroperitoneal and right lower quadrant adenopathy has resolved, necrotic pelvic mass not visualized, single enhancing nodule at the umbilicus is nonspecific ? Cycle 8 pembrolizumab 10/03/2017 ? Cycle 9 Pembrolizumab 10/24/2017 ? Cycle 10 Pembrolizumab 11/14/2017 ? Cycle 11 pembrolizumab 12/05/2017  2. 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 Anemia-likely iron deficiency anemia secondary to #1.Resolved.  4.G3 P3  5. Appendectomy  5. Maternal aunt with colon cancer  6.Hypothyroid-likely secondary to toxicity from pembrolizumab, thyroid hormone replacement started 08/22/2017  7.   Port-A-Cath placement 11/28/2017    Disposition: [name] appears well.  The  plan is to continue pembrolizumab every 3 weeks.  She will complete another treatment today.  She will return for an office visit in 3 weeks. She remains on thyroid hormone replacement.  We will check a TSH when she returns in 3 weeks.  15 minutes were spent with the patient  today.  The majority of the time was used for counseling and coordination of care.  Betsy Coder, MD  12/05/2017  11:06 AM

## 2017-12-05 NOTE — Progress Notes (Signed)
  Oncology Nurse Navigator Documentation  Navigator Location: [facility] (12/05/17 1325)   )Navigator Encounter Type: Other(Infusion Room) (12/05/17 1325)                       Treatment Phase: Treatment (12/05/17 1325) Barriers/Navigation Needs: No Questions;No Needs (12/05/17 1325)   Interventions: Psycho-social support (12/05/17 1325) met with patient and husband to offer support and to assess for navigation needs. We reviewed the progress that the patient has made in the last 8 months. Patient shared that she is enjoying her time with her children and has resumed her pre-diagnosis activities. Patient voiced feeling proud of her weight reflecting on how little she weighed at time of surgery. Patient encouraged to call with questions or concerns.            Acuity: Level 1 (12/05/17 1325)         Time Spent with Patient: 30 (12/05/17 1325)

## 2017-12-05 NOTE — Progress Notes (Signed)
Per Dr. Benay Spice: OK to treat with CMET from 3/29, pt will have labs prior to next treatment. Elmyra Ricks, Infusion RN made aware.

## 2017-12-05 NOTE — Patient Instructions (Signed)
[facility] Discharge Instructions for Patients Receiving Chemotherapy  Today you received the following chemotherapy agents :  Keytruda.  To help prevent nausea and vomiting after your treatment, we encourage you to take your nausea medication as prescribed.   If you develop nausea and vomiting that is not controlled by your nausea medication, call the clinic.   BELOW ARE SYMPTOMS THAT SHOULD BE REPORTED IMMEDIATELY:  *FEVER GREATER THAN 100.5 F  *CHILLS WITH OR WITHOUT FEVER  NAUSEA AND VOMITING THAT IS NOT CONTROLLED WITH YOUR NAUSEA MEDICATION  *UNUSUAL SHORTNESS OF BREATH  *UNUSUAL BRUISING OR BLEEDING  TENDERNESS IN MOUTH AND THROAT WITH OR WITHOUT PRESENCE OF ULCERS  *URINARY PROBLEMS  *BOWEL PROBLEMS  UNUSUAL RASH Items with * indicate a potential emergency and should be followed up as soon as possible.  Feel free to call the clinic should you have any questions or concerns. The clinic phone number is [phone].  Please show the CHEMO ALERT CARD at check-in to the Emergency Department and triage nurse.  

## 2017-12-05 NOTE — Telephone Encounter (Signed)
Scheduled appt per 4/5 los - patient to get an updated schedule in tx area.  

## 2017-12-11 ENCOUNTER — Other Ambulatory Visit: Payer: Self-pay | Admitting: Oncology

## 2017-12-21 ENCOUNTER — Other Ambulatory Visit: Payer: Self-pay | Admitting: Oncology

## 2017-12-26 ENCOUNTER — Telehealth: Payer: Self-pay

## 2017-12-26 ENCOUNTER — Inpatient Hospital Stay: Payer: Self-pay

## 2017-12-26 ENCOUNTER — Inpatient Hospital Stay (HOSPITAL_BASED_OUTPATIENT_CLINIC_OR_DEPARTMENT_OTHER): Payer: Self-pay | Admitting: Oncology

## 2017-12-26 VITALS — BP 120/86 | HR 85 | Temp 98.1°F | Resp 17 | Ht 62.0 in | Wt 147.7 lb

## 2017-12-26 DIAGNOSIS — C189 Malignant neoplasm of colon, unspecified: Secondary | ICD-10-CM

## 2017-12-26 DIAGNOSIS — C180 Malignant neoplasm of cecum: Secondary | ICD-10-CM

## 2017-12-26 DIAGNOSIS — Z95828 Presence of other vascular implants and grafts: Secondary | ICD-10-CM

## 2017-12-26 LAB — CMP (CANCER CENTER ONLY)
ALK PHOS: 56 U/L (ref 40–150)
ALT: 13 U/L (ref 0–55)
AST: 17 U/L (ref 5–34)
Albumin: 3.9 g/dL (ref 3.5–5.0)
Anion gap: 6 (ref 3–11)
BILIRUBIN TOTAL: 0.7 mg/dL (ref 0.2–1.2)
BUN: 9 mg/dL (ref 7–26)
CALCIUM: 9 mg/dL (ref 8.4–10.4)
CO2: 24 mmol/L (ref 22–29)
CREATININE: 0.76 mg/dL (ref 0.60–1.10)
Chloride: 107 mmol/L (ref 98–109)
GFR, Est AFR Am: >60 mL/min (ref >60)
GLUCOSE: 90 mg/dL (ref 70–140)
POTASSIUM: 3.8 mmol/L (ref 3.5–5.1)
Sodium: 137 mmol/L (ref 136–145)
TOTAL PROTEIN: 7.6 g/dL (ref 6.4–8.3)

## 2017-12-26 LAB — CBC WITH DIFFERENTIAL (CANCER CENTER ONLY)
BASOS ABS: 0 10*3/uL (ref 0.0–0.1)
BASOS PCT: 1 %
EOS ABS: 0.1 10*3/uL (ref 0.0–0.5)
EOS PCT: 1 %
HCT: 39.7 % (ref 34.8–46.6)
Hemoglobin: 13.4 g/dL (ref 11.6–15.9)
LYMPHS PCT: 18 %
Lymphs Abs: 1 10*3/uL (ref 0.9–3.3)
MCH: 31 pg (ref 25.1–34.0)
MCHC: 33.9 g/dL (ref 31.5–36.0)
MCV: 91.6 fL (ref 79.5–101.0)
Monocytes Absolute: 0.4 10*3/uL (ref 0.1–0.9)
Monocytes Relative: 8 %
Neutro Abs: 4.1 10*3/uL (ref 1.5–6.5)
Neutrophils Relative %: 72 %
PLATELETS: 244 10*3/uL (ref 145–400)
RBC: 4.33 MIL/uL (ref 3.70–5.45)
RDW: 12.6 % (ref 11.2–14.5)
WBC: 5.7 10*3/uL (ref 3.9–10.3)

## 2017-12-26 LAB — TSH: TSH: 9.954 u[IU]/mL — ABNORMAL HIGH (ref 0.308–3.960)

## 2017-12-26 MED ORDER — SODIUM CHLORIDE 0.9 % IV SOLN
200.0000 mg | Freq: Once | INTRAVENOUS | Status: AC
  Administered 2017-12-26: 200 mg via INTRAVENOUS
  Filled 2017-12-26: qty 8

## 2017-12-26 MED ORDER — HEPARIN SOD (PORK) LOCK FLUSH 100 UNIT/ML IV SOLN
500.0000 [IU] | Freq: Once | INTRAVENOUS | Status: AC | PRN
  Administered 2017-12-26: 500 [IU]
  Filled 2017-12-26: qty 5

## 2017-12-26 MED ORDER — SODIUM CHLORIDE 0.9% FLUSH
10.0000 mL | Freq: Once | INTRAVENOUS | Status: AC
Start: 2017-12-26 — End: 2017-12-26
  Administered 2017-12-26: 10 mL
  Filled 2017-12-26: qty 10

## 2017-12-26 MED ORDER — SODIUM CHLORIDE 0.9% FLUSH
10.0000 mL | INTRAVENOUS | Status: DC | PRN
  Administered 2017-12-26: 10 mL
  Filled 2017-12-26: qty 10

## 2017-12-26 MED ORDER — SODIUM CHLORIDE 0.9 % IV SOLN
Freq: Once | INTRAVENOUS | Status: AC
  Administered 2017-12-26: 12:00:00 via INTRAVENOUS

## 2017-12-26 NOTE — Progress Notes (Signed)
[facility] OFFICE PROGRESS NOTE   Diagnosis: Colon cancer  INTERVAL HISTORY:   [name] returns as scheduled.  She completed another treatment with pembrolizumab on 12/05/2017.  She feels well.  No complaint.  No pain.  No difficulty with bowel function.  No rash or diarrhea.  She is taking thyroid hormone replacement.  Objective:  Vital signs in last 24 hours:  Blood pressure 120/86, pulse 85, temperature 98.1 F (36.7 C), temperature source Oral, resp. rate 17, height 5' 2"  (1.575 m), weight 147 lb 11.2 oz (67 kg), SpO2 100 %.    HEENT: Neck without mass Lymphatics: No cervical or supraclavicular nodes Resp: Lungs clear bilaterally Cardio: Regular rate and rhythm GI: No mass, no hepatosplenomegaly Vascular: No leg edema  Skin: No rash  Portacath/PICC-without erythema  Lab Results:  Lab Results  Component Value Date   WBC 5.7 12/26/2017   HGB 13.4 12/26/2017   HCT 39.7 12/26/2017   MCV 91.6 12/26/2017   PLT 244 12/26/2017   NEUTROABS 4.1 12/26/2017    CMP     Component Value Date/Time   NA 138 11/14/2017 1028   NA 137 08/22/2017 0837   K 3.9 11/14/2017 1028   K 3.7 08/22/2017 0837   CL 106 11/14/2017 1028   CO2 25 11/14/2017 1028   CO2 23 08/22/2017 0837   GLUCOSE 85 11/14/2017 1028   GLUCOSE 80 08/22/2017 0837   BUN 11 11/14/2017 1028   BUN 10.8 08/22/2017 0837   CREATININE 0.74 11/14/2017 1028   CREATININE 0.7 08/22/2017 0837   CALCIUM 9.1 11/14/2017 1028   CALCIUM 8.8 08/22/2017 0837   PROT 7.3 11/14/2017 1028   PROT 7.3 08/22/2017 0837   ALBUMIN 3.6 11/14/2017 1028   ALBUMIN 3.8 08/22/2017 0837   AST 13 11/14/2017 1028   AST 26 08/22/2017 0837   ALT 14 11/14/2017 1028   ALT 25 08/22/2017 0837   ALKPHOS 64 11/14/2017 1028   ALKPHOS 65 08/22/2017 0837   BILITOT 0.5 11/14/2017 1028   BILITOT 0.44 08/22/2017 0837   GFRNONAA >60 11/14/2017 1028   GFRAA >60 11/14/2017 1028    Lab Results  Component Value Date   CEA1 2.57  12/05/2017     Medications: I have reviewed the patient's current medications.   Assessment/Plan: 1. Stage IV Colon cancer, cecum, presenting with a palpable low abdominal/pelvic mass ? Colonoscopy 03/31/2017 confirmed a cecum mass, biopsy positive for poorly differentiated adenocarcinoma ? CT abdomen/pelvis 03/29/2017-large necrotic right iliac fossa mass, enlarged right common iliac nodes, loculated pelvic ascites, right lower quadrant mesenteric masses ? Right colectomy/right oophorectomy-salpingectomy 04/04/2017 ? Pathology from the right colon resection, grade 3 adenocarcinoma of the cecum,pT4b,pN2b ? Loss of MSH6 expression ? PET scan 04/29/2017-residual tumor at the left adnexa, and internal iliac lymph node, and aortocaval node ? Cycle 1 Pembrolizumab09/07/2017 ? Cycle 2 Pembrolizumab 05/30/2017 ? Cycle 3 Pembrolizumab 06/20/2017 ? Cycle4 Pembrolizumab 07/11/2017 ? Cycle 5 Pembrolizumab 08/04/2017 ? Cycle 6 pembrolizumab 08/22/2017 ? Cycle 7 pembrolizumab 09/12/2017 ? CTs 09/29/2017-previously noted retroperitoneal and right lower quadrant adenopathy has resolved, necrotic pelvic mass not visualized, single enhancing nodule at the umbilicus is nonspecific ? Cycle 8 pembrolizumab 10/03/2017 ? Cycle 9 Pembrolizumab 10/24/2017 ? Cycle 10 Pembrolizumab 11/14/2017 ? Cycle 11 pembrolizumab 12/05/2017 ? 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 Anemia-likely iron deficiency anemia secondary to #1.Resolved.  4.G3 P3  5. Appendectomy  5. Maternal aunt with colon cancer  6.Hypothyroid-likely secondary to toxicity from pembrolizumab, thyroid hormone replacement started 08/22/2017  7.   Port-A-Cath placement 11/28/2017   Disposition: [name] appears unchanged.  She will complete another treatment with pembrolizumab today.  She will return for office visit and pembrolizumab in 3 weeks.  We  will follow-up on the TSH from today and adjust the thyroid hormone dose as indicated.  15 minutes were spent with the patient today.  The majority of the time was used for counseling and coordination of care.   Betsy Coder, MD  12/26/2017  11:23 AM

## 2017-12-26 NOTE — Telephone Encounter (Signed)
Printed avs and calender of upcoming appointment. Per 4/26 los 

## 2018-01-01 ENCOUNTER — Other Ambulatory Visit: Payer: Self-pay | Admitting: Nurse Practitioner

## 2018-01-01 ENCOUNTER — Telehealth: Payer: Self-pay | Admitting: Emergency Medicine

## 2018-01-01 DIAGNOSIS — C189 Malignant neoplasm of colon, unspecified: Secondary | ICD-10-CM

## 2018-01-01 MED ORDER — LEVOTHYROXINE SODIUM 88 MCG PO TABS: 88.0000 ug | ORAL_TABLET | Freq: Every day | ORAL | 2 refills | Status: DC

## 2018-01-01 NOTE — Telephone Encounter (Addendum)
Done.  ----- Message from Owens Shark, NP sent at 01/01/2018  3:00 PM EDT ----- Please let her know we are increasing the Synthroid dose.  I sent a new prescription to her pharmacy.

## 2018-01-01 NOTE — Telephone Encounter (Signed)
Pt returned call, instructed her to increase levothyroxine dose to 44mcg. She will pick up new prescription.

## 2018-01-11 ENCOUNTER — Other Ambulatory Visit: Payer: Self-pay | Admitting: Oncology

## 2018-01-16 ENCOUNTER — Inpatient Hospital Stay: Payer: Self-pay | Attending: Oncology | Admitting: Nurse Practitioner

## 2018-01-16 ENCOUNTER — Encounter: Payer: Self-pay | Admitting: Nurse Practitioner

## 2018-01-16 ENCOUNTER — Inpatient Hospital Stay: Payer: Self-pay

## 2018-01-16 ENCOUNTER — Telehealth: Payer: Self-pay | Admitting: Oncology

## 2018-01-16 VITALS — BP 113/78 | HR 76 | Temp 98.7°F | Resp 17 | Ht 62.0 in | Wt 145.6 lb

## 2018-01-16 DIAGNOSIS — C189 Malignant neoplasm of colon, unspecified: Secondary | ICD-10-CM

## 2018-01-16 DIAGNOSIS — C180 Malignant neoplasm of cecum: Secondary | ICD-10-CM | POA: Insufficient documentation

## 2018-01-16 DIAGNOSIS — Z5112 Encounter for antineoplastic immunotherapy: Secondary | ICD-10-CM | POA: Insufficient documentation

## 2018-01-16 DIAGNOSIS — Z95828 Presence of other vascular implants and grafts: Secondary | ICD-10-CM

## 2018-01-16 LAB — CMP (CANCER CENTER ONLY)
ALBUMIN: 3.9 g/dL (ref 3.5–5.0)
ALT: 12 U/L (ref 0–55)
AST: 19 U/L (ref 5–34)
Alkaline Phosphatase: 61 U/L (ref 40–150)
Anion gap: 6 (ref 3–11)
BUN: 6 mg/dL — AB (ref 7–26)
CHLORIDE: 106 mmol/L (ref 98–109)
CO2: 25 mmol/L (ref 22–29)
Calcium: 8.8 mg/dL (ref 8.4–10.4)
Creatinine: 0.76 mg/dL (ref 0.60–1.10)
GFR, Est AFR Am: >60 mL/min (ref >60)
GFR, Estimated: >60 mL/min (ref >60)
GLUCOSE: 87 mg/dL (ref 70–140)
POTASSIUM: 3.8 mmol/L (ref 3.5–5.1)
Sodium: 137 mmol/L (ref 136–145)
Total Bilirubin: 0.4 mg/dL (ref 0.2–1.2)
Total Protein: 7.7 g/dL (ref 6.4–8.3)

## 2018-01-16 LAB — CBC WITH DIFFERENTIAL (CANCER CENTER ONLY)
BASOS ABS: 0 10*3/uL (ref 0.0–0.1)
BASOS PCT: 1 %
EOS ABS: 0.1 10*3/uL (ref 0.0–0.5)
Eosinophils Relative: 2 %
HEMATOCRIT: 41.3 % (ref 34.8–46.6)
HEMOGLOBIN: 13.8 g/dL (ref 11.6–15.9)
Lymphocytes Relative: 29 %
Lymphs Abs: 1.2 10*3/uL (ref 0.9–3.3)
MCH: 30.2 pg (ref 25.1–34.0)
MCHC: 33.5 g/dL (ref 31.5–36.0)
MCV: 90.2 fL (ref 79.5–101.0)
MONOS PCT: 9 %
Monocytes Absolute: 0.4 10*3/uL (ref 0.1–0.9)
NEUTROS ABS: 2.5 10*3/uL (ref 1.5–6.5)
NEUTROS PCT: 59 %
Platelet Count: 212 10*3/uL (ref 145–400)
RBC: 4.58 MIL/uL (ref 3.70–5.45)
RDW: 12.8 % (ref 11.2–14.5)
WBC: 4.2 10*3/uL (ref 3.9–10.3)

## 2018-01-16 LAB — TSH: TSH: 3.676 u[IU]/mL (ref 0.308–3.960)

## 2018-01-16 MED ORDER — HEPARIN SOD (PORK) LOCK FLUSH 100 UNIT/ML IV SOLN
500.0000 [IU] | Freq: Once | INTRAVENOUS | Status: AC | PRN
  Administered 2018-01-16: 500 [IU]
  Filled 2018-01-16: qty 5

## 2018-01-16 MED ORDER — SODIUM CHLORIDE 0.9 % IV SOLN
Freq: Once | INTRAVENOUS | Status: AC
  Administered 2018-01-16: 12:00:00 via INTRAVENOUS

## 2018-01-16 MED ORDER — SODIUM CHLORIDE 0.9% FLUSH
10.0000 mL | Freq: Once | INTRAVENOUS | Status: AC
  Administered 2018-01-16: 10 mL
  Filled 2018-01-16: qty 10

## 2018-01-16 MED ORDER — SODIUM CHLORIDE 0.9 % IV SOLN
200.0000 mg | Freq: Once | INTRAVENOUS | Status: AC
  Administered 2018-01-16: 200 mg via INTRAVENOUS
  Filled 2018-01-16: qty 8

## 2018-01-16 MED ORDER — SODIUM CHLORIDE 0.9% FLUSH
10.0000 mL | INTRAVENOUS | Status: DC | PRN
  Administered 2018-01-16: 10 mL
  Filled 2018-01-16: qty 10

## 2018-01-16 NOTE — Telephone Encounter (Signed)
Appointments scheduled avs/calendar printed per 5/17 los °

## 2018-01-16 NOTE — Patient Instructions (Signed)
[facility]cer Center Discharge Instructions for Patients Receiving Chemotherapy  Today you received the following chemotherapy agents :  Keytruda.  To help prevent nausea and vomiting after your treatment, we encourage you to take your nausea medication as prescribed.   If you develop nausea and vomiting that is not controlled by your nausea medication, call the clinic.   BELOW ARE SYMPTOMS THAT SHOULD BE REPORTED IMMEDIATELY:  *FEVER GREATER THAN 100.5 F  *CHILLS WITH OR WITHOUT FEVER  NAUSEA AND VOMITING THAT IS NOT CONTROLLED WITH YOUR NAUSEA MEDICATION  *UNUSUAL SHORTNESS OF BREATH  *UNUSUAL BRUISING OR BLEEDING  TENDERNESS IN MOUTH AND THROAT WITH OR WITHOUT PRESENCE OF ULCERS  *URINARY PROBLEMS  *BOWEL PROBLEMS  UNUSUAL RASH Items with * indicate a potential emergency and should be followed up as soon as possible.  Feel free to call the clinic should you have any questions or concerns. The clinic phone number is [phone].  Please show the CHEMO ALERT CARD at check-in to the Emergency Department and triage nurse.  

## 2018-01-16 NOTE — Progress Notes (Signed)
  [place] OFFICE PROGRESS NOTE   Diagnosis: Colon cancer  INTERVAL HISTORY:   Meagan [name] returns as scheduled.  She completed another cycle of Pembrolizumab on 12/26/2017.  She denies nausea/vomiting.  No mouth sores.  No diarrhea.  No rash.  No fever, cough or shortness of breath.  She has a good appetite and overall feels well.  Objective:  Vital signs in last 24 hours:  Blood pressure 113/78, pulse 76, temperature 98.7 F (37.1 C), temperature source Oral, resp. rate 17, height _0  (1.575 m), weight 145 lb 9.6 oz (66 kg), SpO2 100 %.    HEENT: No thrush or ulcers. Resp: Lungs clear bilaterally. Cardio: Regular rate and rhythm. GI: Abdomen soft and nontender.  No hepatomegaly. Vascular: No leg edema.  Skin: No rash. Port-A-Cath without erythema.   Lab Results:  Lab Results  Component Value Date   WBC 4.2 01/16/2018   HGB 13.8 01/16/2018   HCT 41.3 01/16/2018   MCV 90.2 01/16/2018   PLT 212 01/16/2018   NEUTROABS 2.5 01/16/2018    Imaging:  No results found.  Medications: I have reviewed the patient's current medications.  Assessment/Plan: 1. Stage IV Colon cancer, cecum, presenting with a palpable low abdominal/pelvic mass ? Colonoscopy 03/31/2017 confirmed a cecum mass, biopsy positive for poorly differentiated adenocarcinoma ? CT abdomen/pelvis 03/29/2017-large necrotic right iliac fossa mass, enlarged right common iliac nodes, loculated pelvic ascites, right lower quadrant mesenteric masses ? Right colectomy/right oophorectomy-salpingectomy 04/04/2017 ? Pathology from the right colon resection, grade 3 adenocarcinoma of the cecum,pT4b,pN2b ? Loss of MSH6 expression ? PET scan 04/29/2017-residual tumor at the left adnexa, and internal iliac lymph node, and aortocaval node ? Cycle 1 Pembrolizumab09/07/2017 ? Cycle 2 Pembrolizumab 05/30/2017 ? Cycle 3 Pembrolizumab 06/20/2017 ? Cycle4 Pembrolizumab 07/11/2017 ? Cycle 5 Pembrolizumab  08/04/2017 ? Cycle 6 pembrolizumab 08/22/2017 ? Cycle 7 pembrolizumab 09/12/2017 ? CTs 09/29/2017-previously noted retroperitoneal and right lower quadrant adenopathy has resolved, necrotic pelvic mass not visualized, single enhancing nodule at the umbilicus is nonspecific ? Cycle 8 pembrolizumab 10/03/2017 ? Cycle 9 Pembrolizumab 10/24/2017 ? Cycle 10 Pembrolizumab 11/14/2017 ? Cycle 11 pembrolizumab 12/05/2017 ? Cycle 12 pembrolizumab 12/26/2017 ? Cycle 13 Pembrolizumab 01/16/2018  2. 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 Anemia-likely iron deficiency anemia secondary to #1.Resolved.  4.G3 P3  5. Appendectomy  5. Maternal aunt with colon cancer  6.Hypothyroid-likely secondary to toxicity from pembrolizumab, thyroid hormone replacement started 08/22/2017  7.Port-A-Cath placement 11/28/2017   Disposition: [name] appears well.  She has completed 12 cycles of Pembrolizumab.  There is no clinical evidence of disease progression.  Plan to proceed with cycle 13 today as scheduled.  She will return for a follow-up visit and treatment in 3 weeks.  She will contact the office in the interim with any problems.    Ned Card ANP/GNP-BC   01/16/2018  11:05 AM

## 2018-02-01 ENCOUNTER — Other Ambulatory Visit: Payer: Self-pay | Admitting: Oncology

## 2018-02-05 ENCOUNTER — Other Ambulatory Visit: Payer: Self-pay | Admitting: Emergency Medicine

## 2018-02-05 DIAGNOSIS — C189 Malignant neoplasm of colon, unspecified: Secondary | ICD-10-CM

## 2018-02-06 ENCOUNTER — Inpatient Hospital Stay: Payer: Self-pay | Attending: Oncology

## 2018-02-06 ENCOUNTER — Inpatient Hospital Stay: Payer: Self-pay

## 2018-02-06 ENCOUNTER — Inpatient Hospital Stay (HOSPITAL_BASED_OUTPATIENT_CLINIC_OR_DEPARTMENT_OTHER): Payer: Medicaid Other | Admitting: Oncology

## 2018-02-06 VITALS — BP 121/83 | HR 77 | Temp 98.4°F | Resp 18 | Ht 62.0 in | Wt 146.6 lb

## 2018-02-06 DIAGNOSIS — Z79899 Other long term (current) drug therapy: Secondary | ICD-10-CM | POA: Insufficient documentation

## 2018-02-06 DIAGNOSIS — C189 Malignant neoplasm of colon, unspecified: Secondary | ICD-10-CM

## 2018-02-06 DIAGNOSIS — Z95828 Presence of other vascular implants and grafts: Secondary | ICD-10-CM

## 2018-02-06 DIAGNOSIS — Z5112 Encounter for antineoplastic immunotherapy: Secondary | ICD-10-CM | POA: Insufficient documentation

## 2018-02-06 DIAGNOSIS — C180 Malignant neoplasm of cecum: Secondary | ICD-10-CM | POA: Insufficient documentation

## 2018-02-06 LAB — CBC WITH DIFFERENTIAL (CANCER CENTER ONLY)
BASOS ABS: 0 10*3/uL (ref 0.0–0.1)
Basophils Relative: 1 %
Eosinophils Absolute: 0.1 10*3/uL (ref 0.0–0.5)
Eosinophils Relative: 2 %
HEMATOCRIT: 40.3 % (ref 34.8–46.6)
HEMOGLOBIN: 13.7 g/dL (ref 11.6–15.9)
Lymphocytes Relative: 17 %
Lymphs Abs: 1.2 10*3/uL (ref 0.9–3.3)
MCH: 30.2 pg (ref 25.1–34.0)
MCHC: 34.1 g/dL (ref 31.5–36.0)
MCV: 88.8 fL (ref 79.5–101.0)
Monocytes Absolute: 0.5 10*3/uL (ref 0.1–0.9)
Monocytes Relative: 7 %
NEUTROS ABS: 5.1 10*3/uL (ref 1.5–6.5)
NEUTROS PCT: 73 %
Platelet Count: 209 10*3/uL (ref 145–400)
RBC: 4.54 MIL/uL (ref 3.70–5.45)
RDW: 12.6 % (ref 11.2–14.5)
WBC Count: 7 10*3/uL (ref 3.9–10.3)

## 2018-02-06 LAB — CMP (CANCER CENTER ONLY)
ALT: 8 U/L (ref 0–55)
ANION GAP: 7 (ref 3–11)
AST: 16 U/L (ref 5–34)
Albumin: 4 g/dL (ref 3.5–5.0)
Alkaline Phosphatase: 64 U/L (ref 40–150)
BILIRUBIN TOTAL: 0.3 mg/dL (ref 0.2–1.2)
BUN: 9 mg/dL (ref 7–26)
CALCIUM: 9.1 mg/dL (ref 8.4–10.4)
CO2: 25 mmol/L (ref 22–29)
CREATININE: 0.75 mg/dL (ref 0.60–1.10)
Chloride: 107 mmol/L (ref 98–109)
GFR, Estimated: >60 mL/min (ref >60)
GLUCOSE: 91 mg/dL (ref 70–140)
Potassium: 3.9 mmol/L (ref 3.5–5.1)
SODIUM: 139 mmol/L (ref 136–145)
TOTAL PROTEIN: 7.7 g/dL (ref 6.4–8.3)

## 2018-02-06 LAB — TSH: TSH: 2.493 u[IU]/mL (ref 0.308–3.960)

## 2018-02-06 MED ORDER — SODIUM CHLORIDE 0.9% FLUSH
10.0000 mL | INTRAVENOUS | Status: DC | PRN
  Administered 2018-02-06: 10 mL
  Filled 2018-02-06: qty 10

## 2018-02-06 MED ORDER — SODIUM CHLORIDE 0.9% FLUSH
10.0000 mL | Freq: Once | INTRAVENOUS | Status: AC
  Administered 2018-02-06: 10 mL
  Filled 2018-02-06: qty 10

## 2018-02-06 MED ORDER — SODIUM CHLORIDE 0.9 % IV SOLN: Freq: Once | INTRAVENOUS | Status: DC

## 2018-02-06 MED ORDER — HEPARIN SOD (PORK) LOCK FLUSH 100 UNIT/ML IV SOLN
500.0000 [IU] | Freq: Once | INTRAVENOUS | Status: AC | PRN
  Administered 2018-02-06: 500 [IU]
  Filled 2018-02-06: qty 5

## 2018-02-06 MED ORDER — SODIUM CHLORIDE 0.9 % IV SOLN
200.0000 mg | Freq: Once | INTRAVENOUS | Status: AC
  Administered 2018-02-06: 200 mg via INTRAVENOUS
  Filled 2018-02-06: qty 8

## 2018-02-06 NOTE — Patient Instructions (Signed)
[facility] Discharge Instructions for Patients Receiving Chemotherapy  Today you received the following chemotherapy agents :  Keytruda.  To help prevent nausea and vomiting after your treatment, we encourage you to take your nausea medication as prescribed.   If you develop nausea and vomiting that is not controlled by your nausea medication, call the clinic.   BELOW ARE SYMPTOMS THAT SHOULD BE REPORTED IMMEDIATELY:  *FEVER GREATER THAN 100.5 F  *CHILLS WITH OR WITHOUT FEVER  NAUSEA AND VOMITING THAT IS NOT CONTROLLED WITH YOUR NAUSEA MEDICATION  *UNUSUAL SHORTNESS OF BREATH  *UNUSUAL BRUISING OR BLEEDING  TENDERNESS IN MOUTH AND THROAT WITH OR WITHOUT PRESENCE OF ULCERS  *URINARY PROBLEMS  *BOWEL PROBLEMS  UNUSUAL RASH Items with * indicate a potential emergency and should be followed up as soon as possible.  Feel free to call the clinic should you have any questions or concerns. The clinic phone number is [phone].  Please show the CHEMO ALERT CARD at check-in to the Emergency Department and triage nurse.  

## 2018-02-06 NOTE — Progress Notes (Signed)
  [name] OFFICE PROGRESS NOTE   Diagnosis: Colon cancer  INTERVAL HISTORY:   [name] returns as scheduled.  She completed another treatment with pembrolizumab 01/16/2018.  She feels well.  No complaint.  No rash or diarrhea.  Objective:  Vital signs in last 24 hours:  Blood pressure 121/83, pulse 77, temperature 98.4 F (36.9 C), temperature source Oral, resp. rate 18, height '5\' 2"'$  (1.575 m), weight 146 lb 9.6 oz (66.5 kg), SpO2 100 %.    HEENT: No thrush Resp: Lungs clear bilaterally Cardio: The rate and rhythm GI: No hepatomegaly, no mass, nontender Vascular: No leg edema  Skin: No rash  Portacath/PICC-without erythema  Lab Results:  Lab Results  Component Value Date   WBC 7.0 02/06/2018   HGB 13.7 02/06/2018   HCT 40.3 02/06/2018   MCV 88.8 02/06/2018   PLT 209 02/06/2018   NEUTROABS 5.1 02/06/2018    CMP  Lab Results  Component Value Date   NA 139 02/06/2018   K 3.9 02/06/2018   CL 107 02/06/2018   CO2 25 02/06/2018   GLUCOSE 91 02/06/2018   BUN 9 02/06/2018   CREATININE 0.75 02/06/2018   CALCIUM 9.1 02/06/2018   PROT 7.7 02/06/2018   ALBUMIN 4.0 02/06/2018   AST 16 02/06/2018   ALT 8 02/06/2018   ALKPHOS 64 02/06/2018   BILITOT 0.3 02/06/2018   GFRNONAA >60 02/06/2018   GFRAA >60 02/06/2018    Lab Results  Component Value Date   CEA1 2.57 12/05/2017     Medications: I have reviewed the patient's current medications.   Assessment/Plan:  1. Stage IV Colon cancer, cecum, presenting with a palpable low abdominal/pelvic mass ? Colonoscopy 03/31/2017 confirmed a cecum mass, biopsy positive for poorly differentiated adenocarcinoma ? CT abdomen/pelvis 03/29/2017-large necrotic right iliac fossa mass, enlarged right common iliac nodes, loculated pelvic ascites, right lower quadrant mesenteric masses ? Right colectomy/right oophorectomy-salpingectomy 04/04/2017 ? Pathology from the right colon resection, grade 3  adenocarcinoma of the cecum,pT4b,pN2b ? Loss of MSH6 expression ? PET scan 04/29/2017-residual tumor at the left adnexa, and internal iliac lymph node, and aortocaval node ? Cycle 1 Pembrolizumab09/07/2017 ? Cycle 2 Pembrolizumab 05/30/2017 ? Cycle 3 Pembrolizumab 06/20/2017 ? Cycle4 Pembrolizumab 07/11/2017 ? Cycle 5 Pembrolizumab 08/04/2017 ? Cycle 6 pembrolizumab 08/22/2017 ? Cycle 7 pembrolizumab 09/12/2017 ? CTs 09/29/2017-previously noted retroperitoneal and right lower quadrant adenopathy has resolved, necrotic pelvic mass not visualized, single enhancing nodule at the umbilicus is nonspecific ? Cycle 8 pembrolizumab 10/03/2017 ? Cycle 9 Pembrolizumab 10/24/2017 ? Cycle 10 Pembrolizumab 11/14/2017 ? Cycle 11 pembrolizumab 12/05/2017 ? Cycle 12 pembrolizumab 12/26/2017 ? Cycle 13 Pembrolizumab 01/16/2018 ? Cycle 14 pembrolizumab 02/06/2018  2. 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 Anemia-likely iron deficiency anemia secondary to #1.Resolved.  4.G3 P3  5. Appendectomy  5. Maternal aunt with colon cancer  6.Hypothyroid-likely secondary to toxicity from pembrolizumab, thyroid hormone replacement started 08/22/2017  7.Port-A-Cath placement 11/28/2017   Disposition: Meagan [name] appears stable.  The plan is to continue Pember Alyssa Mab.  We will follow-up on the TSH from today.  She will return for office visit and pembrolizumab in 3 weeks.  The plan is to schedule a restaging CT evaluation after the 03/20/2018 treatment.  15 minutes were spent with the patient today.  The majority of the time was used for counseling and coordination of care.  Betsy Coder, MD  02/06/2018  11:24 AM

## 2018-02-22 ENCOUNTER — Other Ambulatory Visit: Payer: Self-pay | Admitting: Oncology

## 2018-02-26 ENCOUNTER — Other Ambulatory Visit: Payer: Self-pay | Admitting: *Deleted

## 2018-02-26 DIAGNOSIS — C189 Malignant neoplasm of colon, unspecified: Secondary | ICD-10-CM

## 2018-02-27 ENCOUNTER — Inpatient Hospital Stay (HOSPITAL_BASED_OUTPATIENT_CLINIC_OR_DEPARTMENT_OTHER): Payer: Self-pay | Admitting: Nurse Practitioner

## 2018-02-27 ENCOUNTER — Inpatient Hospital Stay: Payer: Medicaid Other

## 2018-02-27 ENCOUNTER — Telehealth: Payer: Self-pay

## 2018-02-27 ENCOUNTER — Encounter: Payer: Self-pay | Admitting: Nurse Practitioner

## 2018-02-27 VITALS — BP 107/85 | HR 80 | Temp 98.5°F | Resp 18 | Ht 62.0 in | Wt 148.1 lb

## 2018-02-27 DIAGNOSIS — C189 Malignant neoplasm of colon, unspecified: Secondary | ICD-10-CM

## 2018-02-27 DIAGNOSIS — Z95828 Presence of other vascular implants and grafts: Secondary | ICD-10-CM

## 2018-02-27 DIAGNOSIS — C180 Malignant neoplasm of cecum: Secondary | ICD-10-CM

## 2018-02-27 LAB — CBC WITH DIFFERENTIAL (CANCER CENTER ONLY)
BASOS ABS: 0 10*3/uL (ref 0.0–0.1)
BASOS PCT: 0 %
Eosinophils Absolute: 0.1 10*3/uL (ref 0.0–0.5)
Eosinophils Relative: 2 %
HEMATOCRIT: 38.8 % (ref 34.8–46.6)
Hemoglobin: 13.2 g/dL (ref 11.6–15.9)
Lymphocytes Relative: 22 %
Lymphs Abs: 1.3 10*3/uL (ref 0.9–3.3)
MCH: 30.4 pg (ref 25.1–34.0)
MCHC: 34 g/dL (ref 31.5–36.0)
MCV: 89.4 fL (ref 79.5–101.0)
MONO ABS: 0.4 10*3/uL (ref 0.1–0.9)
Monocytes Relative: 7 %
NEUTROS ABS: 4.3 10*3/uL (ref 1.5–6.5)
Neutrophils Relative %: 69 %
Platelet Count: 197 10*3/uL (ref 145–400)
RBC: 4.34 MIL/uL (ref 3.70–5.45)
RDW: 12.6 % (ref 11.2–14.5)
WBC: 6.2 10*3/uL (ref 3.9–10.3)

## 2018-02-27 LAB — COMPREHENSIVE METABOLIC PANEL
ALT: 9 U/L (ref 0–44)
ANION GAP: 10 (ref 5–15)
AST: 15 U/L (ref 15–41)
Albumin: 3.6 g/dL (ref 3.5–5.0)
Alkaline Phosphatase: 46 U/L (ref 38–126)
BUN: 7 mg/dL (ref 6–20)
CHLORIDE: 107 mmol/L (ref 98–111)
CO2: 22 mmol/L (ref 22–32)
Calcium: 8.7 mg/dL — ABNORMAL LOW (ref 8.9–10.3)
Creatinine, Ser: 0.65 mg/dL (ref 0.44–1.00)
GFR calc non Af Amer: >60 mL/min (ref >60)
Glucose, Bld: 85 mg/dL (ref 70–99)
POTASSIUM: 3.7 mmol/L (ref 3.5–5.1)
SODIUM: 139 mmol/L (ref 135–145)
Total Bilirubin: 0.6 mg/dL (ref 0.3–1.2)
Total Protein: 7.2 g/dL (ref 6.5–8.1)

## 2018-02-27 MED ORDER — LIDOCAINE-PRILOCAINE 2.5-2.5 % EX CREA: TOPICAL_CREAM | CUTANEOUS | 3 refills | Status: DC

## 2018-02-27 MED ORDER — HEPARIN SOD (PORK) LOCK FLUSH 100 UNIT/ML IV SOLN
500.0000 [IU] | Freq: Once | INTRAVENOUS | Status: DC | PRN
  Filled 2018-02-27: qty 5

## 2018-02-27 MED ORDER — SODIUM CHLORIDE 0.9% FLUSH
10.0000 mL | INTRAVENOUS | Status: DC | PRN
  Filled 2018-02-27: qty 10

## 2018-02-27 MED ORDER — SODIUM CHLORIDE 0.9 % IV SOLN
Freq: Once | INTRAVENOUS | Status: AC
  Administered 2018-02-27: 14:00:00 via INTRAVENOUS

## 2018-02-27 MED ORDER — SODIUM CHLORIDE 0.9% FLUSH
10.0000 mL | Freq: Once | INTRAVENOUS | Status: AC
  Administered 2018-02-27: 10 mL
  Filled 2018-02-27: qty 10

## 2018-02-27 MED ORDER — SODIUM CHLORIDE 0.9 % IV SOLN
200.0000 mg | Freq: Once | INTRAVENOUS | Status: AC
  Administered 2018-02-27: 200 mg via INTRAVENOUS
  Filled 2018-02-27: qty 8

## 2018-02-27 NOTE — Patient Instructions (Signed)
[facility] Discharge Instructions for Patients Receiving Chemotherapy  Today you received the following chemotherapy agents:  Keytruda.  To help prevent nausea and vomiting after your treatment, we encourage you to take your nausea medication as directed.   If you develop nausea and vomiting that is not controlled by your nausea medication, call the clinic.   BELOW ARE SYMPTOMS THAT SHOULD BE REPORTED IMMEDIATELY:  *FEVER GREATER THAN 100.5 F  *CHILLS WITH OR WITHOUT FEVER  NAUSEA AND VOMITING THAT IS NOT CONTROLLED WITH YOUR NAUSEA MEDICATION  *UNUSUAL SHORTNESS OF BREATH  *UNUSUAL BRUISING OR BLEEDING  TENDERNESS IN MOUTH AND THROAT WITH OR WITHOUT PRESENCE OF ULCERS  *URINARY PROBLEMS  *BOWEL PROBLEMS  UNUSUAL RASH Items with * indicate a potential emergency and should be followed up as soon as possible.  Feel free to call the clinic should you have any questions or concerns. The clinic phone number is [phone].  Please show the CHEMO ALERT CARD at check-in to the Emergency Department and triage nurse.    

## 2018-02-27 NOTE — Telephone Encounter (Signed)
Printed avs and calender of upcoming appointment. Per 6/28 los 

## 2018-02-27 NOTE — Progress Notes (Signed)
  [name] OFF[facility] PROGRESS NOTE   Diagnosis: Colon cancer  INTERVAL HISTORY:   Meagan [name] returns as scheduled.  She completed another treatment with pembrolizumab 02/06/2018.  She denies nausea/vomiting.  No mouth sores.  No diarrhea.  No rash.  No shortness of breath.  She denies pain.  She has a good appetite.  Objective:  Vital signs in last 24 hours:  Blood pressure 107/85, pulse 80, temperature 98.5 F (36.9 C), temperature source Oral, resp. rate 18, height 5' 2" (1.575 m), weight 148 lb 1.6 oz (67.2 kg), SpO2 100 %.    HEENT: No thrush or ulcers. Resp: Lungs clear bilaterally. Cardio: Regular rate and rhythm. GI: Abdomen soft and nontender.  No hepatomegaly. Vascular: No leg edema.  Skin: No rash. Port-A-Cath without erythema.   Lab Results:  Lab Results  Component Value Date   WBC 7.0 02/06/2018   HGB 13.7 02/06/2018   HCT 40.3 02/06/2018   MCV 88.8 02/06/2018   PLT 209 02/06/2018   NEUTROABS 5.1 02/06/2018    Imaging:  No results found.  Medications: I have reviewed the patient's current medications.  Assessment/Plan: 1. Stage IV Colon cancer, cecum, presenting with a palpable low abdominal/pelvic mass ? Colonoscopy 03/31/2017 confirmed a cecum mass, biopsy positive for poorly differentiated adenocarcinoma ? CT abdomen/pelvis 03/29/2017-large necrotic right iliac fossa mass, enlarged right common iliac nodes, loculated pelvic ascites, right lower quadrant mesenteric masses ? Right colectomy/right oophorectomy-salpingectomy 04/04/2017 ? Pathology from the right colon resection, grade 3 adenocarcinoma of the cecum,pT4b,pN2b ? Loss of MSH6 expression ? PET scan 04/29/2017-residual tumor at the left adnexa, and internal iliac lymph node, and aortocaval node ? Cycle 1 Pembrolizumab09/07/2017 ? Cycle 2 Pembrolizumab 05/30/2017 ? Cycle 3 Pembrolizumab 06/20/2017 ? Cycle4 Pembrolizumab 07/11/2017 ? Cycle 5 Pembrolizumab 08/04/2017 ? Cycle 6  pembrolizumab 08/22/2017 ? Cycle 7 pembrolizumab 09/12/2017 ? CTs 09/29/2017-previously noted retroperitoneal and right lower quadrant adenopathy has resolved, necrotic pelvic mass not visualized, single enhancing nodule at the umbilicus is nonspecific ? Cycle 8 pembrolizumab 10/03/2017 ? Cycle 9 Pembrolizumab 10/24/2017 ? Cycle 10 Pembrolizumab 11/14/2017 ? Cycle 11 pembrolizumab 12/05/2017 ? Cycle 12 pembrolizumab 12/26/2017 ? Cycle 13 Pembrolizumab 01/16/2018 ? Cycle 14 pembrolizumab 02/06/2018 ? Cycle 15 Pembrolizumab 02/27/2018  2. 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 Anemia-likely iron deficiency anemia secondary to #1.Resolved.  4.G3 P3  5. Appendectomy  5. Maternal aunt with colon cancer  6.Hypothyroid-likely secondary to toxicity from pembrolizumab, thyroid hormone replacement started 08/22/2017  7.Port-A-Cath placement 11/28/2017    Disposition: Meagan [name] appears well.  She has completed 14 cycles of Pembrolizumab.  Plan to proceed with cycle 15 today as scheduled pending results of outstanding labs.  She will be referred for restaging CT scans after the 03/20/2018 treatment.  She will return for lab, follow-up and the next cycle of Pembrolizumab in 3 weeks.  She will contact the office in the interim with any problems.    Ned Card ANP/GNP-BC   02/27/2018  11:32 AM

## 2018-03-16 ENCOUNTER — Other Ambulatory Visit: Payer: Self-pay | Admitting: Oncology

## 2018-03-20 ENCOUNTER — Inpatient Hospital Stay: Payer: Self-pay

## 2018-03-20 ENCOUNTER — Telehealth: Payer: Self-pay | Admitting: Nurse Practitioner

## 2018-03-20 ENCOUNTER — Inpatient Hospital Stay: Payer: Self-pay | Attending: Oncology | Admitting: Nurse Practitioner

## 2018-03-20 ENCOUNTER — Encounter: Payer: Self-pay | Admitting: Nurse Practitioner

## 2018-03-20 VITALS — BP 118/81 | HR 80 | Temp 98.4°F | Resp 18 | Ht 62.0 in | Wt 148.4 lb

## 2018-03-20 DIAGNOSIS — C180 Malignant neoplasm of cecum: Secondary | ICD-10-CM | POA: Insufficient documentation

## 2018-03-20 DIAGNOSIS — C189 Malignant neoplasm of colon, unspecified: Secondary | ICD-10-CM

## 2018-03-20 DIAGNOSIS — Z5112 Encounter for antineoplastic immunotherapy: Secondary | ICD-10-CM | POA: Insufficient documentation

## 2018-03-20 DIAGNOSIS — Z79899 Other long term (current) drug therapy: Secondary | ICD-10-CM | POA: Insufficient documentation

## 2018-03-20 LAB — CBC WITH DIFFERENTIAL (CANCER CENTER ONLY)
Basophils Absolute: 0 10*3/uL (ref 0.0–0.1)
Basophils Relative: 0 %
EOS ABS: 0.1 10*3/uL (ref 0.0–0.5)
Eosinophils Relative: 2 %
HEMATOCRIT: 41.1 % (ref 34.8–46.6)
HEMOGLOBIN: 14 g/dL (ref 11.6–15.9)
LYMPHS PCT: 23 %
Lymphs Abs: 1.4 10*3/uL (ref 0.9–3.3)
MCH: 30.5 pg (ref 25.1–34.0)
MCHC: 34.1 g/dL (ref 31.5–36.0)
MCV: 89.5 fL (ref 79.5–101.0)
Monocytes Absolute: 0.5 10*3/uL (ref 0.1–0.9)
Monocytes Relative: 8 %
NEUTROS ABS: 4.1 10*3/uL (ref 1.5–6.5)
NEUTROS PCT: 67 %
Platelet Count: 212 10*3/uL (ref 145–400)
RBC: 4.59 MIL/uL (ref 3.70–5.45)
RDW: 13 % (ref 11.2–14.5)
WBC: 6.1 10*3/uL (ref 3.9–10.3)

## 2018-03-20 LAB — CMP (CANCER CENTER ONLY)
ALT: 22 U/L (ref 0–44)
ANION GAP: 7 (ref 5–15)
AST: 20 U/L (ref 15–41)
Albumin: 4.1 g/dL (ref 3.5–5.0)
Alkaline Phosphatase: 65 U/L (ref 38–126)
BUN: 7 mg/dL (ref 6–20)
CO2: 26 mmol/L (ref 22–32)
Calcium: 9.2 mg/dL (ref 8.9–10.3)
Chloride: 104 mmol/L (ref 98–111)
Creatinine: 0.77 mg/dL (ref 0.44–1.00)
GFR, Estimated: >60 mL/min (ref >60)
GLUCOSE: 107 mg/dL — AB (ref 70–99)
Potassium: 3.8 mmol/L (ref 3.5–5.1)
Sodium: 137 mmol/L (ref 135–145)
TOTAL PROTEIN: 7.8 g/dL (ref 6.5–8.1)
Total Bilirubin: 0.7 mg/dL (ref 0.3–1.2)

## 2018-03-20 LAB — TSH: TSH: 5.285 u[IU]/mL — AB (ref 0.308–3.960)

## 2018-03-20 MED ORDER — SODIUM CHLORIDE 0.9% FLUSH
10.0000 mL | INTRAVENOUS | Status: DC | PRN
  Administered 2018-03-20: 10 mL
  Filled 2018-03-20: qty 10

## 2018-03-20 MED ORDER — HEPARIN SOD (PORK) LOCK FLUSH 100 UNIT/ML IV SOLN
500.0000 [IU] | Freq: Once | INTRAVENOUS | Status: AC | PRN
  Administered 2018-03-20: 500 [IU]
  Filled 2018-03-20: qty 5

## 2018-03-20 MED ORDER — SODIUM CHLORIDE 0.9 % IV SOLN
200.0000 mg | Freq: Once | INTRAVENOUS | Status: AC
  Administered 2018-03-20: 200 mg via INTRAVENOUS
  Filled 2018-03-20: qty 8

## 2018-03-20 MED ORDER — SODIUM CHLORIDE 0.9 % IV SOLN
Freq: Once | INTRAVENOUS | Status: AC
  Administered 2018-03-20: 13:00:00 via INTRAVENOUS

## 2018-03-20 NOTE — Patient Instructions (Signed)
[facility] Discharge Instructions for Patients Receiving Chemotherapy  Today you received the following chemotherapy agents :  Keytruda.  To help prevent nausea and vomiting after your treatment, we encourage you to take your nausea medication as prescribed.   If you develop nausea and vomiting that is not controlled by your nausea medication, call the clinic.   BELOW ARE SYMPTOMS THAT SHOULD BE REPORTED IMMEDIATELY:  *FEVER GREATER THAN 100.5 F  *CHILLS WITH OR WITHOUT FEVER  NAUSEA AND VOMITING THAT IS NOT CONTROLLED WITH YOUR NAUSEA MEDICATION  *UNUSUAL SHORTNESS OF BREATH  *UNUSUAL BRUISING OR BLEEDING  TENDERNESS IN MOUTH AND THROAT WITH OR WITHOUT PRESENCE OF ULCERS  *URINARY PROBLEMS  *BOWEL PROBLEMS  UNUSUAL RASH Items with * indicate a potential emergency and should be followed up as soon as possible.  Feel free to call the clinic should you have any questions or concerns. The clinic phone number is [phone].  Please show the CHEMO ALERT CARD at check-in to the Emergency Department and triage nurse.  

## 2018-03-20 NOTE — Progress Notes (Signed)
  [name] OFF[facility] PROGRESS NOTE   Diagnosis: Colon cancer   INTERVAL HISTORY:   [name] returns as scheduled.  She completed another treatment with pembrolizumab 02/27/2018.  She denies nausea/vomiting.  No mouth sores.  No diarrhea.  No rash.  No fever, cough, shortness of breath.  She denies pain.  She has a good appetite.  Objective:  Vital signs in last 24 hours:  Blood pressure 118/81, pulse 80, temperature 98.4 F (36.9 C), temperature source Oral, resp. rate 18, height '5\' 2"'$  (1.575 m), weight 148 lb 6.4 oz (67.3 kg), SpO2 100 %.    HEENT: No thrush or ulcers. Resp: Lungs clear bilaterally. Cardio: Regular rate and rhythm. GI: Abdomen soft and nontender.  No hepatomegaly. Vascular: No leg edema.  Skin: No rash. Port-A-Cath without erythema.   Lab Results:  Lab Results  Component Value Date   WBC 6.2 02/27/2018   HGB 13.2 02/27/2018   HCT 38.8 02/27/2018   MCV 89.4 02/27/2018   PLT 197 02/27/2018   NEUTROABS 4.3 02/27/2018    Imaging:  No results found.  Medications: I have reviewed the patient's current medications.  Assessment/Plan: 1. Stage IV Colon cancer, cecum, presenting with a palpable low abdominal/pelvic mass ? Colonoscopy 03/31/2017 confirmed a cecum mass, biopsy positive for poorly differentiated adenocarcinoma ? CT abdomen/pelvis 03/29/2017-large necrotic right iliac fossa mass, enlarged right common iliac nodes, loculated pelvic ascites, right lower quadrant mesenteric masses ? Right colectomy/right oophorectomy-salpingectomy 04/04/2017 ? Pathology from the right colon resection, grade 3 adenocarcinoma of the cecum,pT4b,pN2b ? Loss of MSH6 expression ? PET scan 04/29/2017-residual tumor at the left adnexa, and internal iliac lymph node, and aortocaval node ? Cycle 1 Pembrolizumab09/07/2017 ? Cycle 2 Pembrolizumab 05/30/2017 ? Cycle 3 Pembrolizumab 06/20/2017 ? Cycle4 Pembrolizumab 07/11/2017 ? Cycle 5 Pembrolizumab  08/04/2017 ? Cycle 6 pembrolizumab 08/22/2017 ? Cycle 7 pembrolizumab 09/12/2017 ? CTs 09/29/2017-previously noted retroperitoneal and right lower quadrant adenopathy has resolved, necrotic pelvic mass not visualized, single enhancing nodule at the umbilicus is nonspecific ? Cycle 8 pembrolizumab 10/03/2017 ? Cycle 9 Pembrolizumab 10/24/2017 ? Cycle 10 Pembrolizumab 11/14/2017 ? Cycle 11 pembrolizumab 12/05/2017 ? Cycle 12 pembrolizumab 12/26/2017 ? Cycle 13 Pembrolizumab 01/16/2018 ? Cycle 14 pembrolizumab 02/06/2018 ? Cycle 15 Pembrolizumab 02/27/2018 ? Cycle 16 Pembrolizumab 03/20/2018  2. 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 Anemia-likely iron deficiency anemia secondary to #1.Resolved.  4.G3 P3  5. Appendectomy  5. Maternal aunt with colon cancer  6.Hypothyroid-likely secondary to toxicity from pembrolizumab, thyroid hormone replacement started 08/22/2017  7.Port-A-Cath placement 11/28/2017   Disposition: Meagan [name] appears stable.  She has completed 15 cycles of Pembrolizumab.  She continues to tolerate treatment well.  Plan to proceed with cycle 16 today as scheduled.  She will undergo restaging CTs prior to her next visit in 3 weeks.  She will return for lab, follow-up and Pembrolizumab on 04/10/2018.  She will contact the office in the interim with any problems.    Ned Card ANP/GNP-BC   03/20/2018  11:00 AM

## 2018-03-20 NOTE — Telephone Encounter (Signed)
Central radiology to contact patient with ct scan . - no other appts added per 7/19 los.

## 2018-03-23 ENCOUNTER — Other Ambulatory Visit: Payer: Self-pay | Admitting: Oncology

## 2018-03-23 DIAGNOSIS — C189 Malignant neoplasm of colon, unspecified: Secondary | ICD-10-CM

## 2018-04-02 ENCOUNTER — Encounter: Payer: Self-pay | Admitting: Gastroenterology

## 2018-04-04 ENCOUNTER — Other Ambulatory Visit: Payer: Self-pay | Admitting: Oncology

## 2018-04-07 ENCOUNTER — Other Ambulatory Visit: Payer: Self-pay | Admitting: Nurse Practitioner

## 2018-04-07 DIAGNOSIS — C189 Malignant neoplasm of colon, unspecified: Secondary | ICD-10-CM

## 2018-04-09 ENCOUNTER — Ambulatory Visit (HOSPITAL_COMMUNITY)
Admission: RE | Admit: 2018-04-09 | Discharge: 2018-04-09 | Disposition: A | Discharge status: Home / Self Care | Payer: Medicaid Other | Source: Ambulatory Visit | Attending: Nurse Practitioner | Admitting: Nurse Practitioner

## 2018-04-09 DIAGNOSIS — Z90721 Acquired absence of ovaries, unilateral: Secondary | ICD-10-CM | POA: Insufficient documentation

## 2018-04-09 DIAGNOSIS — Z9049 Acquired absence of other specified parts of digestive tract: Secondary | ICD-10-CM | POA: Insufficient documentation

## 2018-04-09 DIAGNOSIS — C189 Malignant neoplasm of colon, unspecified: Secondary | ICD-10-CM

## 2018-04-09 MED ORDER — IOHEXOL 300 MG/ML  SOLN
100.0000 mL | Freq: Once | INTRAMUSCULAR | Status: AC | PRN
  Administered 2018-04-09: 100 mL via INTRAVENOUS

## 2018-04-10 ENCOUNTER — Encounter: Payer: Self-pay | Admitting: Oncology

## 2018-04-10 ENCOUNTER — Inpatient Hospital Stay: Payer: Self-pay

## 2018-04-10 ENCOUNTER — Inpatient Hospital Stay: Payer: Self-pay | Attending: Oncology

## 2018-04-10 ENCOUNTER — Inpatient Hospital Stay (HOSPITAL_BASED_OUTPATIENT_CLINIC_OR_DEPARTMENT_OTHER): Payer: Self-pay | Admitting: Oncology

## 2018-04-10 ENCOUNTER — Other Ambulatory Visit: Payer: Self-pay | Admitting: Emergency Medicine

## 2018-04-10 ENCOUNTER — Telehealth: Payer: Self-pay | Admitting: Oncology

## 2018-04-10 VITALS — BP 112/82 | HR 87 | Temp 98.1°F | Resp 18 | Ht 62.0 in | Wt 152.1 lb

## 2018-04-10 DIAGNOSIS — C189 Malignant neoplasm of colon, unspecified: Secondary | ICD-10-CM

## 2018-04-10 DIAGNOSIS — Z5112 Encounter for antineoplastic immunotherapy: Secondary | ICD-10-CM | POA: Insufficient documentation

## 2018-04-10 DIAGNOSIS — Z95828 Presence of other vascular implants and grafts: Secondary | ICD-10-CM

## 2018-04-10 DIAGNOSIS — C180 Malignant neoplasm of cecum: Secondary | ICD-10-CM | POA: Insufficient documentation

## 2018-04-10 LAB — CBC WITH DIFFERENTIAL (CANCER CENTER ONLY)
Basophils Absolute: 0 10*3/uL (ref 0.0–0.1)
Basophils Relative: 0 %
Eosinophils Absolute: 0.1 10*3/uL (ref 0.0–0.5)
Eosinophils Relative: 2 %
HEMATOCRIT: 39.9 % (ref 34.8–46.6)
Hemoglobin: 13.6 g/dL (ref 11.6–15.9)
LYMPHS ABS: 1.1 10*3/uL (ref 0.9–3.3)
LYMPHS PCT: 17 %
MCH: 30.3 pg (ref 25.1–34.0)
MCHC: 34 g/dL (ref 31.5–36.0)
MCV: 88.9 fL (ref 79.5–101.0)
MONOS PCT: 8 %
Monocytes Absolute: 0.5 10*3/uL (ref 0.1–0.9)
NEUTROS ABS: 4.9 10*3/uL (ref 1.5–6.5)
Neutrophils Relative %: 73 %
Platelet Count: 219 10*3/uL (ref 145–400)
RBC: 4.48 MIL/uL (ref 3.70–5.45)
RDW: 13.3 % (ref 11.2–14.5)
WBC Count: 6.6 10*3/uL (ref 3.9–10.3)

## 2018-04-10 LAB — CMP (CANCER CENTER ONLY)
ALBUMIN: 3.8 g/dL (ref 3.5–5.0)
ALK PHOS: 60 U/L (ref 38–126)
ALT: 13 U/L (ref 0–44)
ANION GAP: 9 (ref 5–15)
AST: 14 U/L — ABNORMAL LOW (ref 15–41)
BUN: 7 mg/dL (ref 6–20)
CO2: 24 mmol/L (ref 22–32)
Calcium: 8.9 mg/dL (ref 8.9–10.3)
Chloride: 104 mmol/L (ref 98–111)
Creatinine: 0.8 mg/dL (ref 0.44–1.00)
GFR, Est AFR Am: >60 mL/min (ref >60)
GFR, Estimated: >60 mL/min (ref >60)
GLUCOSE: 100 mg/dL — AB (ref 70–99)
Potassium: 3.6 mmol/L (ref 3.5–5.1)
SODIUM: 137 mmol/L (ref 135–145)
Total Bilirubin: 0.8 mg/dL (ref 0.3–1.2)
Total Protein: 7.4 g/dL (ref 6.5–8.1)

## 2018-04-10 LAB — TSH: TSH: 10.36 u[IU]/mL — ABNORMAL HIGH (ref 0.308–3.960)

## 2018-04-10 LAB — CEA (IN HOUSE-CHCC): CEA (CHCC-In House): 3.37 ng/mL (ref 0.00–5.00)

## 2018-04-10 MED ORDER — SODIUM CHLORIDE 0.9 % IV SOLN
200.0000 mg | Freq: Once | INTRAVENOUS | Status: AC
  Administered 2018-04-10: 200 mg via INTRAVENOUS
  Filled 2018-04-10: qty 8

## 2018-04-10 MED ORDER — SODIUM CHLORIDE 0.9% FLUSH
10.0000 mL | Freq: Once | INTRAVENOUS | Status: AC
  Administered 2018-04-10: 10 mL
  Filled 2018-04-10: qty 10

## 2018-04-10 MED ORDER — HEPARIN SOD (PORK) LOCK FLUSH 100 UNIT/ML IV SOLN
500.0000 [IU] | Freq: Once | INTRAVENOUS | Status: AC | PRN
  Administered 2018-04-10: 500 [IU]
  Filled 2018-04-10: qty 5

## 2018-04-10 MED ORDER — SODIUM CHLORIDE 0.9% FLUSH
10.0000 mL | INTRAVENOUS | Status: DC | PRN
  Administered 2018-04-10: 10 mL
  Filled 2018-04-10: qty 10

## 2018-04-10 MED ORDER — LORAZEPAM 0.5 MG PO TABS: 0.5000 mg | ORAL_TABLET | Freq: Three times a day (TID) | ORAL | 0 refills | Status: DC | PRN

## 2018-04-10 MED ORDER — SODIUM CHLORIDE 0.9 % IV SOLN
Freq: Once | INTRAVENOUS | Status: AC
  Administered 2018-04-10: 12:00:00 via INTRAVENOUS
  Filled 2018-04-10: qty 250

## 2018-04-10 NOTE — Patient Instructions (Signed)
[facility] Discharge Instructions for Patients Receiving Chemotherapy  Today you received the following chemotherapy agents :  Keytruda.  To help prevent nausea and vomiting after your treatment, we encourage you to take your nausea medication as prescribed.   If you develop nausea and vomiting that is not controlled by your nausea medication, call the clinic.   BELOW ARE SYMPTOMS THAT SHOULD BE REPORTED IMMEDIATELY:  *FEVER GREATER THAN 100.5 F  *CHILLS WITH OR WITHOUT FEVER  NAUSEA AND VOMITING THAT IS NOT CONTROLLED WITH YOUR NAUSEA MEDICATION  *UNUSUAL SHORTNESS OF BREATH  *UNUSUAL BRUISING OR BLEEDING  TENDERNESS IN MOUTH AND THROAT WITH OR WITHOUT PRESENCE OF ULCERS  *URINARY PROBLEMS  *BOWEL PROBLEMS  UNUSUAL RASH Items with * indicate a potential emergency and should be followed up as soon as possible.  Feel free to call the clinic should you have any questions or concerns. The clinic phone number is [phone].  Please show the CHEMO ALERT CARD at check-in to the Emergency Department and triage nurse.  

## 2018-04-10 NOTE — Progress Notes (Signed)
[facility] OFFICE PROGRESS NOTE   Diagnosis: Colon cancer  INTERVAL HISTORY:   [name] returns as scheduled.  She was last treated with pembrolizumab 03/20/2018.  No rash.  She had diarrhea only when she drank contrast for the CT scan yesterday.  He feels well.  Good appetite.  Objective:  Vital signs in last 24 hours:  Blood pressure 112/82, pulse 87, temperature 98.1 F (36.7 C), temperature source Oral, resp. rate 18, height 5' 2"  (1.575 m), weight 152 lb 1.6 oz (69 kg), SpO2 100 %.    HEENT: No thrush or ulcers Resp: Lungs clear bilaterally Cardio: Regular rate and rhythm GI: No hepatomegaly, no mass, nontender Vascular: Leg edema  Skin: No rash  Portacath/PICC-without erythema  Lab Results:  Lab Results  Component Value Date   WBC 6.6 04/10/2018   HGB 13.6 04/10/2018   HCT 39.9 04/10/2018   MCV 88.9 04/10/2018   PLT 219 04/10/2018   NEUTROABS 4.9 04/10/2018    CMP  Lab Results  Component Value Date   NA 137 03/20/2018   K 3.8 03/20/2018   CL 104 03/20/2018   CO2 26 03/20/2018   GLUCOSE 107 (H) 03/20/2018   BUN 7 03/20/2018   CREATININE 0.77 03/20/2018   CALCIUM 9.2 03/20/2018   PROT 7.8 03/20/2018   ALBUMIN 4.1 03/20/2018   AST 20 03/20/2018   ALT 22 03/20/2018   ALKPHOS 65 03/20/2018   BILITOT 0.7 03/20/2018   GFRNONAA >60 03/20/2018   GFRAA >60 03/20/2018    Lab Results  Component Value Date   CEA1 2.57 12/05/2017     Imaging:  Ct Abdomen Pelvis W Contrast  Result Date: 04/10/2018 CLINICAL DATA:  Stage IV colon resection status post colon resection EXAM: CT ABDOMEN AND PELVIS WITH CONTRAST TECHNIQUE: Multidetector CT imaging of the abdomen and pelvis was performed using the standard protocol following bolus administration of intravenous contrast. CONTRAST:  178m OMNIPAQUE IOHEXOL 300 MG/ML  SOLN COMPARISON:  09/29/2017 FINDINGS: Lower chest: Lung bases are clear. Hepatobiliary: Liver is within normal limits.  Gallbladder is unremarkable. No intrahepatic or extrahepatic ductal dilatation. Pancreas: Within normal limits. Spleen: Within normal limits. Adrenals/Urinary Tract: Adrenal glands are within normal limits. Mild left renal cortical thinning. Right kidney is within normal limits. No hydronephrosis. Bladder is within normal limits. Stomach/Bowel: Stomach is within normal limits. No evidence of bowel obstruction. Status post right hemicolectomy with appendectomy. Vascular/Lymphatic: No evidence of abdominal aortic aneurysm. No suspicious abdominopelvic lymphadenopathy. Reproductive: Uterus is within normal limits. Left ovarian follicle, physiologic. Status post right oophorectomy. Other: No abdominopelvic ascites. No gross peritoneal nodularity. Mild stranding beneath the midline anterior abdominal wall (series 2/image 35). Postsurgical changes along the midline anterior abdominal wall. Associated 7 x 12 mm subcutaneous lesion (series 2/image 59), unchanged, favoring postsurgical scarring/granulation tissue. Musculoskeletal: Visualized osseous structures are within normal limits. IMPRESSION: Status post right hemicolectomy and right oophorectomy. No evidence of recurrent or metastatic disease. Stable subcutaneous lesion along the midline anterior abdominal wall, favoring postsurgical change/scarring. Electronically Signed   By: SJulian HyM.D.   On: 04/10/2018 10:42    Medications: I have reviewed the patient's current medications.   Assessment/Plan:  1. Stage IV Colon cancer, cecum, presenting with a palpable low abdominal/pelvic mass ? Colonoscopy 03/31/2017 confirmed a cecum mass, biopsy positive for poorly differentiated adenocarcinoma ? CT abdomen/pelvis 03/29/2017-large necrotic right iliac fossa mass, enlarged right common iliac nodes, loculated pelvic ascites, right lower quadrant mesenteric masses ? Right colectomy/right oophorectomy-salpingectomy 04/04/2017 ? Pathology  from the right colon  resection, grade 3 adenocarcinoma of the cecum,pT4b,pN2b ? Loss of MSH6 expression ? PET scan 04/29/2017-residual tumor at the left adnexa, and internal iliac lymph node, and aortocaval node ? Cycle 1 Pembrolizumab09/07/2017 ? Cycle 2 Pembrolizumab 05/30/2017 ? Cycle 3 Pembrolizumab 06/20/2017 ? Cycle4 Pembrolizumab 07/11/2017 ? Cycle 5 Pembrolizumab 08/04/2017 ? Cycle 6 pembrolizumab 08/22/2017 ? Cycle 7 pembrolizumab 09/12/2017 ? CTs 09/29/2017-previously noted retroperitoneal and right lower quadrant adenopathy has resolved, necrotic pelvic mass not visualized, single enhancing nodule at the umbilicus is nonspecific ? Cycle 8 pembrolizumab 10/03/2017 ? Cycle 9 Pembrolizumab 10/24/2017 ? Cycle 10 Pembrolizumab 11/14/2017 ? Cycle 11 pembrolizumab 12/05/2017 ? Cycle 12 pembrolizumab 12/26/2017 ? Cycle 13 Pembrolizumab 01/16/2018 ? Cycle 14 pembrolizumab 02/06/2018 ? Cycle 15 Pembrolizumab 02/27/2018 ? Cycle 16 Pembrolizumab 03/20/2018 ? CT 04/10/2018-no evidence of recurrent malignancy, stable subcutaneous lesion at the anterior abdominal wall ? Cycle 17 pembrolizumab 04/10/2018  2. 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 Anemia-likely iron deficiency anemia secondary to #1.Resolved.  4.G3 P3  5. Appendectomy  5. Maternal aunt with colon cancer  6.Hypothyroid-likely secondary to toxicity from pembrolizumab, thyroid hormone replacement started 08/22/2017  7.Port-A-Cath placement 11/28/2017   Disposition: [name] appears unchanged.  The restaging CT shows no evidence of metastatic disease.  The plan is to continue every 3-week pembrolizumab.  We will follow-up on the TSH from today.  She will return for an office visit and pembrolizumab in 3 weeks.  I reviewed the CT images with her.  15 minutes were spent with the patient today.  The majority of the time was used for counseling and coordination of care.  Betsy Coder,  MD  04/10/2018  11:01 AM

## 2018-04-10 NOTE — Telephone Encounter (Signed)
Appts scheduled AVS/Calendar printed per 8/9 los

## 2018-04-13 ENCOUNTER — Telehealth: Payer: Self-pay

## 2018-04-13 MED ORDER — LEVOTHYROXINE SODIUM 100 MCG PO TABS: 100.0000 ug | ORAL_TABLET | Freq: Every day | ORAL | 0 refills | Status: DC

## 2018-04-13 NOTE — Telephone Encounter (Addendum)
Pt voiced understanding of messgae below ----- Message from Ladell Pier, MD sent at 04/10/2018  4:46 PM EDT ----- Please call patient, increase synthroid dose to 0.1mg  daily if she has been taking the synthroid on schedule, repeat TSH at 6 week treatment

## 2018-04-23 ENCOUNTER — Encounter: Payer: Self-pay | Admitting: Pharmacy Technician

## 2018-04-23 NOTE — Progress Notes (Signed)
The patient has been re enrolled by [employer] for [employer] until 06/22/19. Enrollment is based on self pay. Drug request will continue until enrollment or treatment ends.

## 2018-04-26 ENCOUNTER — Other Ambulatory Visit: Payer: Self-pay | Admitting: Oncology

## 2018-05-01 ENCOUNTER — Encounter: Payer: Self-pay | Admitting: Nurse Practitioner

## 2018-05-01 ENCOUNTER — Telehealth: Payer: Self-pay | Admitting: Nurse Practitioner

## 2018-05-01 ENCOUNTER — Inpatient Hospital Stay (HOSPITAL_BASED_OUTPATIENT_CLINIC_OR_DEPARTMENT_OTHER): Payer: Self-pay | Admitting: Nurse Practitioner

## 2018-05-01 ENCOUNTER — Inpatient Hospital Stay: Payer: Self-pay

## 2018-05-01 VITALS — BP 115/77 | HR 80 | Temp 98.7°F | Resp 18 | Ht 62.0 in | Wt 155.4 lb

## 2018-05-01 DIAGNOSIS — C189 Malignant neoplasm of colon, unspecified: Secondary | ICD-10-CM

## 2018-05-01 DIAGNOSIS — Z95828 Presence of other vascular implants and grafts: Secondary | ICD-10-CM

## 2018-05-01 DIAGNOSIS — C180 Malignant neoplasm of cecum: Secondary | ICD-10-CM

## 2018-05-01 LAB — CMP (CANCER CENTER ONLY)
ALBUMIN: 3.7 g/dL (ref 3.5–5.0)
ALK PHOS: 52 U/L (ref 38–126)
ALT: 11 U/L (ref 0–44)
AST: 15 U/L (ref 15–41)
Anion gap: 7 (ref 5–15)
BILIRUBIN TOTAL: 0.5 mg/dL (ref 0.3–1.2)
BUN: 7 mg/dL (ref 6–20)
CO2: 26 mmol/L (ref 22–32)
Calcium: 9.1 mg/dL (ref 8.9–10.3)
Chloride: 106 mmol/L (ref 98–111)
Creatinine: 0.74 mg/dL (ref 0.44–1.00)
GFR, Est AFR Am: >60 mL/min (ref >60)
GFR, Estimated: >60 mL/min (ref >60)
GLUCOSE: 75 mg/dL (ref 70–99)
Potassium: 3.8 mmol/L (ref 3.5–5.1)
SODIUM: 139 mmol/L (ref 135–145)
TOTAL PROTEIN: 7.1 g/dL (ref 6.5–8.1)

## 2018-05-01 LAB — CBC WITH DIFFERENTIAL (CANCER CENTER ONLY)
BASOS ABS: 0 10*3/uL (ref 0.0–0.1)
Basophils Relative: 0 %
EOS PCT: 1 %
Eosinophils Absolute: 0.1 10*3/uL (ref 0.0–0.5)
HEMATOCRIT: 37.6 % (ref 34.8–46.6)
Hemoglobin: 12.7 g/dL (ref 11.6–15.9)
LYMPHS PCT: 19 %
Lymphs Abs: 1.4 10*3/uL (ref 0.9–3.3)
MCH: 30.2 pg (ref 25.1–34.0)
MCHC: 33.8 g/dL (ref 31.5–36.0)
MCV: 89.5 fL (ref 79.5–101.0)
MONO ABS: 0.5 10*3/uL (ref 0.1–0.9)
MONOS PCT: 7 %
Neutro Abs: 5.1 10*3/uL (ref 1.5–6.5)
Neutrophils Relative %: 73 %
Platelet Count: 210 10*3/uL (ref 145–400)
RBC: 4.2 MIL/uL (ref 3.70–5.45)
RDW: 13.3 % (ref 11.2–14.5)
WBC Count: 7.1 10*3/uL (ref 3.9–10.3)

## 2018-05-01 LAB — TSH: TSH: 1.141 u[IU]/mL (ref 0.308–3.960)

## 2018-05-01 MED ORDER — SODIUM CHLORIDE 0.9% FLUSH
10.0000 mL | INTRAVENOUS | Status: DC | PRN
  Administered 2018-05-01: 10 mL
  Filled 2018-05-01: qty 10

## 2018-05-01 MED ORDER — SODIUM CHLORIDE 0.9% FLUSH
10.0000 mL | Freq: Once | INTRAVENOUS | Status: AC
  Administered 2018-05-01: 10 mL
  Filled 2018-05-01: qty 10

## 2018-05-01 MED ORDER — HEPARIN SOD (PORK) LOCK FLUSH 100 UNIT/ML IV SOLN
500.0000 [IU] | Freq: Once | INTRAVENOUS | Status: AC | PRN
  Administered 2018-05-01: 500 [IU]
  Filled 2018-05-01: qty 5

## 2018-05-01 MED ORDER — SODIUM CHLORIDE 0.9 % IV SOLN
200.0000 mg | Freq: Once | INTRAVENOUS | Status: AC
  Administered 2018-05-01: 200 mg via INTRAVENOUS
  Filled 2018-05-01: qty 8

## 2018-05-01 MED ORDER — SODIUM CHLORIDE 0.9 % IV SOLN
Freq: Once | INTRAVENOUS | Status: AC
  Administered 2018-05-01: 12:00:00 via INTRAVENOUS
  Filled 2018-05-01: qty 250

## 2018-05-01 NOTE — Patient Instructions (Signed)
[facility] Discharge Instructions for Patients Receiving Chemotherapy  Today you received the following chemotherapy agents keytruda   To help prevent nausea and vomiting after your treatment, we encourage you to take your nausea medication as directed  If you develop nausea and vomiting that is not controlled by your nausea medication, call the clinic.   BELOW ARE SYMPTOMS THAT SHOULD BE REPORTED IMMEDIATELY:  *FEVER GREATER THAN 100.5 F  *CHILLS WITH OR WITHOUT FEVER  NAUSEA AND VOMITING THAT IS NOT CONTROLLED WITH YOUR NAUSEA MEDICATION  *UNUSUAL SHORTNESS OF BREATH  *UNUSUAL BRUISING OR BLEEDING  TENDERNESS IN MOUTH AND THROAT WITH OR WITHOUT PRESENCE OF ULCERS  *URINARY PROBLEMS  *BOWEL PROBLEMS  UNUSUAL RASH Items with * indicate a potential emergency and should be followed up as soon as possible.  Feel free to call the clinic you have any questions or concerns. The clinic phone number is [phone].  

## 2018-05-01 NOTE — Telephone Encounter (Signed)
No 8/30 los 

## 2018-05-01 NOTE — Progress Notes (Signed)
  [facility] OFFICE PROGRESS NOTE   Diagnosis: Colon cancer  INTERVAL HISTORY:   Meagan [name] returns as scheduled.  She continues every 3-week Pembrolizumab.  She feels well.  No nausea or vomiting.  No mouth sores.  No diarrhea.  No rash.  No fever, cough, shortness of breath.  She has a good appetite.  No pain.  Objective:  Vital signs in last 24 hours:  Blood pressure 115/77, pulse 80, temperature 98.7 F (37.1 C), temperature source Oral, resp. rate 18, height '5\' 2"'$  (1.575 m), weight 155 lb 6.4 oz (70.5 kg), SpO2 100 %.    HEENT: No thrush or ulcers. Resp: Lungs clear bilaterally. Cardio: Regular rate and rhythm. GI: Abdomen soft and nontender.  No hepatomegaly. Vascular: No leg edema. Neuro: Alert and oriented. Skin: No rash. Port-A-Cath without erythema.   Lab Results:  Lab Results  Component Value Date   WBC 7.1 05/01/2018   HGB 12.7 05/01/2018   HCT 37.6 05/01/2018   MCV 89.5 05/01/2018   PLT 210 05/01/2018   NEUTROABS 5.1 05/01/2018    Imaging:  No results found.  Medications: I have reviewed the patient's current medications.  Assessment/Plan: 1. Stage IV Colon cancer, cecum, presenting with a palpable low abdominal/pelvic mass ? Colonoscopy 03/31/2017 confirmed a cecum mass, biopsy positive for poorly differentiated adenocarcinoma ? CT abdomen/pelvis 03/29/2017-large necrotic right iliac fossa mass, enlarged right common iliac nodes, loculated pelvic ascites, right lower quadrant mesenteric masses ? Right colectomy/right oophorectomy-salpingectomy 04/04/2017 ? Pathology from the right colon resection, grade 3 adenocarcinoma of the cecum,pT4b,pN2b ? Loss of MSH6 expression ? PET scan 04/29/2017-residual tumor at the left adnexa, and internal iliac lymph node, and aortocaval node ? Cycle 1 Pembrolizumab09/07/2017 ? Cycle 2 Pembrolizumab 05/30/2017 ? Cycle 3 Pembrolizumab 06/20/2017 ? Cycle4 Pembrolizumab 07/11/2017 ? Cycle 5  Pembrolizumab 08/04/2017 ? Cycle 6 pembrolizumab 08/22/2017 ? Cycle 7 pembrolizumab 09/12/2017 ? CTs 09/29/2017-previously noted retroperitoneal and right lower quadrant adenopathy has resolved, necrotic pelvic mass not visualized, single enhancing nodule at the umbilicus is nonspecific ? Cycle 8 pembrolizumab 10/03/2017 ? Cycle 9 Pembrolizumab 10/24/2017 ? Cycle 10 Pembrolizumab 11/14/2017 ? Cycle 11 pembrolizumab 12/05/2017 ? Cycle 12 pembrolizumab 12/26/2017 ? Cycle 13 Pembrolizumab 01/16/2018 ? Cycle 14 pembrolizumab 02/06/2018 ? Cycle 15 Pembrolizumab 02/27/2018 ? Cycle 16 Pembrolizumab 03/20/2018 ? CT 04/10/2018-no evidence of recurrent malignancy, stable subcutaneous lesion at the anterior abdominal wall ? Cycle 17 pembrolizumab 04/10/2018 ? Cycle 18 Pembrolizumab 05/01/2018  2. 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 Anemia-likely iron deficiency anemia secondary to #1.Resolved.  4.G3 P3  5. Appendectomy  5. Maternal aunt with colon cancer  6.Hypothyroid-likely secondary to toxicity from pembrolizumab, thyroid hormone replacement started 08/22/2017  7.Port-A-Cath placement 11/28/2017  Disposition: Meagan [name] appears stable.  She has completed 17 cycles of Pembrolizumab.  There is no clinical evidence of disease progression.  Plan to proceed with cycle 18 today as scheduled.  She will return for lab, follow-up and the next cycle of Pembrolizumab in 3 weeks.  She will contact the office in the interim with any problems.    Ned Card ANP/GNP-BC   05/01/2018  10:58 AM

## 2018-05-05 ENCOUNTER — Other Ambulatory Visit: Payer: Self-pay | Admitting: Nurse Practitioner

## 2018-05-05 DIAGNOSIS — C189 Malignant neoplasm of colon, unspecified: Secondary | ICD-10-CM

## 2018-05-12 ENCOUNTER — Other Ambulatory Visit: Payer: Self-pay | Admitting: Oncology

## 2018-05-17 ENCOUNTER — Other Ambulatory Visit: Payer: Self-pay | Admitting: Oncology

## 2018-05-22 ENCOUNTER — Inpatient Hospital Stay: Payer: Medicaid Other

## 2018-05-22 ENCOUNTER — Inpatient Hospital Stay: Payer: Self-pay | Attending: Oncology | Admitting: Nurse Practitioner

## 2018-05-22 ENCOUNTER — Telehealth: Payer: Self-pay | Admitting: Nurse Practitioner

## 2018-05-22 ENCOUNTER — Inpatient Hospital Stay: Payer: Self-pay

## 2018-05-22 ENCOUNTER — Encounter: Payer: Self-pay | Admitting: Nurse Practitioner

## 2018-05-22 VITALS — BP 117/73 | HR 80 | Temp 98.3°F | Resp 18 | Ht 62.0 in | Wt 155.5 lb

## 2018-05-22 DIAGNOSIS — Z23 Encounter for immunization: Secondary | ICD-10-CM

## 2018-05-22 DIAGNOSIS — Z95828 Presence of other vascular implants and grafts: Secondary | ICD-10-CM

## 2018-05-22 DIAGNOSIS — E039 Hypothyroidism, unspecified: Secondary | ICD-10-CM | POA: Insufficient documentation

## 2018-05-22 DIAGNOSIS — Z79899 Other long term (current) drug therapy: Secondary | ICD-10-CM | POA: Insufficient documentation

## 2018-05-22 DIAGNOSIS — Z5112 Encounter for antineoplastic immunotherapy: Secondary | ICD-10-CM | POA: Insufficient documentation

## 2018-05-22 DIAGNOSIS — C180 Malignant neoplasm of cecum: Secondary | ICD-10-CM | POA: Insufficient documentation

## 2018-05-22 DIAGNOSIS — C189 Malignant neoplasm of colon, unspecified: Secondary | ICD-10-CM

## 2018-05-22 LAB — CMP (CANCER CENTER ONLY)
ALBUMIN: 3.8 g/dL (ref 3.5–5.0)
ALT: 9 U/L (ref 0–44)
ANION GAP: 7 (ref 5–15)
AST: 12 U/L — ABNORMAL LOW (ref 15–41)
Alkaline Phosphatase: 61 U/L (ref 38–126)
BUN: 9 mg/dL (ref 6–20)
CO2: 25 mmol/L (ref 22–32)
Calcium: 9.1 mg/dL (ref 8.9–10.3)
Chloride: 106 mmol/L (ref 98–111)
Creatinine: 0.81 mg/dL (ref 0.44–1.00)
GFR, Estimated: >60 mL/min (ref >60)
GLUCOSE: 129 mg/dL — AB (ref 70–99)
Potassium: 3.6 mmol/L (ref 3.5–5.1)
Sodium: 138 mmol/L (ref 135–145)
Total Bilirubin: 0.5 mg/dL (ref 0.3–1.2)
Total Protein: 7.4 g/dL (ref 6.5–8.1)

## 2018-05-22 LAB — CBC WITH DIFFERENTIAL (CANCER CENTER ONLY)
BASOS PCT: 0 %
Basophils Absolute: 0 10*3/uL (ref 0.0–0.1)
EOS ABS: 0.1 10*3/uL (ref 0.0–0.5)
EOS PCT: 2 %
HCT: 39.5 % (ref 34.8–46.6)
HEMOGLOBIN: 13.4 g/dL (ref 11.6–15.9)
Lymphocytes Relative: 21 %
Lymphs Abs: 1.4 10*3/uL (ref 0.9–3.3)
MCH: 30.7 pg (ref 25.1–34.0)
MCHC: 33.9 g/dL (ref 31.5–36.0)
MCV: 90.4 fL (ref 79.5–101.0)
MONOS PCT: 9 %
Monocytes Absolute: 0.6 10*3/uL (ref 0.1–0.9)
NEUTROS PCT: 68 %
Neutro Abs: 4.6 10*3/uL (ref 1.5–6.5)
PLATELETS: 206 10*3/uL (ref 145–400)
RBC: 4.37 MIL/uL (ref 3.70–5.45)
RDW: 12.7 % (ref 11.2–14.5)
WBC Count: 6.8 10*3/uL (ref 3.9–10.3)

## 2018-05-22 LAB — TSH: TSH: 0.698 u[IU]/mL (ref 0.308–3.960)

## 2018-05-22 MED ORDER — SODIUM CHLORIDE 0.9 % IV SOLN
Freq: Once | INTRAVENOUS | Status: AC
  Administered 2018-05-22: 13:00:00 via INTRAVENOUS
  Filled 2018-05-22: qty 250

## 2018-05-22 MED ORDER — HEPARIN SOD (PORK) LOCK FLUSH 100 UNIT/ML IV SOLN
500.0000 [IU] | Freq: Once | INTRAVENOUS | Status: AC | PRN
  Administered 2018-05-22: 500 [IU]
  Filled 2018-05-22: qty 5

## 2018-05-22 MED ORDER — SODIUM CHLORIDE 0.9% FLUSH
10.0000 mL | Freq: Once | INTRAVENOUS | Status: AC
  Administered 2018-05-22: 10 mL
  Filled 2018-05-22: qty 10

## 2018-05-22 MED ORDER — INFLUENZA VAC SPLIT QUAD 0.5 ML IM SUSY
PREFILLED_SYRINGE | INTRAMUSCULAR | Status: AC
  Filled 2018-05-22: qty 0.5

## 2018-05-22 MED ORDER — SODIUM CHLORIDE 0.9 % IV SOLN
200.0000 mg | Freq: Once | INTRAVENOUS | Status: AC
  Administered 2018-05-22: 200 mg via INTRAVENOUS
  Filled 2018-05-22: qty 8

## 2018-05-22 MED ORDER — SODIUM CHLORIDE 0.9% FLUSH
10.0000 mL | INTRAVENOUS | Status: DC | PRN
  Administered 2018-05-22: 10 mL
  Filled 2018-05-22: qty 10

## 2018-05-22 MED ORDER — INFLUENZA VAC SPLIT QUAD 0.5 ML IM SUSY
0.5000 mL | PREFILLED_SYRINGE | Freq: Once | INTRAMUSCULAR | Status: AC
  Administered 2018-05-22: 0.5 mL via INTRAMUSCULAR

## 2018-05-22 NOTE — Progress Notes (Signed)
  [facility] OFFICE PROGRESS NOTE   Diagnosis: Colon cancer  INTERVAL HISTORY:   Ms. [place] returns as scheduled.  She continues every 3-week Pembrolizumab.  She feels well.  She denies nausea/vomiting.  No mouth sores.  No diarrhea.  No rash.  She denies pain.  No fever, cough or shortness of breath.  She has a good appetite.  Objective:  Vital signs in last 24 hours:  Blood pressure 117/73, pulse 80, temperature 98.3 F (36.8 C), temperature source Oral, resp. rate 18, height '5\' 2"'$  (1.575 m), weight 155 lb 8 oz (70.5 kg), SpO2 100 %.    HEENT: No thrush or ulcers. Resp: Lungs clear bilaterally. Cardio: Regular rate and rhythm. GI: Abdomen soft and nontender.  No hepatomegaly. Vascular: No leg edema.  Skin: No rash. Port-A-Cath without erythema.   Lab Results:  Lab Results  Component Value Date   WBC 7.1 05/01/2018   HGB 12.7 05/01/2018   HCT 37.6 05/01/2018   MCV 89.5 05/01/2018   PLT 210 05/01/2018   NEUTROABS 5.1 05/01/2018    Imaging:  No results found.  Medications: I have reviewed the patient's current medications.  Assessment/Plan: 1. Stage IV Colon cancer, cecum, presenting with a palpable low abdominal/pelvic mass ? Colonoscopy 03/31/2017 confirmed a cecum mass, biopsy positive for poorly differentiated adenocarcinoma ? CT abdomen/pelvis 03/29/2017-large necrotic right iliac fossa mass, enlarged right common iliac nodes, loculated pelvic ascites, right lower quadrant mesenteric masses ? Right colectomy/right oophorectomy-salpingectomy 04/04/2017 ? Pathology from the right colon resection, grade 3 adenocarcinoma of the cecum,pT4b,pN2b ? Loss of MSH6 expression ? PET scan 04/29/2017-residual tumor at the left adnexa, and internal iliac lymph node, and aortocaval node ? Cycle 1 Pembrolizumab09/07/2017 ? Cycle 2 Pembrolizumab 05/30/2017 ? Cycle 3 Pembrolizumab 06/20/2017 ? Cycle4 Pembrolizumab 07/11/2017 ? Cycle 5 Pembrolizumab  08/04/2017 ? Cycle 6 pembrolizumab 08/22/2017 ? Cycle 7 pembrolizumab 09/12/2017 ? CTs 09/29/2017-previously noted retroperitoneal and right lower quadrant adenopathy has resolved, necrotic pelvic mass not visualized, single enhancing nodule at the umbilicus is nonspecific ? Cycle 8 pembrolizumab 10/03/2017 ? Cycle 9 Pembrolizumab 10/24/2017 ? Cycle 10 Pembrolizumab 11/14/2017 ? Cycle 11 pembrolizumab 12/05/2017 ? Cycle 12 pembrolizumab 12/26/2017 ? Cycle 13 Pembrolizumab 01/16/2018 ? Cycle 14 pembrolizumab 02/06/2018 ? Cycle 15 Pembrolizumab 02/27/2018 ? Cycle 16 Pembrolizumab 03/20/2018 ? CT 04/10/2018-no evidence of recurrent malignancy, stable subcutaneous lesion at the anterior abdominal wall ? Cycle 17 pembrolizumab 04/10/2018 ? Cycle 18 Pembrolizumab 05/01/2018 ? Cycle 19 Pembrolizumab 05/22/2018  2. 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 Anemia-likely iron deficiency anemia secondary to #1.Resolved.  4.G3 P3  5. Appendectomy  5. Maternal aunt with colon cancer  6.Hypothyroid-likely secondary to toxicity from pembrolizumab, thyroid hormone replacement started 08/22/2017  7.Port-A-Cath placement 11/28/2017   Disposition: Ms. Rossa appears well.  She has completed 18 cycles of Pembrolizumab.  There is no clinical evidence of disease progression.  Plan to proceed with cycle 19 today as scheduled.  We reviewed the CBC and chemistry panel from today.   She will return for lab, follow-up and the next cycle of Pembrolizumab in 3 weeks.  She will contact the office in the interim with any problems.    Ned Card ANP/GNP-BC   05/22/2018  12:02 PM

## 2018-05-22 NOTE — Telephone Encounter (Signed)
Scheduled appt per 9/20 los - gave patient AVS and calender per los.   

## 2018-05-22 NOTE — Patient Instructions (Signed)
[facility] Discharge Instructions for Patients Receiving Chemotherapy  Today you received the following chemotherapy agents:  Keytruda.  To help prevent nausea and vomiting after your treatment, we encourage you to take your nausea medication as directed.   If you develop nausea and vomiting that is not controlled by your nausea medication, call the clinic.   BELOW ARE SYMPTOMS THAT SHOULD BE REPORTED IMMEDIATELY:  *FEVER GREATER THAN 100.5 F  *CHILLS WITH OR WITHOUT FEVER  NAUSEA AND VOMITING THAT IS NOT CONTROLLED WITH YOUR NAUSEA MEDICATION  *UNUSUAL SHORTNESS OF BREATH  *UNUSUAL BRUISING OR BLEEDING  TENDERNESS IN MOUTH AND THROAT WITH OR WITHOUT PRESENCE OF ULCERS  *URINARY PROBLEMS  *BOWEL PROBLEMS  UNUSUAL RASH Items with * indicate a potential emergency and should be followed up as soon as possible.  Feel free to call the clinic should you have any questions or concerns. The clinic phone number is [phone].  Please show the CHEMO ALERT CARD at check-in to the Emergency Department and triage nurse.    

## 2018-05-23 LAB — T4: T4 TOTAL: 9.6 ug/dL (ref 4.5–12.0)

## 2018-05-27 ENCOUNTER — Telehealth: Payer: Self-pay | Admitting: Oncology

## 2018-05-27 NOTE — Telephone Encounter (Signed)
[facility] PAL 10/11 - f/u moved to LT remaining appointments adjusted. Spoke with patient.

## 2018-06-04 ENCOUNTER — Telehealth: Payer: Self-pay | Admitting: Oncology

## 2018-06-04 NOTE — Telephone Encounter (Signed)
LT PAL 11/1 - moved f/u to [facility] and adjusted other appointments accordingly. Left message for patient and patient to get updated schedule at next visit 10/11.

## 2018-06-06 ENCOUNTER — Other Ambulatory Visit: Payer: Self-pay | Admitting: Oncology

## 2018-06-12 ENCOUNTER — Inpatient Hospital Stay: Payer: Self-pay

## 2018-06-12 ENCOUNTER — Encounter: Payer: Self-pay | Admitting: Nurse Practitioner

## 2018-06-12 ENCOUNTER — Inpatient Hospital Stay (HOSPITAL_BASED_OUTPATIENT_CLINIC_OR_DEPARTMENT_OTHER): Payer: Self-pay | Admitting: Nurse Practitioner

## 2018-06-12 ENCOUNTER — Inpatient Hospital Stay: Payer: Self-pay | Attending: Oncology

## 2018-06-12 VITALS — BP 111/73 | HR 78 | Temp 98.2°F | Resp 18 | Ht 62.0 in | Wt 155.3 lb

## 2018-06-12 DIAGNOSIS — C189 Malignant neoplasm of colon, unspecified: Secondary | ICD-10-CM

## 2018-06-12 DIAGNOSIS — Z5112 Encounter for antineoplastic immunotherapy: Secondary | ICD-10-CM | POA: Insufficient documentation

## 2018-06-12 DIAGNOSIS — C180 Malignant neoplasm of cecum: Secondary | ICD-10-CM

## 2018-06-12 DIAGNOSIS — Z95828 Presence of other vascular implants and grafts: Secondary | ICD-10-CM

## 2018-06-12 LAB — CMP (CANCER CENTER ONLY)
ALT: 12 U/L (ref 0–44)
ANION GAP: 9 (ref 5–15)
AST: 15 U/L (ref 15–41)
Albumin: 3.7 g/dL (ref 3.5–5.0)
Alkaline Phosphatase: 54 U/L (ref 38–126)
BUN: 6 mg/dL (ref 6–20)
CHLORIDE: 104 mmol/L (ref 98–111)
CO2: 25 mmol/L (ref 22–32)
Calcium: 9.2 mg/dL (ref 8.9–10.3)
Creatinine: 0.72 mg/dL (ref 0.44–1.00)
Glucose, Bld: 89 mg/dL (ref 70–99)
POTASSIUM: 3.9 mmol/L (ref 3.5–5.1)
Sodium: 138 mmol/L (ref 135–145)
TOTAL PROTEIN: 7.5 g/dL (ref 6.5–8.1)
Total Bilirubin: 0.4 mg/dL (ref 0.3–1.2)

## 2018-06-12 LAB — CBC WITH DIFFERENTIAL (CANCER CENTER ONLY)
Abs Immature Granulocytes: 0.02 10*3/uL (ref 0.00–0.07)
BASOS PCT: 0 %
Basophils Absolute: 0 10*3/uL (ref 0.0–0.1)
EOS ABS: 0.1 10*3/uL (ref 0.0–0.5)
EOS PCT: 2 %
HCT: 38.3 % (ref 36.0–46.0)
Hemoglobin: 13.4 g/dL (ref 12.0–15.0)
Immature Granulocytes: 0 %
Lymphocytes Relative: 25 %
Lymphs Abs: 1.6 10*3/uL (ref 0.7–4.0)
MCH: 31.1 pg (ref 26.0–34.0)
MCHC: 35 g/dL (ref 30.0–36.0)
MCV: 88.9 fL (ref 80.0–100.0)
MONOS PCT: 8 %
Monocytes Absolute: 0.5 10*3/uL (ref 0.1–1.0)
Neutro Abs: 4 10*3/uL (ref 1.7–7.7)
Neutrophils Relative %: 65 %
PLATELETS: 225 10*3/uL (ref 150–400)
RBC: 4.31 MIL/uL (ref 3.87–5.11)
RDW: 12.2 % (ref 11.5–15.5)
WBC: 6.2 10*3/uL (ref 4.0–10.5)
nRBC: 0 % (ref 0.0–0.2)

## 2018-06-12 LAB — CEA (IN HOUSE-CHCC): CEA (CHCC-IN HOUSE): 2.82 ng/mL (ref 0.00–5.00)

## 2018-06-12 MED ORDER — SODIUM CHLORIDE 0.9% FLUSH
10.0000 mL | INTRAVENOUS | Status: DC | PRN
  Administered 2018-06-12: 10 mL
  Filled 2018-06-12: qty 10

## 2018-06-12 MED ORDER — SODIUM CHLORIDE 0.9 % IV SOLN
Freq: Once | INTRAVENOUS | Status: AC
  Administered 2018-06-12: 12:00:00 via INTRAVENOUS
  Filled 2018-06-12: qty 250

## 2018-06-12 MED ORDER — SODIUM CHLORIDE 0.9% FLUSH
10.0000 mL | Freq: Once | INTRAVENOUS | Status: AC
  Administered 2018-06-12: 10 mL
  Filled 2018-06-12: qty 10

## 2018-06-12 MED ORDER — SODIUM CHLORIDE 0.9 % IV SOLN
200.0000 mg | Freq: Once | INTRAVENOUS | Status: AC
  Administered 2018-06-12: 200 mg via INTRAVENOUS
  Filled 2018-06-12: qty 8

## 2018-06-12 MED ORDER — LEVOTHYROXINE SODIUM 100 MCG PO TABS: ORAL_TABLET | ORAL | 0 refills | Status: DC

## 2018-06-12 MED ORDER — HEPARIN SOD (PORK) LOCK FLUSH 100 UNIT/ML IV SOLN
500.0000 [IU] | Freq: Once | INTRAVENOUS | Status: AC | PRN
  Administered 2018-06-12: 500 [IU]
  Filled 2018-06-12: qty 5

## 2018-06-12 NOTE — Progress Notes (Signed)
  [facility] OFFICE PROGRESS NOTE   Diagnosis:  Colon cancer  INTERVAL HISTORY:   [name] returns as scheduled. She continues every 3-week Pembrolizumab.  She feels well.  No nausea or vomiting.  No mouth sores.  No diarrhea.  No rash.  She denies pain.  She has a good appetite.  No fever, cough, shortness of breath.  Objective:  Vital signs in last 24 hours:  Blood pressure 111/73, pulse 78, temperature 98.2 F (36.8 C), temperature source Oral, resp. rate 18, height '5\' 2"'$  (1.575 m), weight 155 lb 4.8 oz (70.4 kg), SpO2 100 %.    HEENT: No thrush or ulcers. Resp: Lungs clear bilaterally. Cardio: Regular rate and rhythm. GI: Abdomen soft and nontender.  No hepatomegaly. Vascular: No leg edema. Neuro: Alert and oriented. Skin: No rash. Port-A-Cath without erythema.   Lab Results:  Lab Results  Component Value Date   WBC 6.2 06/12/2018   HGB 13.4 06/12/2018   HCT 38.3 06/12/2018   MCV 88.9 06/12/2018   PLT 225 06/12/2018   NEUTROABS 4.0 06/12/2018    Imaging:  No results found.  Medications: I have reviewed the patient's current medications.  Assessment/Plan: 1. Stage IV Colon cancer, cecum, presenting with a palpable low abdominal/pelvic mass ? Colonoscopy 03/31/2017 confirmed a cecum mass, biopsy positive for poorly differentiated adenocarcinoma ? CT abdomen/pelvis 03/29/2017-large necrotic right iliac fossa mass, enlarged right common iliac nodes, loculated pelvic ascites, right lower quadrant mesenteric masses ? Right colectomy/right oophorectomy-salpingectomy 04/04/2017 ? Pathology from the right colon resection, grade 3 adenocarcinoma of the cecum, pT4b,pN2b ? Loss of MSH6 expression ? PET scan 04/29/2017-residual tumor at the left adnexa, and internal iliac lymph node, and aortocaval node ? Cycle 1 Pembrolizumab09/07/2017 ? Cycle 2 Pembrolizumab 05/30/2017 ? Cycle 3 Pembrolizumab 06/20/2017 ? Cycle4 Pembrolizumab 07/11/2017 ? Cycle 5  Pembrolizumab 08/04/2017 ? Cycle 6 pembrolizumab 08/22/2017 ? Cycle 7 pembrolizumab 09/12/2017 ? CTs 09/29/2017-previously noted retroperitoneal and right lower quadrant adenopathy has resolved, necrotic pelvic mass not visualized, single enhancing nodule at the umbilicus is nonspecific ? Cycle 8 pembrolizumab 10/03/2017 ? Cycle 9 Pembrolizumab 10/24/2017 ? Cycle 10 Pembrolizumab 11/14/2017 ? Cycle 11 pembrolizumab 12/05/2017 ? Cycle 12 pembrolizumab 12/26/2017 ? Cycle 13 Pembrolizumab 01/16/2018 ? Cycle 14 pembrolizumab 02/06/2018 ? Cycle 15 Pembrolizumab 02/27/2018 ? Cycle 16 Pembrolizumab 03/20/2018 ? CT 04/10/2018-no evidence of recurrent malignancy, stable subcutaneous lesion at the anterior abdominal wall ? Cycle 17 pembrolizumab 04/10/2018 ? Cycle 18 Pembrolizumab 05/01/2018 ? Cycle 19 Pembrolizumab 05/22/2018 ? Cycle 20 Pembrolizumab 06/12/2018  2. 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 Anemia-likely iron deficiency anemia secondary to #1.Resolved.  4.G3 P3  5. Appendectomy  5. Maternal aunt with colon cancer  6.Hypothyroid-likely secondary to toxicity from pembrolizumab, thyroid hormone replacement started 08/22/2017  7.Port-A-Cath placement 11/28/2017   Disposition: Meagan [name] appears stable.  She has completed 19 cycles of Pembrolizumab.  There is no clinical evidence of disease progression.  Plan to proceed with cycle 20 today as scheduled.  She will return for lab, follow-up and the next cycle of Pembrolizumab in 3 weeks.  She will contact the office in the interim with any problems.    Ned Card ANP/GNP-BC   06/12/2018  11:25 AM

## 2018-07-03 ENCOUNTER — Other Ambulatory Visit: Payer: Medicaid Other

## 2018-07-03 ENCOUNTER — Inpatient Hospital Stay: Payer: Self-pay

## 2018-07-03 ENCOUNTER — Inpatient Hospital Stay: Payer: Self-pay | Attending: Oncology | Admitting: Oncology

## 2018-07-03 DIAGNOSIS — C180 Malignant neoplasm of cecum: Secondary | ICD-10-CM | POA: Insufficient documentation

## 2018-07-03 DIAGNOSIS — C189 Malignant neoplasm of colon, unspecified: Secondary | ICD-10-CM

## 2018-07-03 DIAGNOSIS — Z95828 Presence of other vascular implants and grafts: Secondary | ICD-10-CM

## 2018-07-03 DIAGNOSIS — E039 Hypothyroidism, unspecified: Secondary | ICD-10-CM | POA: Insufficient documentation

## 2018-07-03 DIAGNOSIS — Z5112 Encounter for antineoplastic immunotherapy: Secondary | ICD-10-CM | POA: Insufficient documentation

## 2018-07-03 LAB — CMP (CANCER CENTER ONLY)
ALBUMIN: 3.6 g/dL (ref 3.5–5.0)
ALT: 14 U/L (ref 0–44)
AST: 15 U/L (ref 15–41)
Alkaline Phosphatase: 61 U/L (ref 38–126)
Anion gap: 7 (ref 5–15)
BILIRUBIN TOTAL: 0.6 mg/dL (ref 0.3–1.2)
BUN: 7 mg/dL (ref 6–20)
CHLORIDE: 105 mmol/L (ref 98–111)
CO2: 26 mmol/L (ref 22–32)
Calcium: 8.9 mg/dL (ref 8.9–10.3)
Creatinine: 0.78 mg/dL (ref 0.44–1.00)
GFR, Est AFR Am: >60 mL/min (ref >60)
GFR, Estimated: >60 mL/min (ref >60)
GLUCOSE: 92 mg/dL (ref 70–99)
POTASSIUM: 3.8 mmol/L (ref 3.5–5.1)
Sodium: 138 mmol/L (ref 135–145)
Total Protein: 7.3 g/dL (ref 6.5–8.1)

## 2018-07-03 LAB — CBC WITH DIFFERENTIAL (CANCER CENTER ONLY)
Abs Immature Granulocytes: 0.01 10*3/uL (ref 0.00–0.07)
BASOS ABS: 0.1 10*3/uL (ref 0.0–0.1)
Basophils Relative: 1 %
EOS PCT: 2 %
Eosinophils Absolute: 0.2 10*3/uL (ref 0.0–0.5)
HEMATOCRIT: 38.6 % (ref 36.0–46.0)
HEMOGLOBIN: 13.1 g/dL (ref 12.0–15.0)
Immature Granulocytes: 0 %
LYMPHS ABS: 1.4 10*3/uL (ref 0.7–4.0)
LYMPHS PCT: 20 %
MCH: 30.7 pg (ref 26.0–34.0)
MCHC: 33.9 g/dL (ref 30.0–36.0)
MCV: 90.4 fL (ref 80.0–100.0)
MONO ABS: 0.5 10*3/uL (ref 0.1–1.0)
Monocytes Relative: 8 %
Neutro Abs: 4.9 10*3/uL (ref 1.7–7.7)
Neutrophils Relative %: 69 %
Platelet Count: 223 10*3/uL (ref 150–400)
RBC: 4.27 MIL/uL (ref 3.87–5.11)
RDW: 11.9 % (ref 11.5–15.5)
WBC Count: 7 10*3/uL (ref 4.0–10.5)
nRBC: 0 % (ref 0.0–0.2)

## 2018-07-03 MED ORDER — SODIUM CHLORIDE 0.9% FLUSH
10.0000 mL | INTRAVENOUS | Status: DC | PRN
  Administered 2018-07-03: 10 mL
  Filled 2018-07-03: qty 10

## 2018-07-03 MED ORDER — SODIUM CHLORIDE 0.9 % IV SOLN
200.0000 mg | Freq: Once | INTRAVENOUS | Status: AC
  Administered 2018-07-03: 200 mg via INTRAVENOUS
  Filled 2018-07-03: qty 8

## 2018-07-03 MED ORDER — SODIUM CHLORIDE 0.9% FLUSH
10.0000 mL | Freq: Once | INTRAVENOUS | Status: AC
  Administered 2018-07-03: 10 mL
  Filled 2018-07-03: qty 10

## 2018-07-03 MED ORDER — LORAZEPAM 0.5 MG PO TABS: 0.5000 mg | ORAL_TABLET | Freq: Three times a day (TID) | ORAL | 0 refills | Status: DC | PRN

## 2018-07-03 MED ORDER — SODIUM CHLORIDE 0.9 % IV SOLN
Freq: Once | INTRAVENOUS | Status: AC
  Administered 2018-07-03: 13:00:00 via INTRAVENOUS
  Filled 2018-07-03: qty 250

## 2018-07-03 MED ORDER — LEVOTHYROXINE SODIUM 100 MCG PO TABS: ORAL_TABLET | ORAL | 1 refills | Status: DC

## 2018-07-03 MED ORDER — HEPARIN SOD (PORK) LOCK FLUSH 100 UNIT/ML IV SOLN
500.0000 [IU] | Freq: Once | INTRAVENOUS | Status: AC | PRN
  Administered 2018-07-03: 500 [IU]
  Filled 2018-07-03: qty 5

## 2018-07-03 NOTE — Patient Instructions (Signed)

## 2018-07-03 NOTE — Patient Instructions (Signed)
[facility] Discharge Instructions for Patients Receiving Chemotherapy  Today you received the following chemotherapy agents:  Keytruda.  To help prevent nausea and vomiting after your treatment, we encourage you to take your nausea medication as directed.   If you develop nausea and vomiting that is not controlled by your nausea medication, call the clinic.   BELOW ARE SYMPTOMS THAT SHOULD BE REPORTED IMMEDIATELY:  *FEVER GREATER THAN 100.5 F  *CHILLS WITH OR WITHOUT FEVER  NAUSEA AND VOMITING THAT IS NOT CONTROLLED WITH YOUR NAUSEA MEDICATION  *UNUSUAL SHORTNESS OF BREATH  *UNUSUAL BRUISING OR BLEEDING  TENDERNESS IN MOUTH AND THROAT WITH OR WITHOUT PRESENCE OF ULCERS  *URINARY PROBLEMS  *BOWEL PROBLEMS  UNUSUAL RASH Items with * indicate a potential emergency and should be followed up as soon as possible.  Feel free to call the clinic should you have any questions or concerns. The clinic phone number is [phone].  Please show the CHEMO ALERT CARD at check-in to the Emergency Department and triage nurse.    

## 2018-07-03 NOTE — Addendum Note (Signed)
Addended by: Betsy Coder B on: 07/03/2018 12:24 PM   Modules accepted: Orders

## 2018-07-03 NOTE — Progress Notes (Signed)
[place] OFFICE PROGRESS NOTE   Diagnosis: Colon cancer  INTERVAL HISTORY:   Ms. Furno returns as scheduled.  She completed another treatment with pembrolizumab on 06/12/2018.  No rash or diarrhea.  No complaint.  She continues thyroid hormone replacement.  Objective:  Vital signs in last 24 hours:  Blood pressure 113/80, pulse 77, temperature 98.4 F (36.9 C), temperature source Oral, resp. rate 17, height 5' 2" (1.575 m), weight 158 lb 14.4 oz (72.1 kg), SpO2 100 %.    HEENT: No thrush or ulcers Resp: Lungs clear bilaterally Cardio: Regular rate and rhythm GI: No hepatomegaly, no mass, nontender Vascular: No leg edema  Skin: No rash  Portacath/PICC-without erythema  Lab Results:  Lab Results  Component Value Date   WBC 7.0 07/03/2018   HGB 13.1 07/03/2018   HCT 38.6 07/03/2018   MCV 90.4 07/03/2018   PLT 223 07/03/2018   NEUTROABS 4.9 07/03/2018    CMP  Lab Results  Component Value Date   NA 138 06/12/2018   K 3.9 06/12/2018   CL 104 06/12/2018   CO2 25 06/12/2018   GLUCOSE 89 06/12/2018   BUN 6 06/12/2018   CREATININE 0.72 06/12/2018   CALCIUM 9.2 06/12/2018   PROT 7.5 06/12/2018   ALBUMIN 3.7 06/12/2018   AST 15 06/12/2018   ALT 12 06/12/2018   ALKPHOS 54 06/12/2018   BILITOT 0.4 06/12/2018   GFRNONAA >60 06/12/2018   GFRAA >60 06/12/2018    Lab Results  Component Value Date   CEA1 2.82 06/12/2018    Medications: I have reviewed the patient's current medications.   Assessment/Plan: 1. Stage IV Colon cancer, cecum, presenting with a palpable low abdominal/pelvic mass ? Colonoscopy 03/31/2017 confirmed a cecum mass, biopsy positive for poorly differentiated adenocarcinoma ? CT abdomen/pelvis 03/29/2017-large necrotic right iliac fossa mass, enlarged right common iliac nodes, loculated pelvic ascites, right lower quadrant mesenteric masses ? Right colectomy/right oophorectomy-salpingectomy 04/04/2017 ? Pathology from the  right colon resection, grade 3 adenocarcinoma of the cecum, pT4b,pN2b ? Loss of MSH6 expression ? PET scan 04/29/2017-residual tumor at the left adnexa, and internal iliac lymph node, and aortocaval node ? Cycle 1 Pembrolizumab09/07/2017 ? Cycle 2 Pembrolizumab 05/30/2017 ? Cycle 3 Pembrolizumab 06/20/2017 ? Cycle4 Pembrolizumab 07/11/2017 ? Cycle 5 Pembrolizumab 08/04/2017 ? Cycle 6 pembrolizumab 08/22/2017 ? Cycle 7 pembrolizumab 09/12/2017 ? CTs 09/29/2017-previously noted retroperitoneal and right lower quadrant adenopathy has resolved, necrotic pelvic mass not visualized, single enhancing nodule at the umbilicus is nonspecific ? Cycle 8 pembrolizumab 10/03/2017 ? Cycle 9 Pembrolizumab 10/24/2017 ? Cycle 10 Pembrolizumab 11/14/2017 ? Cycle 11 pembrolizumab 12/05/2017 ? Cycle 12 pembrolizumab 12/26/2017 ? Cycle 13 Pembrolizumab 01/16/2018 ? Cycle 14 pembrolizumab 02/06/2018 ? Cycle 15 Pembrolizumab 02/27/2018 ? Cycle 16 Pembrolizumab 03/20/2018 ? CT 04/10/2018-no evidence of recurrent malignancy, stable subcutaneous lesion at the anterior abdominal wall ? Cycle 17 pembrolizumab 04/10/2018 ? Cycle 18 Pembrolizumab 05/01/2018 ? Cycle 19 Pembrolizumab 05/22/2018 ? Cycle 20 Pembrolizumab 06/12/2018 ? Cycle 21 pembrolizumab 07/03/2018  2. 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 Anemia-likely iron deficiency anemia secondary to #1.Resolved.  4.G3 P3  5. Appendectomy  5. Maternal aunt with colon cancer  6.Hypothyroid-likely secondary to toxicity from pembrolizumab, thyroid hormone replacement started 08/22/2017  7.Port-A-Cath placement 11/28/2017    Disposition: Meagan [name] appears stable.  She is in remission from colon cancer.  She is tolerating the pembrolizumab well.  She will complete another treatment today.  She will return for an office  visit and pembrolizumab in 3 weeks.  She continues thyroid hormone replacement.  The TSH and  T4 levels were normal on 05/22/2018.  Betsy Coder, MD  07/03/2018  11:09 AM

## 2018-07-15 ENCOUNTER — Other Ambulatory Visit: Payer: Self-pay | Admitting: Nurse Practitioner

## 2018-07-15 DIAGNOSIS — C189 Malignant neoplasm of colon, unspecified: Secondary | ICD-10-CM

## 2018-07-19 ENCOUNTER — Other Ambulatory Visit: Payer: Self-pay | Admitting: Oncology

## 2018-07-23 ENCOUNTER — Other Ambulatory Visit: Payer: Self-pay | Admitting: Emergency Medicine

## 2018-07-23 DIAGNOSIS — C189 Malignant neoplasm of colon, unspecified: Secondary | ICD-10-CM

## 2018-07-24 ENCOUNTER — Encounter: Payer: Self-pay | Admitting: Nurse Practitioner

## 2018-07-24 ENCOUNTER — Inpatient Hospital Stay: Payer: Self-pay

## 2018-07-24 ENCOUNTER — Telehealth: Payer: Self-pay | Admitting: Nurse Practitioner

## 2018-07-24 ENCOUNTER — Inpatient Hospital Stay (HOSPITAL_BASED_OUTPATIENT_CLINIC_OR_DEPARTMENT_OTHER): Payer: Self-pay | Admitting: Nurse Practitioner

## 2018-07-24 VITALS — BP 117/76 | HR 80 | Temp 97.4°F | Resp 17 | Ht 62.0 in | Wt 160.1 lb

## 2018-07-24 DIAGNOSIS — C189 Malignant neoplasm of colon, unspecified: Secondary | ICD-10-CM

## 2018-07-24 DIAGNOSIS — Z95828 Presence of other vascular implants and grafts: Secondary | ICD-10-CM

## 2018-07-24 DIAGNOSIS — R05 Cough: Secondary | ICD-10-CM

## 2018-07-24 DIAGNOSIS — R070 Pain in throat: Secondary | ICD-10-CM

## 2018-07-24 DIAGNOSIS — C180 Malignant neoplasm of cecum: Secondary | ICD-10-CM

## 2018-07-24 LAB — CMP (CANCER CENTER ONLY)
ALT: 21 U/L (ref 0–44)
ANION GAP: 8 (ref 5–15)
AST: 17 U/L (ref 15–41)
Albumin: 3.7 g/dL (ref 3.5–5.0)
Alkaline Phosphatase: 66 U/L (ref 38–126)
BUN: 10 mg/dL (ref 6–20)
CO2: 23 mmol/L (ref 22–32)
Calcium: 8.9 mg/dL (ref 8.9–10.3)
Chloride: 106 mmol/L (ref 98–111)
Creatinine: 0.76 mg/dL (ref 0.44–1.00)
Glucose, Bld: 108 mg/dL — ABNORMAL HIGH (ref 70–99)
POTASSIUM: 3.8 mmol/L (ref 3.5–5.1)
SODIUM: 137 mmol/L (ref 135–145)
Total Bilirubin: 0.3 mg/dL (ref 0.3–1.2)
Total Protein: 7.5 g/dL (ref 6.5–8.1)

## 2018-07-24 LAB — CBC WITH DIFFERENTIAL (CANCER CENTER ONLY)
ABS IMMATURE GRANULOCYTES: 0.02 10*3/uL (ref 0.00–0.07)
BASOS ABS: 0 10*3/uL (ref 0.0–0.1)
Basophils Relative: 1 %
EOS PCT: 3 %
Eosinophils Absolute: 0.2 10*3/uL (ref 0.0–0.5)
HCT: 39.1 % (ref 36.0–46.0)
HEMOGLOBIN: 13.1 g/dL (ref 12.0–15.0)
IMMATURE GRANULOCYTES: 0 %
LYMPHS PCT: 22 %
Lymphs Abs: 1.3 10*3/uL (ref 0.7–4.0)
MCH: 30.4 pg (ref 26.0–34.0)
MCHC: 33.5 g/dL (ref 30.0–36.0)
MCV: 90.7 fL (ref 80.0–100.0)
Monocytes Absolute: 0.5 10*3/uL (ref 0.1–1.0)
Monocytes Relative: 9 %
NEUTROS ABS: 4 10*3/uL (ref 1.7–7.7)
NEUTROS PCT: 65 %
NRBC: 0 % (ref 0.0–0.2)
Platelet Count: 203 10*3/uL (ref 150–400)
RBC: 4.31 MIL/uL (ref 3.87–5.11)
RDW: 11.9 % (ref 11.5–15.5)
WBC: 6 10*3/uL (ref 4.0–10.5)

## 2018-07-24 LAB — TSH: TSH: 1.682 u[IU]/mL (ref 0.308–3.960)

## 2018-07-24 MED ORDER — HEPARIN SOD (PORK) LOCK FLUSH 100 UNIT/ML IV SOLN
500.0000 [IU] | Freq: Once | INTRAVENOUS | Status: AC | PRN
  Administered 2018-07-24: 500 [IU]
  Filled 2018-07-24: qty 5

## 2018-07-24 MED ORDER — SODIUM CHLORIDE 0.9% FLUSH
10.0000 mL | INTRAVENOUS | Status: DC | PRN
  Administered 2018-07-24: 10 mL
  Filled 2018-07-24: qty 10

## 2018-07-24 MED ORDER — SODIUM CHLORIDE 0.9 % IV SOLN
200.0000 mg | Freq: Once | INTRAVENOUS | Status: AC
  Administered 2018-07-24: 200 mg via INTRAVENOUS
  Filled 2018-07-24: qty 8

## 2018-07-24 MED ORDER — SODIUM CHLORIDE 0.9 % IV SOLN
Freq: Once | INTRAVENOUS | Status: AC
  Administered 2018-07-24: 13:00:00 via INTRAVENOUS
  Filled 2018-07-24: qty 250

## 2018-07-24 MED ORDER — SODIUM CHLORIDE 0.9% FLUSH
10.0000 mL | Freq: Once | INTRAVENOUS | Status: AC
  Administered 2018-07-24: 10 mL
  Filled 2018-07-24: qty 10

## 2018-07-24 NOTE — Telephone Encounter (Signed)
Printed calendar and avs. °

## 2018-07-24 NOTE — Progress Notes (Signed)
[place] OFFICE PROGRESS NOTE   Diagnosis: Colon cancer  INTERVAL HISTORY:   Meagan [name] returns as scheduled.  She completed another cycle of Pembrolizumab 07/03/2018.  She denies nausea/vomiting.  No mouth sores.  No diarrhea.  No rash.  She denies abdominal pain.  Approximately 5 days ago she developed a sore throat, nasal congestion, sneezing and coughing.  The sore throat and nasal congestion have improved and overall she feels better.  She continues to note a cough.  She denies fever.  No shortness of breath.  She confirms good fluid intake.  She has a good appetite.  Objective:  Vital signs in last 24 hours:  Blood pressure 117/76, pulse 80, temperature (!) 97.4 F (36.3 C), temperature source Oral, resp. rate 17, height 5' 2" (1.575 m), weight 160 lb 1.6 oz (72.6 kg), SpO2 100 %.    HEENT: Throat with mild erythema.  No exudate. Resp: Lungs clear bilaterally.  No wheezes or rales. Cardio: Regular rate and rhythm. GI: Abdomen soft and nontender.  No hepatomegaly. Vascular: No leg edema.  Skin: No rash. Port-A-Cath without erythema.   Lab Results:  Lab Results  Component Value Date   WBC 6.0 07/24/2018   HGB 13.1 07/24/2018   HCT 39.1 07/24/2018   MCV 90.7 07/24/2018   PLT 203 07/24/2018   NEUTROABS 4.0 07/24/2018    Imaging:  No results found.  Medications: I have reviewed the patient's current medications.  Assessment/Plan: 1. Stage IV Colon cancer, cecum, presenting with a palpable low abdominal/pelvic mass ? Colonoscopy 03/31/2017 confirmed a cecum mass, biopsy positive for poorly differentiated adenocarcinoma ? CT abdomen/pelvis 03/29/2017-large necrotic right iliac fossa mass, enlarged right common iliac nodes, loculated pelvic ascites, right lower quadrant mesenteric masses ? Right colectomy/right oophorectomy-salpingectomy 04/04/2017 ? Pathology from the right colon resection, grade 3 adenocarcinoma of the cecum, pT4b,pN2b ? Loss of  MSH6 expression ? PET scan 04/29/2017-residual tumor at the left adnexa, and internal iliac lymph node, and aortocaval node ? Cycle 1 Pembrolizumab09/07/2017 ? Cycle 2 Pembrolizumab 05/30/2017 ? Cycle 3 Pembrolizumab 06/20/2017 ? Cycle4 Pembrolizumab 07/11/2017 ? Cycle 5 Pembrolizumab 08/04/2017 ? Cycle 6 pembrolizumab 08/22/2017 ? Cycle 7 pembrolizumab 09/12/2017 ? CTs 09/29/2017-previously noted retroperitoneal and right lower quadrant adenopathy has resolved, necrotic pelvic mass not visualized, single enhancing nodule at the umbilicus is nonspecific ? Cycle 8 pembrolizumab 10/03/2017 ? Cycle 9 Pembrolizumab 10/24/2017 ? Cycle 10 Pembrolizumab 11/14/2017 ? Cycle 11 pembrolizumab 12/05/2017 ? Cycle 12 pembrolizumab 12/26/2017 ? Cycle 13 Pembrolizumab 01/16/2018 ? Cycle 14 pembrolizumab 02/06/2018 ? Cycle 15 Pembrolizumab 02/27/2018 ? Cycle 16 Pembrolizumab 03/20/2018 ? CT 04/10/2018-no evidence of recurrent malignancy, stable subcutaneous lesion at the anterior abdominal wall ? Cycle 17 pembrolizumab 04/10/2018 ? Cycle 18 Pembrolizumab 05/01/2018 ? Cycle 19 Pembrolizumab 05/22/2018 ? Cycle 20 Pembrolizumab 06/12/2018 ? Cycle 21 pembrolizumab 07/03/2018 ? Cycle 22 Pembrolizumab 07/24/2018  2. 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 Anemia-likely iron deficiency anemia secondary to #1.Resolved.  4.G3 P3  5. Appendectomy  5. Maternal aunt with colon cancer  6.Hypothyroid-likely secondary to toxicity from pembrolizumab, thyroid hormone replacement started 08/22/2017  7.Port-A-Cath placement 11/28/2017   Disposition: [name] appears stable.  There is no clinical evidence of progression of the colon cancer.  Plan to continue Pembrolizumab every 3 weeks.  She continues Synthroid.  We will follow-up on the TSH from today.  She likely has a viral URI.  Symptoms are beginning to improve.  She understands to seek evaluation  for fever,  chills, shortness of breath, worsening of current symptoms.  She will return for lab, follow-up and Pembrolizumab in 3 weeks.  She will contact the office in the interim as outlined above or with any other problems.    Ned Card ANP/GNP-BC   07/24/2018  12:12 PM

## 2018-07-24 NOTE — Patient Instructions (Signed)
[facility] Discharge Instructions for Patients Receiving Chemotherapy  Today you received the following chemotherapy agents:  Keytruda.  To help prevent nausea and vomiting after your treatment, we encourage you to take your nausea medication as directed.   If you develop nausea and vomiting that is not controlled by your nausea medication, call the clinic.   BELOW ARE SYMPTOMS THAT SHOULD BE REPORTED IMMEDIATELY:  *FEVER GREATER THAN 100.5 F  *CHILLS WITH OR WITHOUT FEVER  NAUSEA AND VOMITING THAT IS NOT CONTROLLED WITH YOUR NAUSEA MEDICATION  *UNUSUAL SHORTNESS OF BREATH  *UNUSUAL BRUISING OR BLEEDING  TENDERNESS IN MOUTH AND THROAT WITH OR WITHOUT PRESENCE OF ULCERS  *URINARY PROBLEMS  *BOWEL PROBLEMS  UNUSUAL RASH Items with * indicate a potential emergency and should be followed up as soon as possible.  Feel free to call the clinic should you have any questions or concerns. The clinic phone number is [phone].  Please show the CHEMO ALERT CARD at check-in to the Emergency Department and triage nurse.    

## 2018-08-09 ENCOUNTER — Other Ambulatory Visit: Payer: Self-pay | Admitting: Oncology

## 2018-08-14 ENCOUNTER — Inpatient Hospital Stay: Payer: Self-pay

## 2018-08-14 ENCOUNTER — Inpatient Hospital Stay: Payer: Self-pay | Attending: Oncology

## 2018-08-14 ENCOUNTER — Inpatient Hospital Stay (HOSPITAL_BASED_OUTPATIENT_CLINIC_OR_DEPARTMENT_OTHER): Payer: Self-pay | Admitting: Oncology

## 2018-08-14 VITALS — BP 106/75 | HR 74 | Temp 98.4°F | Resp 17 | Ht 62.0 in | Wt 161.1 lb

## 2018-08-14 DIAGNOSIS — C180 Malignant neoplasm of cecum: Secondary | ICD-10-CM | POA: Insufficient documentation

## 2018-08-14 DIAGNOSIS — C189 Malignant neoplasm of colon, unspecified: Secondary | ICD-10-CM

## 2018-08-14 DIAGNOSIS — Z5112 Encounter for antineoplastic immunotherapy: Secondary | ICD-10-CM | POA: Insufficient documentation

## 2018-08-14 DIAGNOSIS — Z95828 Presence of other vascular implants and grafts: Secondary | ICD-10-CM

## 2018-08-14 LAB — CBC WITH DIFFERENTIAL (CANCER CENTER ONLY)
Abs Immature Granulocytes: 0.01 10*3/uL (ref 0.00–0.07)
Basophils Absolute: 0 10*3/uL (ref 0.0–0.1)
Basophils Relative: 1 %
EOS ABS: 0.1 10*3/uL (ref 0.0–0.5)
Eosinophils Relative: 2 %
HCT: 38.5 % (ref 36.0–46.0)
Hemoglobin: 13.1 g/dL (ref 12.0–15.0)
Immature Granulocytes: 0 %
Lymphocytes Relative: 22 %
Lymphs Abs: 1.3 10*3/uL (ref 0.7–4.0)
MCH: 30.3 pg (ref 26.0–34.0)
MCHC: 34 g/dL (ref 30.0–36.0)
MCV: 88.9 fL (ref 80.0–100.0)
MONOS PCT: 8 %
Monocytes Absolute: 0.4 10*3/uL (ref 0.1–1.0)
NEUTROS PCT: 67 %
Neutro Abs: 3.9 10*3/uL (ref 1.7–7.7)
Platelet Count: 228 10*3/uL (ref 150–400)
RBC: 4.33 MIL/uL (ref 3.87–5.11)
RDW: 12.1 % (ref 11.5–15.5)
WBC Count: 5.7 10*3/uL (ref 4.0–10.5)
nRBC: 0 % (ref 0.0–0.2)

## 2018-08-14 LAB — CMP (CANCER CENTER ONLY)
ALT: 8 U/L (ref 0–44)
AST: 13 U/L — ABNORMAL LOW (ref 15–41)
Albumin: 3.8 g/dL (ref 3.5–5.0)
Alkaline Phosphatase: 56 U/L (ref 38–126)
Anion gap: 9 (ref 5–15)
BUN: 9 mg/dL (ref 6–20)
CO2: 23 mmol/L (ref 22–32)
Calcium: 9 mg/dL (ref 8.9–10.3)
Chloride: 107 mmol/L (ref 98–111)
Creatinine: 0.82 mg/dL (ref 0.44–1.00)
GFR, Est AFR Am: >60 mL/min (ref >60)
Glucose, Bld: 98 mg/dL (ref 70–99)
Potassium: 3.7 mmol/L (ref 3.5–5.1)
Sodium: 139 mmol/L (ref 135–145)
Total Bilirubin: 0.6 mg/dL (ref 0.3–1.2)
Total Protein: 7.4 g/dL (ref 6.5–8.1)

## 2018-08-14 MED ORDER — SODIUM CHLORIDE 0.9% FLUSH
10.0000 mL | Freq: Once | INTRAVENOUS | Status: AC
  Administered 2018-08-14: 10 mL
  Filled 2018-08-14: qty 10

## 2018-08-14 MED ORDER — SODIUM CHLORIDE 0.9 % IV SOLN
Freq: Once | INTRAVENOUS | Status: AC
  Administered 2018-08-14: 12:00:00 via INTRAVENOUS
  Filled 2018-08-14: qty 250

## 2018-08-14 MED ORDER — SODIUM CHLORIDE 0.9% FLUSH
10.0000 mL | INTRAVENOUS | Status: DC | PRN
  Administered 2018-08-14: 10 mL
  Filled 2018-08-14: qty 10

## 2018-08-14 MED ORDER — HEPARIN SOD (PORK) LOCK FLUSH 100 UNIT/ML IV SOLN
500.0000 [IU] | Freq: Once | INTRAVENOUS | Status: AC | PRN
  Administered 2018-08-14: 500 [IU]
  Filled 2018-08-14: qty 5

## 2018-08-14 MED ORDER — SODIUM CHLORIDE 0.9 % IV SOLN
200.0000 mg | Freq: Once | INTRAVENOUS | Status: AC
  Administered 2018-08-14: 200 mg via INTRAVENOUS
  Filled 2018-08-14: qty 8

## 2018-08-14 NOTE — Patient Instructions (Signed)
[facility] Discharge Instructions for Patients Receiving Chemotherapy  Today you received the following chemotherapy agents:  Keytruda.  To help prevent nausea and vomiting after your treatment, we encourage you to take your nausea medication as directed.   If you develop nausea and vomiting that is not controlled by your nausea medication, call the clinic.   BELOW ARE SYMPTOMS THAT SHOULD BE REPORTED IMMEDIATELY:  *FEVER GREATER THAN 100.5 F  *CHILLS WITH OR WITHOUT FEVER  NAUSEA AND VOMITING THAT IS NOT CONTROLLED WITH YOUR NAUSEA MEDICATION  *UNUSUAL SHORTNESS OF BREATH  *UNUSUAL BRUISING OR BLEEDING  TENDERNESS IN MOUTH AND THROAT WITH OR WITHOUT PRESENCE OF ULCERS  *URINARY PROBLEMS  *BOWEL PROBLEMS  UNUSUAL RASH Items with * indicate a potential emergency and should be followed up as soon as possible.  Feel free to call the clinic should you have any questions or concerns. The clinic phone number is [phone].  Please show the CHEMO ALERT CARD at check-in to the Emergency Department and triage nurse.    

## 2018-08-14 NOTE — Progress Notes (Signed)
[place] OFFICE PROGRESS NOTE   Diagnosis: Colon cancer  INTERVAL HISTORY:   Meagan [name] completed another treatment with pembrolizumab on 07/24/2018.  She tolerated the treatment well.  No rash or diarrhea.  Good energy level.  She continues thyroid hormone replacement.  Good appetite.  No pain.  Objective:  Vital signs in last 24 hours:  Blood pressure 106/75, pulse 74, temperature 98.4 F (36.9 C), temperature source Oral, resp. rate 17, height 5' 2"  (1.575 m), weight 161 lb 1.6 oz (73.1 kg), SpO2 100 %.    HEENT: No thrush or ulcers Resp: Lungs clear bilaterally Cardio: Regular rate and rhythm GI: No hepatomegaly, nontender Vascular: No leg edema  Skin: No rash  Portacath/PICC-without erythema  Lab Results:  Lab Results  Component Value Date   WBC 5.7 08/14/2018   HGB 13.1 08/14/2018   HCT 38.5 08/14/2018   MCV 88.9 08/14/2018   PLT 228 08/14/2018   NEUTROABS 3.9 08/14/2018    CMP  Lab Results  Component Value Date   NA 137 07/24/2018   K 3.8 07/24/2018   CL 106 07/24/2018   CO2 23 07/24/2018   GLUCOSE 108 (H) 07/24/2018   BUN 10 07/24/2018   CREATININE 0.76 07/24/2018   CALCIUM 8.9 07/24/2018   PROT 7.5 07/24/2018   ALBUMIN 3.7 07/24/2018   AST 17 07/24/2018   ALT 21 07/24/2018   ALKPHOS 66 07/24/2018   BILITOT 0.3 07/24/2018   GFRNONAA >60 07/24/2018   GFRAA >60 07/24/2018    Lab Results  Component Value Date   CEA1 2.82 06/12/2018     Medications: I have reviewed the patient's current medications.   Assessment/Plan: 1. Stage IV Colon cancer, cecum, presenting with a palpable low abdominal/pelvic mass ? Colonoscopy 03/31/2017 confirmed a cecum mass, biopsy positive for poorly differentiated adenocarcinoma ? CT abdomen/pelvis 03/29/2017-large necrotic right iliac fossa mass, enlarged right common iliac nodes, loculated pelvic ascites, right lower quadrant mesenteric masses ? Right colectomy/right  oophorectomy-salpingectomy 04/04/2017 ? Pathology from the right colon resection, grade 3 adenocarcinoma of the cecum, pT4b,pN2b ? Loss of MSH6 expression ? PET scan 04/29/2017-residual tumor at the left adnexa, and internal iliac lymph node, and aortocaval node ? Cycle 1 Pembrolizumab09/07/2017 ? Cycle 2 Pembrolizumab 05/30/2017 ? Cycle 3 Pembrolizumab 06/20/2017 ? Cycle4 Pembrolizumab 07/11/2017 ? Cycle 5 Pembrolizumab 08/04/2017 ? Cycle 6 pembrolizumab 08/22/2017 ? Cycle 7 pembrolizumab 09/12/2017 ? CTs 09/29/2017-previously noted retroperitoneal and right lower quadrant adenopathy has resolved, necrotic pelvic mass not visualized, single enhancing nodule at the umbilicus is nonspecific ? Cycle 8 pembrolizumab 10/03/2017 ? Cycle 9 Pembrolizumab 10/24/2017 ? Cycle 10 Pembrolizumab 11/14/2017 ? Cycle 11 pembrolizumab 12/05/2017 ? Cycle 12 pembrolizumab 12/26/2017 ? Cycle 13 Pembrolizumab 01/16/2018 ? Cycle 14 pembrolizumab 02/06/2018 ? Cycle 15 Pembrolizumab 02/27/2018 ? Cycle 16 Pembrolizumab 03/20/2018 ? CT 04/10/2018-no evidence of recurrent malignancy, stable subcutaneous lesion at the anterior abdominal wall ? Cycle 17 pembrolizumab 04/10/2018 ? Cycle 18 Pembrolizumab 05/01/2018 ? Cycle 19 Pembrolizumab 05/22/2018 ? Cycle 20 Pembrolizumab 06/12/2018 ? Cycle 21 pembrolizumab 07/03/2018 ? Cycle 22 Pembrolizumab 07/24/2018 ? Cycle 23 pembrolizumab 08/14/2018  2. 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 Anemia-likely iron deficiency anemia secondary to #1.Resolved.  4.G3 P3  5. Appendectomy  5. Maternal aunt with colon cancer  6.Hypothyroid-likely secondary to toxicity from pembrolizumab, thyroid hormone replacement started 08/22/2017  7.Port-A-Cath placement 11/28/2017     Disposition: Meagan [name] appears unchanged.  She will complete another treatment with pembrolizumab today.  She will return for an office visit and  pembrolizumab in 3 weeks.  We will check the CEA and TSH when she returns in 3 weeks.  15 minutes were spent with the patient today.  The majority of the time was used for counseling and coordination of care.  Betsy Coder, MD  08/14/2018  11:54 AM

## 2018-08-30 ENCOUNTER — Other Ambulatory Visit: Payer: Self-pay | Admitting: Oncology

## 2018-09-04 ENCOUNTER — Telehealth: Payer: Self-pay | Admitting: Oncology

## 2018-09-04 ENCOUNTER — Inpatient Hospital Stay: Payer: Self-pay

## 2018-09-04 ENCOUNTER — Inpatient Hospital Stay: Payer: Self-pay | Attending: Oncology | Admitting: Oncology

## 2018-09-04 VITALS — BP 130/82 | HR 80 | Temp 98.8°F | Resp 17 | Ht 62.0 in | Wt 159.4 lb

## 2018-09-04 DIAGNOSIS — C189 Malignant neoplasm of colon, unspecified: Secondary | ICD-10-CM

## 2018-09-04 DIAGNOSIS — C180 Malignant neoplasm of cecum: Secondary | ICD-10-CM

## 2018-09-04 DIAGNOSIS — Z800 Family history of malignant neoplasm of digestive organs: Secondary | ICD-10-CM | POA: Insufficient documentation

## 2018-09-04 DIAGNOSIS — Z5112 Encounter for antineoplastic immunotherapy: Secondary | ICD-10-CM | POA: Insufficient documentation

## 2018-09-04 DIAGNOSIS — Z95828 Presence of other vascular implants and grafts: Secondary | ICD-10-CM

## 2018-09-04 DIAGNOSIS — Z79899 Other long term (current) drug therapy: Secondary | ICD-10-CM | POA: Insufficient documentation

## 2018-09-04 LAB — CMP (CANCER CENTER ONLY)
ALT: 9 U/L (ref 0–44)
ANION GAP: 7 (ref 5–15)
AST: 11 U/L — ABNORMAL LOW (ref 15–41)
Albumin: 3.7 g/dL (ref 3.5–5.0)
Alkaline Phosphatase: 67 U/L (ref 38–126)
BUN: 8 mg/dL (ref 6–20)
CO2: 24 mmol/L (ref 22–32)
Calcium: 9.2 mg/dL (ref 8.9–10.3)
Chloride: 107 mmol/L (ref 98–111)
Creatinine: 0.73 mg/dL (ref 0.44–1.00)
GFR, Est AFR Am: >60 mL/min (ref >60)
GFR, Estimated: >60 mL/min (ref >60)
Glucose, Bld: 77 mg/dL (ref 70–99)
POTASSIUM: 3.9 mmol/L (ref 3.5–5.1)
Sodium: 138 mmol/L (ref 135–145)
Total Bilirubin: 0.3 mg/dL (ref 0.3–1.2)
Total Protein: 7.4 g/dL (ref 6.5–8.1)

## 2018-09-04 LAB — CEA (IN HOUSE-CHCC): CEA (CHCC-IN HOUSE): 4.64 ng/mL (ref 0.00–5.00)

## 2018-09-04 LAB — TSH: TSH: 0.723 u[IU]/mL (ref 0.308–3.960)

## 2018-09-04 MED ORDER — SODIUM CHLORIDE 0.9 % IV SOLN
200.0000 mg | Freq: Once | INTRAVENOUS | Status: AC
  Administered 2018-09-04: 200 mg via INTRAVENOUS
  Filled 2018-09-04: qty 8

## 2018-09-04 MED ORDER — SODIUM CHLORIDE 0.9% FLUSH
10.0000 mL | Freq: Once | INTRAVENOUS | Status: AC
  Administered 2018-09-04: 10 mL
  Filled 2018-09-04: qty 10

## 2018-09-04 MED ORDER — HEPARIN SOD (PORK) LOCK FLUSH 100 UNIT/ML IV SOLN
500.0000 [IU] | Freq: Once | INTRAVENOUS | Status: AC | PRN
  Administered 2018-09-04: 500 [IU]
  Filled 2018-09-04: qty 5

## 2018-09-04 MED ORDER — HEPARIN SOD (PORK) LOCK FLUSH 100 UNIT/ML IV SOLN
500.0000 [IU] | Freq: Once | INTRAVENOUS | Status: DC
  Filled 2018-09-04: qty 5

## 2018-09-04 MED ORDER — SODIUM CHLORIDE 0.9% FLUSH
10.0000 mL | INTRAVENOUS | Status: DC | PRN
  Administered 2018-09-04: 10 mL
  Filled 2018-09-04: qty 10

## 2018-09-04 MED ORDER — SODIUM CHLORIDE 0.9 % IV SOLN
Freq: Once | INTRAVENOUS | Status: AC
  Administered 2018-09-04: 14:00:00 via INTRAVENOUS
  Filled 2018-09-04: qty 250

## 2018-09-04 NOTE — Progress Notes (Signed)
[place] OFFICE PROGRESS NOTE   Diagnosis: Colon cancer  INTERVAL HISTORY:   [name] returns for a scheduled visit.  She was last treated with pembrolizumab on 08/15/2019.  No rash or diarrhea.  She plans to have dental work in the near future.  She is taking thyroid hormone replacement.  Good appetite.  No abdominal pain.  Objective:  Vital signs in last 24 hours:  Blood pressure 130/82, pulse 80, temperature 98.8 F (37.1 C), temperature source Oral, resp. rate 17, height 5' 2"  (1.575 m), weight 159 lb 6.4 oz (72.3 kg), SpO2 99 %.    HEENT: No thrush or ulcer Resp: Lungs clear bilaterally Cardio: Regular rate and rhythm GI: No hepatosplenomegaly, no mass Vascular: No leg edema  Skin: No rash  Portacath/PICC-without erythema  Lab Results:  Lab Results  Component Value Date   WBC 5.7 08/14/2018   HGB 13.1 08/14/2018   HCT 38.5 08/14/2018   MCV 88.9 08/14/2018   PLT 228 08/14/2018   NEUTROABS 3.9 08/14/2018    CMP  Lab Results  Component Value Date   NA 138 09/04/2018   K 3.9 09/04/2018   CL 107 09/04/2018   CO2 24 09/04/2018   GLUCOSE 77 09/04/2018   BUN 8 09/04/2018   CREATININE 0.73 09/04/2018   CALCIUM 9.2 09/04/2018   PROT 7.4 09/04/2018   ALBUMIN 3.7 09/04/2018   AST 11 (L) 09/04/2018   ALT 9 09/04/2018   ALKPHOS 67 09/04/2018   BILITOT 0.3 09/04/2018   GFRNONAA >60 09/04/2018   GFRAA >60 09/04/2018    Lab Results  Component Value Date   CEA1 4.64 09/04/2018   TSH 0.723 Medications: I have reviewed the patient's current medications.   Assessment/Plan: 1. Stage IV Colon cancer, cecum, presenting with a palpable low abdominal/pelvic mass ? Colonoscopy 03/31/2017 confirmed a cecum mass, biopsy positive for poorly differentiated adenocarcinoma ? CT abdomen/pelvis 03/29/2017-large necrotic right iliac fossa mass, enlarged right common iliac nodes, loculated pelvic ascites, right lower quadrant mesenteric masses ? Right  colectomy/right oophorectomy-salpingectomy 04/04/2017 ? Pathology from the right colon resection, grade 3 adenocarcinoma of the cecum, pT4b,pN2b ? Loss of MSH6 expression ? PET scan 04/29/2017-residual tumor at the left adnexa, and internal iliac lymph node, and aortocaval node ? Cycle 1 Pembrolizumab09/07/2017 ? Cycle 2 Pembrolizumab 05/30/2017 ? Cycle 3 Pembrolizumab 06/20/2017 ? Cycle4 Pembrolizumab 07/11/2017 ? Cycle 5 Pembrolizumab 08/04/2017 ? Cycle 6 pembrolizumab 08/22/2017 ? Cycle 7 pembrolizumab 09/12/2017 ? CTs 09/29/2017-previously noted retroperitoneal and right lower quadrant adenopathy has resolved, necrotic pelvic mass not visualized, single enhancing nodule at the umbilicus is nonspecific ? Cycle 8 pembrolizumab 10/03/2017 ? Cycle 9 Pembrolizumab 10/24/2017 ? Cycle 10 Pembrolizumab 11/14/2017 ? Cycle 11 pembrolizumab 12/05/2017 ? Cycle 12 pembrolizumab 12/26/2017 ? Cycle 13 Pembrolizumab 01/16/2018 ? Cycle 14 pembrolizumab 02/06/2018 ? Cycle 15 Pembrolizumab 02/27/2018 ? Cycle 16 Pembrolizumab 03/20/2018 ? CT 04/10/2018-no evidence of recurrent malignancy, stable subcutaneous lesion at the anterior abdominal wall ? Cycle 17 pembrolizumab 04/10/2018 ? Cycle 18 Pembrolizumab 05/01/2018 ? Cycle 19 Pembrolizumab 05/22/2018 ? Cycle 20 Pembrolizumab 06/12/2018 ? Cycle 21 pembrolizumab 07/03/2018 ? Cycle 22 Pembrolizumab 07/24/2018 ? Cycle 23 pembrolizumab 08/14/2018  2. 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 Anemia-likely iron deficiency anemia secondary to #1.Resolved.  4.G3 P3  5. Appendectomy  5. Maternal aunt with colon cancer  6.Hypothyroid-likely secondary to toxicity from pembrolizumab, thyroid hormone replacement started 08/22/2017  7.Port-A-Cath placement 11/28/2017   Disposition: [name] appears unchanged.  She will complete another treatment with pembrolizumab today.  She will return for an office visit in  the next cycle of pembrolizumab in 3 weeks.  We will follow the CEA and plan for a restaging CT evaluation within the next 6 months.  15 minutes were spent with the patient today.  The majority of the time was used for counseling and coordination of care.  Betsy Coder, MD  09/04/2018  5:42 PM

## 2018-09-04 NOTE — Progress Notes (Signed)
Per Ned Card NP, ok to treat without CBC results from today's visit.

## 2018-09-04 NOTE — Telephone Encounter (Signed)
Added treatment for 01/24 per 01/03 los.  Patient declined avs and calendar.

## 2018-09-04 NOTE — Progress Notes (Signed)
Patient is very anxious and angry today due to infusion appointment was not scheduled. She is not able to return tomorrow since her husband got off work today and babysitter has been lined up for today. Spoke with infusion room charge nurse/manager. She will be added to schedule today.

## 2018-09-04 NOTE — Patient Instructions (Signed)
[facility] Discharge Instructions for Patients Receiving Chemotherapy  Today you received the following chemotherapy agents:  Keytruda.  To help prevent nausea and vomiting after your treatment, we encourage you to take your nausea medication as directed.   If you develop nausea and vomiting that is not controlled by your nausea medication, call the clinic.   BELOW ARE SYMPTOMS THAT SHOULD BE REPORTED IMMEDIATELY:  *FEVER GREATER THAN 100.5 F  *CHILLS WITH OR WITHOUT FEVER  NAUSEA AND VOMITING THAT IS NOT CONTROLLED WITH YOUR NAUSEA MEDICATION  *UNUSUAL SHORTNESS OF BREATH  *UNUSUAL BRUISING OR BLEEDING  TENDERNESS IN MOUTH AND THROAT WITH OR WITHOUT PRESENCE OF ULCERS  *URINARY PROBLEMS  *BOWEL PROBLEMS  UNUSUAL RASH Items with * indicate a potential emergency and should be followed up as soon as possible.  Feel free to call the clinic should you have any questions or concerns. The clinic phone number is [phone].  Please show the CHEMO ALERT CARD at check-in to the Emergency Department and triage nurse.    

## 2018-09-16 ENCOUNTER — Other Ambulatory Visit: Payer: Self-pay | Admitting: Oncology

## 2018-09-16 DIAGNOSIS — C189 Malignant neoplasm of colon, unspecified: Secondary | ICD-10-CM

## 2018-09-20 ENCOUNTER — Other Ambulatory Visit: Payer: Self-pay | Admitting: Oncology

## 2018-09-25 ENCOUNTER — Inpatient Hospital Stay: Payer: Self-pay

## 2018-09-25 ENCOUNTER — Inpatient Hospital Stay (HOSPITAL_BASED_OUTPATIENT_CLINIC_OR_DEPARTMENT_OTHER): Payer: Self-pay | Admitting: Oncology

## 2018-09-25 ENCOUNTER — Telehealth: Payer: Self-pay | Admitting: Oncology

## 2018-09-25 VITALS — BP 119/80 | HR 83 | Temp 98.3°F | Resp 18 | Ht 62.0 in | Wt 158.1 lb

## 2018-09-25 DIAGNOSIS — C189 Malignant neoplasm of colon, unspecified: Secondary | ICD-10-CM

## 2018-09-25 DIAGNOSIS — C180 Malignant neoplasm of cecum: Secondary | ICD-10-CM

## 2018-09-25 DIAGNOSIS — Z95828 Presence of other vascular implants and grafts: Secondary | ICD-10-CM

## 2018-09-25 LAB — CMP (CANCER CENTER ONLY)
ALK PHOS: 59 U/L (ref 38–126)
ALT: <6 U/L (ref 0–44)
ANION GAP: 6 (ref 5–15)
AST: 10 U/L — ABNORMAL LOW (ref 15–41)
Albumin: 3.6 g/dL (ref 3.5–5.0)
BUN: 6 mg/dL (ref 6–20)
CO2: 25 mmol/L (ref 22–32)
Calcium: 8.9 mg/dL (ref 8.9–10.3)
Chloride: 108 mmol/L (ref 98–111)
Creatinine: 0.73 mg/dL (ref 0.44–1.00)
GFR, Est AFR Am: >60 mL/min (ref >60)
GFR, Estimated: >60 mL/min (ref >60)
Glucose, Bld: 94 mg/dL (ref 70–99)
Potassium: 3.8 mmol/L (ref 3.5–5.1)
Sodium: 139 mmol/L (ref 135–145)
TOTAL PROTEIN: 7 g/dL (ref 6.5–8.1)
Total Bilirubin: 0.6 mg/dL (ref 0.3–1.2)

## 2018-09-25 LAB — CBC WITH DIFFERENTIAL (CANCER CENTER ONLY)
Abs Immature Granulocytes: 0.01 10*3/uL (ref 0.00–0.07)
Basophils Absolute: 0 10*3/uL (ref 0.0–0.1)
Basophils Relative: 1 %
Eosinophils Absolute: 0.1 10*3/uL (ref 0.0–0.5)
Eosinophils Relative: 1 %
HCT: 36.6 % (ref 36.0–46.0)
Hemoglobin: 12.5 g/dL (ref 12.0–15.0)
Immature Granulocytes: 0 %
Lymphocytes Relative: 23 %
Lymphs Abs: 1.2 10*3/uL (ref 0.7–4.0)
MCH: 30.4 pg (ref 26.0–34.0)
MCHC: 34.2 g/dL (ref 30.0–36.0)
MCV: 89.1 fL (ref 80.0–100.0)
Monocytes Absolute: 0.4 10*3/uL (ref 0.1–1.0)
Monocytes Relative: 8 %
Neutro Abs: 3.4 10*3/uL (ref 1.7–7.7)
Neutrophils Relative %: 67 %
PLATELETS: 222 10*3/uL (ref 150–400)
RBC: 4.11 MIL/uL (ref 3.87–5.11)
RDW: 12.4 % (ref 11.5–15.5)
WBC: 5.1 10*3/uL (ref 4.0–10.5)
nRBC: 0 % (ref 0.0–0.2)

## 2018-09-25 LAB — TSH: TSH: 0.469 u[IU]/mL (ref 0.308–3.960)

## 2018-09-25 MED ORDER — SODIUM CHLORIDE 0.9% FLUSH
10.0000 mL | INTRAVENOUS | Status: DC | PRN
  Administered 2018-09-25: 10 mL
  Filled 2018-09-25: qty 10

## 2018-09-25 MED ORDER — SODIUM CHLORIDE 0.9 % IV SOLN
Freq: Once | INTRAVENOUS | Status: AC
  Administered 2018-09-25: 12:00:00 via INTRAVENOUS
  Filled 2018-09-25: qty 250

## 2018-09-25 MED ORDER — SODIUM CHLORIDE 0.9 % IV SOLN
200.0000 mg | Freq: Once | INTRAVENOUS | Status: AC
  Administered 2018-09-25: 200 mg via INTRAVENOUS
  Filled 2018-09-25: qty 8

## 2018-09-25 MED ORDER — HEPARIN SOD (PORK) LOCK FLUSH 100 UNIT/ML IV SOLN
500.0000 [IU] | Freq: Once | INTRAVENOUS | Status: AC | PRN
  Administered 2018-09-25: 500 [IU]
  Filled 2018-09-25: qty 5

## 2018-09-25 MED ORDER — SODIUM CHLORIDE 0.9% FLUSH
10.0000 mL | Freq: Once | INTRAVENOUS | Status: AC
  Administered 2018-09-25: 10 mL
  Filled 2018-09-25: qty 10

## 2018-09-25 NOTE — Progress Notes (Signed)
[place] OFFICE PROGRESS NOTE   Diagnosis: Colon cancer  INTERVAL HISTORY:   [name] returns as scheduled.  She completed another treatment with pembrolizumab on 11/2018.  She feels well.  No abdominal pain.  No rash or diarrhea.  She is taking thyroid hormone replacement.  No complaint.  She underwent total odontectomy and has dentures.  Objective:  Vital signs in last 24 hours:  Blood pressure 119/80, pulse 83, temperature 98.3 F (36.8 C), temperature source Oral, resp. rate 18, height 5' 2"  (1.575 m), weight 158 lb 1.6 oz (71.7 kg), SpO2 100 %.   Resp: Lungs clear bilaterally Cardio: Regular rate and rhythm GI: No hepatomegaly, no mass, nontender Vascular: No leg edema  Skin: No rash  Portacath/PICC-without erythema  Lab Results:  Lab Results  Component Value Date   WBC 5.1 09/25/2018   HGB 12.5 09/25/2018   HCT 36.6 09/25/2018   MCV 89.1 09/25/2018   PLT 222 09/25/2018   NEUTROABS 3.4 09/25/2018    CMP  Lab Results  Component Value Date   NA 139 09/25/2018   K 3.8 09/25/2018   CL 108 09/25/2018   CO2 25 09/25/2018   GLUCOSE 94 09/25/2018   BUN 6 09/25/2018   CREATININE 0.73 09/25/2018   CALCIUM 8.9 09/25/2018   PROT 7.0 09/25/2018   ALBUMIN 3.6 09/25/2018   AST 10 (L) 09/25/2018   ALT <6 09/25/2018   ALKPHOS 59 09/25/2018   BILITOT 0.6 09/25/2018   GFRNONAA >60 09/25/2018   GFRAA >60 09/25/2018    Lab Results  Component Value Date   CEA1 4.64 09/04/2018  TSH-0.469 Medications: I have reviewed the patient's current medications.   Assessment/Plan:  1. Stage IV Colon cancer, cecum, presenting with a palpable low abdominal/pelvic mass ? Colonoscopy 03/31/2017 confirmed a cecum mass, biopsy positive for poorly differentiated adenocarcinoma ? CT abdomen/pelvis 03/29/2017-large necrotic right iliac fossa mass, enlarged right common iliac nodes, loculated pelvic ascites, right lower quadrant mesenteric masses ? Right  colectomy/right oophorectomy-salpingectomy 04/04/2017 ? Pathology from the right colon resection, grade 3 adenocarcinoma of the cecum, pT4b,pN2b ? Loss of MSH6 expression ? PET scan 04/29/2017-residual tumor at the left adnexa, and internal iliac lymph node, and aortocaval node ? Cycle 1 Pembrolizumab09/07/2017 ? Cycle 2 Pembrolizumab 05/30/2017 ? Cycle 3 Pembrolizumab 06/20/2017 ? Cycle4 Pembrolizumab 07/11/2017 ? Cycle 5 Pembrolizumab 08/04/2017 ? Cycle 6 pembrolizumab 08/22/2017 ? Cycle 7 pembrolizumab 09/12/2017 ? CTs 09/29/2017-previously noted retroperitoneal and right lower quadrant adenopathy has resolved, necrotic pelvic mass not visualized, single enhancing nodule at the umbilicus is nonspecific ? Cycle 8 pembrolizumab 10/03/2017 ? Cycle 9 Pembrolizumab 10/24/2017 ? Cycle 10 Pembrolizumab 11/14/2017 ? Cycle 11 pembrolizumab 12/05/2017 ? Cycle 12 pembrolizumab 12/26/2017 ? Cycle 13 Pembrolizumab 01/16/2018 ? Cycle 14 pembrolizumab 02/06/2018 ? Cycle 15 Pembrolizumab 02/27/2018 ? Cycle 16 Pembrolizumab 03/20/2018 ? CT 04/10/2018-no evidence of recurrent malignancy, stable subcutaneous lesion at the anterior abdominal wall ? Cycle 17 pembrolizumab 04/10/2018 ? Cycle 18 Pembrolizumab 05/01/2018 ? Cycle 19 Pembrolizumab 05/22/2018 ? Cycle 20 Pembrolizumab 06/12/2018 ? Cycle 21 pembrolizumab 07/03/2018 ? Cycle 22 Pembrolizumab 07/24/2018 ? Cycle 23 pembrolizumab 08/14/2018 ? Cycle 24 pembrolizumab 09/04/2018 ? Cycle 25 pembrolizumab 09/25/2018  2. 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 Anemia-likely iron deficiency anemia secondary to #1.Resolved.  4.G3 P3  5. Appendectomy  5. Maternal aunt with colon cancer  6.Hypothyroid-likely secondary to toxicity from pembrolizumab, thyroid hormone replacement started 08/22/2017  7.Port-A-Cath placement 11/28/2017    Disposition: Ms.  Meagan [name] appears stable.  She is tolerating the  pembrolizumab well.  She continues thyroid hormone replacement.  She will return for an office visit and pembrolizumab in 3 weeks.  Betsy Coder, MD  09/25/2018  11:47 AM

## 2018-09-25 NOTE — Telephone Encounter (Signed)
Scheduled appt per 01/24 los. ° °Printed calendar and avs. °

## 2018-09-25 NOTE — Patient Instructions (Signed)
[facility] Discharge Instructions for Patients Receiving Chemotherapy  Today you received the following chemotherapy agents:  Keytruda.  To help prevent nausea and vomiting after your treatment, we encourage you to take your nausea medication as directed.   If you develop nausea and vomiting that is not controlled by your nausea medication, call the clinic.   BELOW ARE SYMPTOMS THAT SHOULD BE REPORTED IMMEDIATELY:  *FEVER GREATER THAN 100.5 F  *CHILLS WITH OR WITHOUT FEVER  NAUSEA AND VOMITING THAT IS NOT CONTROLLED WITH YOUR NAUSEA MEDICATION  *UNUSUAL SHORTNESS OF BREATH  *UNUSUAL BRUISING OR BLEEDING  TENDERNESS IN MOUTH AND THROAT WITH OR WITHOUT PRESENCE OF ULCERS  *URINARY PROBLEMS  *BOWEL PROBLEMS  UNUSUAL RASH Items with * indicate a potential emergency and should be followed up as soon as possible.  Feel free to call the clinic should you have any questions or concerns. The clinic phone number is [phone].  Please show the CHEMO ALERT CARD at check-in to the Emergency Department and triage nurse.    

## 2018-09-25 NOTE — Progress Notes (Signed)
Patient requested printout of all her appointments since initial visit at [facility]. Printed appointment calendars and gave to patient.

## 2018-10-10 ENCOUNTER — Other Ambulatory Visit: Payer: Self-pay | Admitting: Oncology

## 2018-10-16 ENCOUNTER — Inpatient Hospital Stay (HOSPITAL_BASED_OUTPATIENT_CLINIC_OR_DEPARTMENT_OTHER): Payer: Self-pay | Admitting: Nurse Practitioner

## 2018-10-16 ENCOUNTER — Encounter: Payer: Self-pay | Admitting: Nurse Practitioner

## 2018-10-16 ENCOUNTER — Inpatient Hospital Stay: Payer: Self-pay | Attending: Oncology

## 2018-10-16 ENCOUNTER — Inpatient Hospital Stay: Payer: Self-pay

## 2018-10-16 VITALS — BP 128/91 | HR 82 | Temp 98.5°F | Resp 19 | Ht 62.0 in | Wt 150.7 lb

## 2018-10-16 DIAGNOSIS — C189 Malignant neoplasm of colon, unspecified: Secondary | ICD-10-CM

## 2018-10-16 DIAGNOSIS — Z95828 Presence of other vascular implants and grafts: Secondary | ICD-10-CM

## 2018-10-16 DIAGNOSIS — Z5112 Encounter for antineoplastic immunotherapy: Secondary | ICD-10-CM | POA: Insufficient documentation

## 2018-10-16 DIAGNOSIS — R634 Abnormal weight loss: Secondary | ICD-10-CM

## 2018-10-16 DIAGNOSIS — C180 Malignant neoplasm of cecum: Secondary | ICD-10-CM

## 2018-10-16 LAB — CBC WITH DIFFERENTIAL (CANCER CENTER ONLY)
Abs Immature Granulocytes: 0.02 10*3/uL (ref 0.00–0.07)
BASOS ABS: 0 10*3/uL (ref 0.0–0.1)
Basophils Relative: 1 %
Eosinophils Absolute: 0.1 10*3/uL (ref 0.0–0.5)
Eosinophils Relative: 1 %
HCT: 38.1 % (ref 36.0–46.0)
Hemoglobin: 12.9 g/dL (ref 12.0–15.0)
Immature Granulocytes: 0 %
Lymphocytes Relative: 18 %
Lymphs Abs: 1.2 10*3/uL (ref 0.7–4.0)
MCH: 30.4 pg (ref 26.0–34.0)
MCHC: 33.9 g/dL (ref 30.0–36.0)
MCV: 89.6 fL (ref 80.0–100.0)
Monocytes Absolute: 0.4 10*3/uL (ref 0.1–1.0)
Monocytes Relative: 7 %
Neutro Abs: 4.9 10*3/uL (ref 1.7–7.7)
Neutrophils Relative %: 73 %
PLATELETS: 231 10*3/uL (ref 150–400)
RBC: 4.25 MIL/uL (ref 3.87–5.11)
RDW: 12 % (ref 11.5–15.5)
WBC Count: 6.6 10*3/uL (ref 4.0–10.5)
nRBC: 0 % (ref 0.0–0.2)

## 2018-10-16 LAB — CMP (CANCER CENTER ONLY)
ALT: <6 U/L (ref 0–44)
AST: 12 U/L — ABNORMAL LOW (ref 15–41)
Albumin: 3.9 g/dL (ref 3.5–5.0)
Alkaline Phosphatase: 60 U/L (ref 38–126)
Anion gap: 6 (ref 5–15)
BUN: 7 mg/dL (ref 6–20)
CHLORIDE: 107 mmol/L (ref 98–111)
CO2: 24 mmol/L (ref 22–32)
Calcium: 8.9 mg/dL (ref 8.9–10.3)
Creatinine: 0.79 mg/dL (ref 0.44–1.00)
GFR, Est AFR Am: >60 mL/min (ref >60)
GFR, Estimated: >60 mL/min (ref >60)
Glucose, Bld: 91 mg/dL (ref 70–99)
Potassium: 3.7 mmol/L (ref 3.5–5.1)
Sodium: 137 mmol/L (ref 135–145)
Total Bilirubin: 0.6 mg/dL (ref 0.3–1.2)
Total Protein: 7.4 g/dL (ref 6.5–8.1)

## 2018-10-16 LAB — CEA (IN HOUSE-CHCC): CEA (CHCC-In House): 3.68 ng/mL (ref 0.00–5.00)

## 2018-10-16 MED ORDER — LEVOTHYROXINE SODIUM 100 MCG PO TABS: 100.0000 ug | ORAL_TABLET | Freq: Every day | ORAL | 1 refills | Status: DC

## 2018-10-16 MED ORDER — SODIUM CHLORIDE 0.9% FLUSH
10.0000 mL | Freq: Once | INTRAVENOUS | Status: AC
  Administered 2018-10-16: 10 mL
  Filled 2018-10-16: qty 10

## 2018-10-16 MED ORDER — SODIUM CHLORIDE 0.9% FLUSH
10.0000 mL | INTRAVENOUS | Status: DC | PRN
  Administered 2018-10-16: 10 mL
  Filled 2018-10-16: qty 10

## 2018-10-16 MED ORDER — SODIUM CHLORIDE 0.9 % IV SOLN
Freq: Once | INTRAVENOUS | Status: AC
  Administered 2018-10-16: 12:00:00 via INTRAVENOUS
  Filled 2018-10-16: qty 250

## 2018-10-16 MED ORDER — SODIUM CHLORIDE 0.9 % IV SOLN
200.0000 mg | Freq: Once | INTRAVENOUS | Status: AC
  Administered 2018-10-16: 200 mg via INTRAVENOUS
  Filled 2018-10-16: qty 8

## 2018-10-16 MED ORDER — HEPARIN SOD (PORK) LOCK FLUSH 100 UNIT/ML IV SOLN
500.0000 [IU] | Freq: Once | INTRAVENOUS | Status: AC | PRN
  Administered 2018-10-16: 500 [IU]
  Filled 2018-10-16: qty 5

## 2018-10-16 NOTE — Progress Notes (Signed)
[place] OFFICE PROGRESS NOTE   Diagnosis: Colon cancer  INTERVAL HISTORY:   Meagan [name] returns as scheduled.  She completed another cycle of pembrolizumab 09/25/2018.  She feels well.  No nausea, diarrhea or skin rash.  No mouth sores.  She has a good appetite.  She has lost weight since her last visit.  She attributes this to the dentures.  Objective:  Vital signs in last 24 hours:  Blood pressure (!) 128/91, pulse 82, temperature 98.5 F (36.9 C), temperature source Oral, resp. rate 19, height 5' 2"  (1.575 m), weight 150 lb 11.2 oz (68.4 kg), SpO2 100 %.    HEENT: No thrush or ulcers. Lymphatics: No palpable cervical or supraclavicular lymph nodes. Resp: Lungs clear bilaterally. Cardio: Regular rate and rhythm. GI: Abdomen soft and nontender.  No hepatomegaly.  No mass. Vascular: No leg edema.  Skin: No rash. Port-A-Cath without erythema.   Lab Results:  Lab Results  Component Value Date   WBC 6.6 10/16/2018   HGB 12.9 10/16/2018   HCT 38.1 10/16/2018   MCV 89.6 10/16/2018   PLT 231 10/16/2018   NEUTROABS 4.9 10/16/2018    Imaging:  No results found.  Medications: I have reviewed the patient's current medications.  Assessment/Plan: 1. Stage IV Colon cancer, cecum, presenting with a palpable low abdominal/pelvic mass ? Colonoscopy 03/31/2017 confirmed a cecum mass, biopsy positive for poorly differentiated adenocarcinoma ? CT abdomen/pelvis 03/29/2017-large necrotic right iliac fossa mass, enlarged right common iliac nodes, loculated pelvic ascites, right lower quadrant mesenteric masses ? Right colectomy/right oophorectomy-salpingectomy 04/04/2017 ? Pathology from the right colon resection, grade 3 adenocarcinoma of the cecum, pT4b,pN2b ? Loss of MSH6 expression ? PET scan 04/29/2017-residual tumor at the left adnexa, and internal iliac lymph node, and aortocaval node ? Cycle 1 Pembrolizumab09/07/2017 ? Cycle 2 Pembrolizumab  05/30/2017 ? Cycle 3 Pembrolizumab 06/20/2017 ? Cycle4 Pembrolizumab 07/11/2017 ? Cycle 5 Pembrolizumab 08/04/2017 ? Cycle 6 pembrolizumab 08/22/2017 ? Cycle 7 pembrolizumab 09/12/2017 ? CTs 09/29/2017-previously noted retroperitoneal and right lower quadrant adenopathy has resolved, necrotic pelvic mass not visualized, single enhancing nodule at the umbilicus is nonspecific ? Cycle 8 pembrolizumab 10/03/2017 ? Cycle 9 Pembrolizumab 10/24/2017 ? Cycle 10 Pembrolizumab 11/14/2017 ? Cycle 11 pembrolizumab 12/05/2017 ? Cycle 12 pembrolizumab 12/26/2017 ? Cycle 13 Pembrolizumab 01/16/2018 ? Cycle 14 pembrolizumab 02/06/2018 ? Cycle 15 Pembrolizumab 02/27/2018 ? Cycle 16 Pembrolizumab 03/20/2018 ? CT 04/10/2018-no evidence of recurrent malignancy, stable subcutaneous lesion at the anterior abdominal wall ? Cycle 17 pembrolizumab 04/10/2018 ? Cycle 18 Pembrolizumab 05/01/2018 ? Cycle 19 Pembrolizumab 05/22/2018 ? Cycle 20 Pembrolizumab 06/12/2018 ? Cycle 21 pembrolizumab 07/03/2018 ? Cycle 22 Pembrolizumab 07/24/2018 ? Cycle 23 pembrolizumab 08/14/2018 ? Cycle 24 pembrolizumab 09/04/2018 ? Cycle 25 pembrolizumab 09/25/2018 ? Cycle 26 Pembrolizumab 10/16/2018  2. 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 Anemia-likely iron deficiency anemia secondary to #1.Resolved.  4.G3 P3  5. Appendectomy  5. Maternal aunt with colon cancer  6.Hypothyroid-likely secondary to toxicity from pembrolizumab, thyroid hormone replacement started 08/22/2017  7.Port-A-Cath placement 11/28/2017   Disposition: Meagan [name] appears stable.  She has completed 25 cycles of pembrolizumab.  Plan to proceed with cycle 26 today as scheduled.  We reviewed the CBC and chemistry panel from today.  She continues thyroid hormone replacement.  TSH will be repeated at the time of her next visit.  She will return for lab, follow-up and the next cycle of pembrolizumab in 3 weeks.  She  will contact the  office in the interim with any problems.    Ned Card ANP/GNP-BC   10/16/2018  10:58 AM

## 2018-10-16 NOTE — Patient Instructions (Signed)
[facility] Discharge Instructions for Patients Receiving Chemotherapy  Today you received the following chemotherapy agents:  Keytruda.  To help prevent nausea and vomiting after your treatment, we encourage you to take your nausea medication as directed.   If you develop nausea and vomiting that is not controlled by your nausea medication, call the clinic.   BELOW ARE SYMPTOMS THAT SHOULD BE REPORTED IMMEDIATELY:  *FEVER GREATER THAN 100.5 F  *CHILLS WITH OR WITHOUT FEVER  NAUSEA AND VOMITING THAT IS NOT CONTROLLED WITH YOUR NAUSEA MEDICATION  *UNUSUAL SHORTNESS OF BREATH  *UNUSUAL BRUISING OR BLEEDING  TENDERNESS IN MOUTH AND THROAT WITH OR WITHOUT PRESENCE OF ULCERS  *URINARY PROBLEMS  *BOWEL PROBLEMS  UNUSUAL RASH Items with * indicate a potential emergency and should be followed up as soon as possible.  Feel free to call the clinic should you have any questions or concerns. The clinic phone number is [phone].  Please show the CHEMO ALERT CARD at check-in to the Emergency Department and triage nurse.    

## 2018-11-01 ENCOUNTER — Other Ambulatory Visit: Payer: Self-pay | Admitting: Oncology

## 2018-11-06 ENCOUNTER — Inpatient Hospital Stay: Payer: Self-pay

## 2018-11-06 ENCOUNTER — Inpatient Hospital Stay (HOSPITAL_BASED_OUTPATIENT_CLINIC_OR_DEPARTMENT_OTHER): Payer: Self-pay | Admitting: Oncology

## 2018-11-06 ENCOUNTER — Telehealth: Payer: Self-pay | Admitting: *Deleted

## 2018-11-06 ENCOUNTER — Inpatient Hospital Stay: Payer: Self-pay | Attending: Oncology

## 2018-11-06 VITALS — BP 120/79 | HR 89 | Temp 98.5°F | Resp 18 | Ht 62.0 in | Wt 152.5 lb

## 2018-11-06 DIAGNOSIS — C189 Malignant neoplasm of colon, unspecified: Secondary | ICD-10-CM

## 2018-11-06 DIAGNOSIS — Z95828 Presence of other vascular implants and grafts: Secondary | ICD-10-CM

## 2018-11-06 DIAGNOSIS — Z5112 Encounter for antineoplastic immunotherapy: Secondary | ICD-10-CM | POA: Insufficient documentation

## 2018-11-06 DIAGNOSIS — E039 Hypothyroidism, unspecified: Secondary | ICD-10-CM

## 2018-11-06 DIAGNOSIS — C180 Malignant neoplasm of cecum: Secondary | ICD-10-CM | POA: Insufficient documentation

## 2018-11-06 LAB — CBC WITH DIFFERENTIAL (CANCER CENTER ONLY)
Abs Immature Granulocytes: 0.01 10*3/uL (ref 0.00–0.07)
BASOS ABS: 0.1 10*3/uL (ref 0.0–0.1)
Basophils Relative: 1 %
Eosinophils Absolute: 0.1 10*3/uL (ref 0.0–0.5)
Eosinophils Relative: 2 %
HCT: 38.8 % (ref 36.0–46.0)
Hemoglobin: 13 g/dL (ref 12.0–15.0)
Immature Granulocytes: 0 %
LYMPHS PCT: 22 %
Lymphs Abs: 1.4 10*3/uL (ref 0.7–4.0)
MCH: 30.2 pg (ref 26.0–34.0)
MCHC: 33.5 g/dL (ref 30.0–36.0)
MCV: 90.2 fL (ref 80.0–100.0)
Monocytes Absolute: 0.5 10*3/uL (ref 0.1–1.0)
Monocytes Relative: 8 %
Neutro Abs: 4.4 10*3/uL (ref 1.7–7.7)
Neutrophils Relative %: 67 %
Platelet Count: 247 10*3/uL (ref 150–400)
RBC: 4.3 MIL/uL (ref 3.87–5.11)
RDW: 12.2 % (ref 11.5–15.5)
WBC Count: 6.5 10*3/uL (ref 4.0–10.5)
nRBC: 0 % (ref 0.0–0.2)

## 2018-11-06 LAB — COMPREHENSIVE METABOLIC PANEL
ALT: 11 U/L (ref 0–44)
AST: 13 U/L — ABNORMAL LOW (ref 15–41)
Albumin: 3.8 g/dL (ref 3.5–5.0)
Alkaline Phosphatase: 57 U/L (ref 38–126)
Anion gap: 6 (ref 5–15)
BUN: 7 mg/dL (ref 6–20)
CALCIUM: 8.8 mg/dL — AB (ref 8.9–10.3)
CO2: 24 mmol/L (ref 22–32)
Chloride: 106 mmol/L (ref 98–111)
Creatinine, Ser: 0.71 mg/dL (ref 0.44–1.00)
GFR calc Af Amer: >60 mL/min (ref >60)
GFR calc non Af Amer: >60 mL/min (ref >60)
Glucose, Bld: 85 mg/dL (ref 70–99)
Potassium: 3.8 mmol/L (ref 3.5–5.1)
Sodium: 136 mmol/L (ref 135–145)
Total Bilirubin: 0.5 mg/dL (ref 0.3–1.2)
Total Protein: 7.1 g/dL (ref 6.5–8.1)

## 2018-11-06 LAB — TSH: TSH: 0.263 u[IU]/mL — ABNORMAL LOW (ref 0.350–4.500)

## 2018-11-06 MED ORDER — SODIUM CHLORIDE 0.9% FLUSH
10.0000 mL | Freq: Once | INTRAVENOUS | Status: AC
  Administered 2018-11-06: 10 mL
  Filled 2018-11-06: qty 10

## 2018-11-06 MED ORDER — SODIUM CHLORIDE 0.9 % IV SOLN
Freq: Once | INTRAVENOUS | Status: AC
  Administered 2018-11-06: 12:00:00 via INTRAVENOUS
  Filled 2018-11-06: qty 250

## 2018-11-06 MED ORDER — SODIUM CHLORIDE 0.9% FLUSH
10.0000 mL | INTRAVENOUS | Status: DC | PRN
  Administered 2018-11-06: 10 mL
  Filled 2018-11-06: qty 10

## 2018-11-06 MED ORDER — LEVOTHYROXINE SODIUM 88 MCG PO TABS: 88.0000 ug | ORAL_TABLET | Freq: Every day | ORAL | 2 refills | Status: DC

## 2018-11-06 MED ORDER — HEPARIN SOD (PORK) LOCK FLUSH 100 UNIT/ML IV SOLN
500.0000 [IU] | Freq: Once | INTRAVENOUS | Status: AC | PRN
  Administered 2018-11-06: 500 [IU]
  Filled 2018-11-06: qty 5

## 2018-11-06 MED ORDER — SODIUM CHLORIDE 0.9 % IV SOLN
200.0000 mg | Freq: Once | INTRAVENOUS | Status: AC
  Administered 2018-11-06: 200 mg via INTRAVENOUS
  Filled 2018-11-06: qty 8

## 2018-11-06 NOTE — Progress Notes (Signed)
[place] OFFICE PROGRESS NOTE   Diagnosis: Colon cancer  INTERVAL HISTORY:   [name] completed another treatment with pembrolizumab on 10/16/2018.  No rash, diarrhea, or joint pain.  No complaint.  She continues thyroid hormone replacement.  Objective:  Vital signs in last 24 hours:  Blood pressure 120/79, pulse 89, temperature 98.5 F (36.9 C), temperature source Oral, resp. rate 18, height 5' 2"  (1.575 m), weight 152 lb 8 oz (69.2 kg), SpO2 100 %.    Resp: Lungs clear bilaterally Cardio: Regular rate and rhythm GI: No hepatosplenomegaly, no mass, nontender Vascular: No leg edema  Skin: No rash  Portacath/PICC-without erythema  Lab Results:  Lab Results  Component Value Date   WBC 6.5 11/06/2018   HGB 13.0 11/06/2018   HCT 38.8 11/06/2018   MCV 90.2 11/06/2018   PLT 247 11/06/2018   NEUTROABS 4.4 11/06/2018    CMP  Lab Results  Component Value Date   NA 136 11/06/2018   K 3.8 11/06/2018   CL 106 11/06/2018   CO2 24 11/06/2018   GLUCOSE 85 11/06/2018   BUN 7 11/06/2018   CREATININE 0.71 11/06/2018   CALCIUM 8.8 (L) 11/06/2018   PROT 7.1 11/06/2018   ALBUMIN 3.8 11/06/2018   AST 13 (L) 11/06/2018   ALT 11 11/06/2018   ALKPHOS 57 11/06/2018   BILITOT 0.5 11/06/2018   GFRNONAA >60 11/06/2018   GFRAA >60 11/06/2018    Lab Results  Component Value Date   CEA1 3.68 10/16/2018     Medications: I have reviewed the patient's current medications.   Assessment/Plan: 1. Stage IV Colon cancer, cecum, presenting with a palpable low abdominal/pelvic mass ? Colonoscopy 03/31/2017 confirmed a cecum mass, biopsy positive for poorly differentiated adenocarcinoma ? CT abdomen/pelvis 03/29/2017-large necrotic right iliac fossa mass, enlarged right common iliac nodes, loculated pelvic ascites, right lower quadrant mesenteric masses ? Right colectomy/right oophorectomy-salpingectomy 04/04/2017 ? Pathology from the right colon resection, grade  3 adenocarcinoma of the cecum, pT4b,pN2b ? Loss of MSH6 expression ? PET scan 04/29/2017-residual tumor at the left adnexa, and internal iliac lymph node, and aortocaval node ? Cycle 1 Pembrolizumab09/07/2017 ? Cycle 2 Pembrolizumab 05/30/2017 ? Cycle 3 Pembrolizumab 06/20/2017 ? Cycle4 Pembrolizumab 07/11/2017 ? Cycle 5 Pembrolizumab 08/04/2017 ? Cycle 6 pembrolizumab 08/22/2017 ? Cycle 7 pembrolizumab 09/12/2017 ? CTs 09/29/2017-previously noted retroperitoneal and right lower quadrant adenopathy has resolved, necrotic pelvic mass not visualized, single enhancing nodule at the umbilicus is nonspecific ? Cycle 8 pembrolizumab 10/03/2017 ? Cycle 9 Pembrolizumab 10/24/2017 ? Cycle 10 Pembrolizumab 11/14/2017 ? Cycle 11 pembrolizumab 12/05/2017 ? Cycle 12 pembrolizumab 12/26/2017 ? Cycle 13 Pembrolizumab 01/16/2018 ? Cycle 14 pembrolizumab 02/06/2018 ? Cycle 15 Pembrolizumab 02/27/2018 ? Cycle 16 Pembrolizumab 03/20/2018 ? CT 04/10/2018-no evidence of recurrent malignancy, stable subcutaneous lesion at the anterior abdominal wall ? Cycle 17 pembrolizumab 04/10/2018 ? Cycle 18 Pembrolizumab 05/01/2018 ? Cycle 19 Pembrolizumab 05/22/2018 ? Cycle 20 Pembrolizumab 06/12/2018 ? Cycle 21 pembrolizumab 07/03/2018 ? Cycle 22 Pembrolizumab 07/24/2018 ? Cycle 23 pembrolizumab 08/14/2018 ? Cycle 24 pembrolizumab 09/04/2018 ? Cycle 25 pembrolizumab 09/25/2018 ? Cycle 26 Pembrolizumab 10/16/2018 ? Cycle 27 pembrolizumab 11/06/2018  2. 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 Anemia-likely iron deficiency anemia secondary to #1.Resolved.  4.G3 P3  5. Appendectomy  5. Maternal aunt with colon cancer  6.Hypothyroid-likely secondary to toxicity from pembrolizumab, thyroid hormone replacement started 08/22/2017  7.Port-A-Cath placement 11/28/2017     Disposition: [name] appears stable.  She will  complete another treatment with pembrolizumab today.   She will return for an office visit and pembrolizumab in 3 weeks.  We will check the CEA when she returns in 3 weeks.  We will follow-up on the TSH from today.  Betsy Coder, MD  11/06/2018  1:53 PM

## 2018-11-06 NOTE — Telephone Encounter (Signed)
Notified patient that TSH has gone down more. Per Dr. Benay Spice, will reduce dose on levothyroxine to 10mcg daily and refill was sent in.

## 2018-11-22 ENCOUNTER — Other Ambulatory Visit: Payer: Self-pay | Admitting: Oncology

## 2018-11-27 ENCOUNTER — Inpatient Hospital Stay: Payer: Self-pay

## 2018-11-27 ENCOUNTER — Inpatient Hospital Stay: Payer: Self-pay | Admitting: Nurse Practitioner

## 2018-11-27 ENCOUNTER — Telehealth: Payer: Self-pay | Admitting: *Deleted

## 2018-11-27 NOTE — Telephone Encounter (Signed)
"  Meagan [name] [phone]).  I'd like to skip today's treatment.  I have three young kids at home and do not want to risk coming out with the COVID - 19.  Have not been exposed or signs or symptoms."  Verbal order received and read back from Dr. Benay Spice it is fine to skip today's treatment.  Cyriah "will come in at my next appointed time"  Appointments canceled.

## 2018-12-13 ENCOUNTER — Other Ambulatory Visit: Payer: Self-pay | Admitting: Oncology

## 2018-12-18 ENCOUNTER — Telehealth: Payer: Self-pay | Admitting: Oncology

## 2018-12-18 ENCOUNTER — Inpatient Hospital Stay (HOSPITAL_BASED_OUTPATIENT_CLINIC_OR_DEPARTMENT_OTHER): Payer: Medicaid Other | Admitting: Oncology

## 2018-12-18 ENCOUNTER — Other Ambulatory Visit: Payer: Self-pay

## 2018-12-18 ENCOUNTER — Inpatient Hospital Stay: Payer: Self-pay

## 2018-12-18 ENCOUNTER — Inpatient Hospital Stay: Payer: Self-pay | Attending: Oncology

## 2018-12-18 VITALS — BP 112/82 | HR 81 | Temp 98.8°F | Resp 18 | Ht 62.0 in | Wt 154.9 lb

## 2018-12-18 DIAGNOSIS — C189 Malignant neoplasm of colon, unspecified: Secondary | ICD-10-CM

## 2018-12-18 DIAGNOSIS — Z5112 Encounter for antineoplastic immunotherapy: Secondary | ICD-10-CM | POA: Insufficient documentation

## 2018-12-18 DIAGNOSIS — E039 Hypothyroidism, unspecified: Secondary | ICD-10-CM

## 2018-12-18 DIAGNOSIS — C180 Malignant neoplasm of cecum: Secondary | ICD-10-CM

## 2018-12-18 DIAGNOSIS — Z79899 Other long term (current) drug therapy: Secondary | ICD-10-CM | POA: Insufficient documentation

## 2018-12-18 DIAGNOSIS — Z95828 Presence of other vascular implants and grafts: Secondary | ICD-10-CM

## 2018-12-18 LAB — CBC WITH DIFFERENTIAL (CANCER CENTER ONLY)
Abs Immature Granulocytes: 0.02 10*3/uL (ref 0.00–0.07)
Basophils Absolute: 0 10*3/uL (ref 0.0–0.1)
Basophils Relative: 1 %
Eosinophils Absolute: 0.1 10*3/uL (ref 0.0–0.5)
Eosinophils Relative: 2 %
HCT: 41.4 % (ref 36.0–46.0)
Hemoglobin: 13.9 g/dL (ref 12.0–15.0)
Immature Granulocytes: 0 %
Lymphocytes Relative: 21 %
Lymphs Abs: 1.5 10*3/uL (ref 0.7–4.0)
MCH: 30 pg (ref 26.0–34.0)
MCHC: 33.6 g/dL (ref 30.0–36.0)
MCV: 89.2 fL (ref 80.0–100.0)
Monocytes Absolute: 0.6 10*3/uL (ref 0.1–1.0)
Monocytes Relative: 8 %
Neutro Abs: 4.8 10*3/uL (ref 1.7–7.7)
Neutrophils Relative %: 68 %
Platelet Count: 235 10*3/uL (ref 150–400)
RBC: 4.64 MIL/uL (ref 3.87–5.11)
RDW: 12.4 % (ref 11.5–15.5)
WBC Count: 7.1 10*3/uL (ref 4.0–10.5)
nRBC: 0 % (ref 0.0–0.2)

## 2018-12-18 LAB — CMP (CANCER CENTER ONLY)
ALT: 8 U/L (ref 0–44)
AST: 13 U/L — ABNORMAL LOW (ref 15–41)
Albumin: 3.9 g/dL (ref 3.5–5.0)
Alkaline Phosphatase: 62 U/L (ref 38–126)
Anion gap: 9 (ref 5–15)
BUN: 7 mg/dL (ref 6–20)
CO2: 23 mmol/L (ref 22–32)
Calcium: 8.9 mg/dL (ref 8.9–10.3)
Chloride: 107 mmol/L (ref 98–111)
Creatinine: 0.78 mg/dL (ref 0.44–1.00)
GFR, Est AFR Am: >60 mL/min (ref >60)
GFR, Estimated: >60 mL/min (ref >60)
Glucose, Bld: 89 mg/dL (ref 70–99)
Potassium: 3.9 mmol/L (ref 3.5–5.1)
Sodium: 139 mmol/L (ref 135–145)
Total Bilirubin: 0.4 mg/dL (ref 0.3–1.2)
Total Protein: 7.8 g/dL (ref 6.5–8.1)

## 2018-12-18 LAB — CEA (IN HOUSE-CHCC): CEA (CHCC-In House): 4.27 ng/mL (ref 0.00–5.00)

## 2018-12-18 LAB — TSH: TSH: 8.658 u[IU]/mL — ABNORMAL HIGH (ref 0.308–3.960)

## 2018-12-18 MED ORDER — SODIUM CHLORIDE 0.9 % IV SOLN
200.0000 mg | Freq: Once | INTRAVENOUS | Status: AC
  Administered 2018-12-18: 200 mg via INTRAVENOUS
  Filled 2018-12-18: qty 8

## 2018-12-18 MED ORDER — SODIUM CHLORIDE 0.9 % IV SOLN
Freq: Once | INTRAVENOUS | Status: AC
  Administered 2018-12-18: 12:00:00 via INTRAVENOUS
  Filled 2018-12-18: qty 250

## 2018-12-18 MED ORDER — HEPARIN SOD (PORK) LOCK FLUSH 100 UNIT/ML IV SOLN
500.0000 [IU] | Freq: Once | INTRAVENOUS | Status: AC | PRN
  Administered 2018-12-18: 500 [IU]
  Filled 2018-12-18: qty 5

## 2018-12-18 MED ORDER — SODIUM CHLORIDE 0.9% FLUSH
10.0000 mL | INTRAVENOUS | Status: DC | PRN
  Administered 2018-12-18: 10 mL
  Filled 2018-12-18: qty 10

## 2018-12-18 MED ORDER — SODIUM CHLORIDE 0.9% FLUSH
10.0000 mL | Freq: Once | INTRAVENOUS | Status: AC
  Administered 2018-12-18: 10 mL
  Filled 2018-12-18: qty 10

## 2018-12-18 NOTE — Progress Notes (Signed)
[place] OFFICE PROGRESS NOTE   Diagnosis: Colon cancer  INTERVAL HISTORY:   [name] returns as scheduled.  She missed the last plan treatment of pembrolizumab secondary to her concerns about the coronavirus.  She feels well.  Good appetite.  No rash or diarrhea.  She is taking thyroid hormone replacement.  Objective:  Vital signs in last 24 hours:  Blood pressure 112/82, pulse 81, temperature 98.8 F (37.1 C), temperature source Oral, resp. rate 18, height 5' 2"  (1.575 m), weight 154 lb 14.4 oz (70.3 kg), SpO2 100 %.      Lab Results:  Lab Results  Component Value Date   WBC 7.1 12/18/2018   HGB 13.9 12/18/2018   HCT 41.4 12/18/2018   MCV 89.2 12/18/2018   PLT 235 12/18/2018   NEUTROABS 4.8 12/18/2018    CMP  Lab Results  Component Value Date   NA 139 12/18/2018   K 3.9 12/18/2018   CL 107 12/18/2018   CO2 23 12/18/2018   GLUCOSE 89 12/18/2018   BUN 7 12/18/2018   CREATININE 0.78 12/18/2018   CALCIUM 8.9 12/18/2018   PROT 7.8 12/18/2018   ALBUMIN 3.9 12/18/2018   AST 13 (L) 12/18/2018   ALT 8 12/18/2018   ALKPHOS 62 12/18/2018   BILITOT 0.4 12/18/2018   GFRNONAA >60 12/18/2018   GFRAA >60 12/18/2018    Lab Results  Component Value Date   CEA1 3.68 10/16/2018     Medications: I have reviewed the patient's current medications.   Assessment/Plan: 1. Stage IV Colon cancer, cecum, presenting with a palpable low abdominal/pelvic mass ? Colonoscopy 03/31/2017 confirmed a cecum mass, biopsy positive for poorly differentiated adenocarcinoma ? CT abdomen/pelvis 03/29/2017-large necrotic right iliac fossa mass, enlarged right common iliac nodes, loculated pelvic ascites, right lower quadrant mesenteric masses ? Right colectomy/right oophorectomy-salpingectomy 04/04/2017 ? Pathology from the right colon resection, grade 3 adenocarcinoma of the cecum, pT4b,pN2b ? Loss of MSH6 expression ? PET scan 04/29/2017-residual tumor at the left  adnexa, and internal iliac lymph node, and aortocaval node ? Cycle 1 Pembrolizumab09/07/2017 ? Cycle 2 Pembrolizumab 05/30/2017 ? Cycle 3 Pembrolizumab 06/20/2017 ? Cycle4 Pembrolizumab 07/11/2017 ? Cycle 5 Pembrolizumab 08/04/2017 ? Cycle 6 pembrolizumab 08/22/2017 ? Cycle 7 pembrolizumab 09/12/2017 ? CTs 09/29/2017-previously noted retroperitoneal and right lower quadrant adenopathy has resolved, necrotic pelvic mass not visualized, single enhancing nodule at the umbilicus is nonspecific ? Cycle 8 pembrolizumab 10/03/2017 ? Cycle 9 Pembrolizumab 10/24/2017 ? Cycle 10 Pembrolizumab 11/14/2017 ? Cycle 11 pembrolizumab 12/05/2017 ? Cycle 12 pembrolizumab 12/26/2017 ? Cycle 13 Pembrolizumab 01/16/2018 ? Cycle 14 pembrolizumab 02/06/2018 ? Cycle 15 Pembrolizumab 02/27/2018 ? Cycle 16 Pembrolizumab 03/20/2018 ? CT 04/10/2018-no evidence of recurrent malignancy, stable subcutaneous lesion at the anterior abdominal wall ? Cycle 17 pembrolizumab 04/10/2018 ? Cycle 18 Pembrolizumab 05/01/2018 ? Cycle 19 Pembrolizumab 05/22/2018 ? Cycle 20 Pembrolizumab 06/12/2018 ? Cycle 21 pembrolizumab 07/03/2018 ? Cycle 22 Pembrolizumab 07/24/2018 ? Cycle 23 pembrolizumab 08/14/2018 ? Cycle 24 pembrolizumab 09/04/2018 ? Cycle 25 pembrolizumab 09/25/2018 ? Cycle 26 Pembrolizumab 10/16/2018 ? Cycle 27 pembrolizumab 11/06/2018 ? Cycle 28 pembrolizumab 12/18/2018  2. 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 Anemia-likely iron deficiency anemia secondary to #1.Resolved.  4.G3 P3  5. Appendectomy  5. Maternal aunt with colon cancer  6.Hypothyroid-likely secondary to toxicity from pembrolizumab, thyroid hormone replacement started 08/22/2017  7.Port-A-Cath placement 11/28/2017      Disposition: [name] appears stable.  We will follow-up on the TSH from  today and adjust the thyroid hormone replacement as indicated. She appears to be tolerating the  pembrolizumab well.  She will complete another treatment today.  She will return for an office visit and pembrolizumab in 3 weeks.  Betsy Coder, MD  12/18/2018  11:37 AM

## 2018-12-18 NOTE — Telephone Encounter (Signed)
Scheduled appt per 4/17 los. °

## 2018-12-18 NOTE — Patient Instructions (Addendum)
[facility] Discharge Instructions for Patients Receiving Chemotherapy  Today you received the following chemotherapy agents:  pembrolizumab Beryle Flock)  To help prevent nausea and vomiting after your treatment, we encourage you to take your nausea medication as prescribed.   If you develop nausea and vomiting that is not controlled by your nausea medication, call the clinic.   BELOW ARE SYMPTOMS THAT SHOULD BE REPORTED IMMEDIATELY:  *FEVER GREATER THAN 100.5 F  *CHILLS WITH OR WITHOUT FEVER  NAUSEA AND VOMITING THAT IS NOT CONTROLLED WITH YOUR NAUSEA MEDICATION  *UNUSUAL SHORTNESS OF BREATH  *UNUSUAL BRUISING OR BLEEDING  TENDERNESS IN MOUTH AND THROAT WITH OR WITHOUT PRESENCE OF ULCERS  *URINARY PROBLEMS  *BOWEL PROBLEMS  UNUSUAL RASH Items with * indicate a potential emergency and should be followed up as soon as possible.  Feel free to call the clinic should you have any questions or concerns. The clinic phone number is [phone].  Please show the [place] at check-in to the Emergency Department and triage nurse.   Coronavirus (COVID-19) Are you at risk?  Are you at risk for the Coronavirus (COVID-19)?  To be considered HIGH RISK for Coronavirus (COVID-19), you have to meet the following criteria:  . Traveled to [place], [place], [place], [place] or [place]; or in the [place] to [place], [place], [place], or [place]; and have fever, cough, and shortness of breath within the last 2 weeks of travel OR . Been in close contact with a person diagnosed with COVID-19 within the last 2 weeks and have fever, cough, and shortness of breath . IF YOU DO NOT MEET THESE CRITERIA, YOU ARE CONSIDERED LOW RISK FOR COVID-19.  What to do if you are HIGH RISK for COVID-19?  Marland Kitchen If you are having a medical emergency, call 911. . Seek medical care right away. Before you go to a doctor's office, urgent care or emergency department, call ahead and tell  them about your recent travel, contact with someone diagnosed with COVID-19, and your symptoms. You should receive instructions from your physician's office regarding next steps of care.  . When you arrive at healthcare provider, tell the healthcare staff immediately you have returned from visiting [place], [place], [place], [place] or [place]; or traveled in the [place] to [place], [place], [place], or [place]; in the last two weeks or you have been in close contact with a person diagnosed with COVID-19 in the last 2 weeks.   . Tell the health care staff about your symptoms: fever, cough and shortness of breath. . After you have been seen by a medical provider, you will be either: o Tested for (COVID-19) and discharged home on quarantine except to seek medical care if symptoms worsen, and asked to  - Stay home and avoid contact with others until you get your results (4-5 days)  - Avoid travel on public transportation if possible (such as bus, train, or airplane) or o Sent to the Emergency Department by EMS for evaluation, COVID-19 testing, and possible admission depending on your condition and test results.  What to do if you are LOW RISK for COVID-19?  Reduce your risk of any infection by using the same precautions used for avoiding the common cold or flu:  Marland Kitchen Wash your hands often with soap and warm water for at least 20 seconds.  If soap and water are not readily available, use an alcohol-based hand sanitizer with at least 60% alcohol.  . If  coughing or sneezing, cover your mouth and nose by coughing or sneezing into the elbow areas of your shirt or coat, into a tissue or into your sleeve (not your hands). . Avoid shaking hands with others and consider head nods or verbal greetings only. . Avoid touching your eyes, nose, or mouth with unwashed hands.  . Avoid close contact with people who are sick. . Avoid places or events with large numbers of people in one location, like concerts or  sporting events. . Carefully consider travel plans you have or are making. . If you are planning any travel outside or inside the [place], visit the CDC's Travelers' Health webpage for the latest health notices. . If you have some symptoms but not all symptoms, continue to monitor at home and seek medical attention if your symptoms worsen. . If you are having a medical emergency, call 911.   ADDITIONAL HEALTHCARE OPTIONS FOR PATIENTS  [facility] / e-Visit: [email or web address]         [facility]: [phone]  [facility]: [phone]                   [facility]: [phone]   

## 2019-01-03 ENCOUNTER — Other Ambulatory Visit: Payer: Self-pay | Admitting: Oncology

## 2019-01-08 ENCOUNTER — Inpatient Hospital Stay (HOSPITAL_BASED_OUTPATIENT_CLINIC_OR_DEPARTMENT_OTHER): Payer: Self-pay | Admitting: Nurse Practitioner

## 2019-01-08 ENCOUNTER — Other Ambulatory Visit: Payer: Self-pay | Admitting: *Deleted

## 2019-01-08 ENCOUNTER — Telehealth: Payer: Self-pay | Admitting: Nurse Practitioner

## 2019-01-08 ENCOUNTER — Encounter: Payer: Self-pay | Admitting: Nurse Practitioner

## 2019-01-08 ENCOUNTER — Inpatient Hospital Stay: Payer: Self-pay | Attending: Oncology

## 2019-01-08 ENCOUNTER — Inpatient Hospital Stay: Payer: Self-pay

## 2019-01-08 ENCOUNTER — Other Ambulatory Visit: Payer: Self-pay

## 2019-01-08 VITALS — BP 110/82 | HR 72 | Temp 98.7°F | Resp 17 | Ht 62.0 in | Wt 152.3 lb

## 2019-01-08 DIAGNOSIS — C189 Malignant neoplasm of colon, unspecified: Secondary | ICD-10-CM

## 2019-01-08 DIAGNOSIS — Z5112 Encounter for antineoplastic immunotherapy: Secondary | ICD-10-CM | POA: Insufficient documentation

## 2019-01-08 DIAGNOSIS — C180 Malignant neoplasm of cecum: Secondary | ICD-10-CM

## 2019-01-08 DIAGNOSIS — Z95828 Presence of other vascular implants and grafts: Secondary | ICD-10-CM

## 2019-01-08 LAB — CBC WITH DIFFERENTIAL (CANCER CENTER ONLY)
Abs Immature Granulocytes: 0.01 10*3/uL (ref 0.00–0.07)
Basophils Absolute: 0 10*3/uL (ref 0.0–0.1)
Basophils Relative: 0 %
Eosinophils Absolute: 0.1 10*3/uL (ref 0.0–0.5)
Eosinophils Relative: 1 %
HCT: 41.4 % (ref 36.0–46.0)
Hemoglobin: 13.7 g/dL (ref 12.0–15.0)
Immature Granulocytes: 0 %
Lymphocytes Relative: 17 %
Lymphs Abs: 1.2 10*3/uL (ref 0.7–4.0)
MCH: 29.5 pg (ref 26.0–34.0)
MCHC: 33.1 g/dL (ref 30.0–36.0)
MCV: 89 fL (ref 80.0–100.0)
Monocytes Absolute: 0.5 10*3/uL (ref 0.1–1.0)
Monocytes Relative: 8 %
Neutro Abs: 5.3 10*3/uL (ref 1.7–7.7)
Neutrophils Relative %: 74 %
Platelet Count: 215 10*3/uL (ref 150–400)
RBC: 4.65 MIL/uL (ref 3.87–5.11)
RDW: 12.4 % (ref 11.5–15.5)
WBC Count: 7.1 10*3/uL (ref 4.0–10.5)
nRBC: 0 % (ref 0.0–0.2)

## 2019-01-08 LAB — CMP (CANCER CENTER ONLY)
ALT: 9 U/L (ref 0–44)
AST: 12 U/L — ABNORMAL LOW (ref 15–41)
Albumin: 3.9 g/dL (ref 3.5–5.0)
Alkaline Phosphatase: 66 U/L (ref 38–126)
Anion gap: 7 (ref 5–15)
BUN: 6 mg/dL (ref 6–20)
CO2: 25 mmol/L (ref 22–32)
Calcium: 8.8 mg/dL — ABNORMAL LOW (ref 8.9–10.3)
Chloride: 107 mmol/L (ref 98–111)
Creatinine: 0.75 mg/dL (ref 0.44–1.00)
GFR, Est AFR Am: >60 mL/min (ref >60)
GFR, Estimated: >60 mL/min (ref >60)
Glucose, Bld: 81 mg/dL (ref 70–99)
Potassium: 4 mmol/L (ref 3.5–5.1)
Sodium: 139 mmol/L (ref 135–145)
Total Bilirubin: 0.6 mg/dL (ref 0.3–1.2)
Total Protein: 7.7 g/dL (ref 6.5–8.1)

## 2019-01-08 MED ORDER — HEPARIN SOD (PORK) LOCK FLUSH 100 UNIT/ML IV SOLN
500.0000 [IU] | Freq: Once | INTRAVENOUS | Status: AC | PRN
  Administered 2019-01-08: 13:00:00 500 [IU]
  Filled 2019-01-08: qty 5

## 2019-01-08 MED ORDER — SODIUM CHLORIDE 0.9% FLUSH
10.0000 mL | Freq: Once | INTRAVENOUS | Status: AC
  Administered 2019-01-08: 10 mL
  Filled 2019-01-08: qty 10

## 2019-01-08 MED ORDER — SODIUM CHLORIDE 0.9 % IV SOLN
200.0000 mg | Freq: Once | INTRAVENOUS | Status: AC
  Administered 2019-01-08: 12:00:00 200 mg via INTRAVENOUS
  Filled 2019-01-08: qty 8

## 2019-01-08 MED ORDER — SODIUM CHLORIDE 0.9% FLUSH
10.0000 mL | INTRAVENOUS | Status: DC | PRN
  Administered 2019-01-08: 13:00:00 10 mL
  Filled 2019-01-08: qty 10

## 2019-01-08 MED ORDER — SODIUM CHLORIDE 0.9 % IV SOLN
Freq: Once | INTRAVENOUS | Status: AC
  Administered 2019-01-08: 12:00:00 via INTRAVENOUS
  Filled 2019-01-08: qty 250

## 2019-01-08 NOTE — Telephone Encounter (Signed)
Scheduled appt per 5/8 los. °

## 2019-01-08 NOTE — Patient Instructions (Signed)
[facility] Discharge Instructions for Patients Receiving Chemotherapy  Today you received the following chemotherapy agents:  pembrolizumab Beryle Flock)  To help prevent nausea and vomiting after your treatment, we encourage you to take your nausea medication as prescribed.   If you develop nausea and vomiting that is not controlled by your nausea medication, call the clinic.   BELOW ARE SYMPTOMS THAT SHOULD BE REPORTED IMMEDIATELY:  *FEVER GREATER THAN 100.5 F  *CHILLS WITH OR WITHOUT FEVER  NAUSEA AND VOMITING THAT IS NOT CONTROLLED WITH YOUR NAUSEA MEDICATION  *UNUSUAL SHORTNESS OF BREATH  *UNUSUAL BRUISING OR BLEEDING  TENDERNESS IN MOUTH AND THROAT WITH OR WITHOUT PRESENCE OF ULCERS  *URINARY PROBLEMS  *BOWEL PROBLEMS  UNUSUAL RASH Items with * indicate a potential emergency and should be followed up as soon as possible.  Feel free to call the clinic should you have any questions or concerns. The clinic phone number is [phone].  Please show the [place] [facility] at check-in to the Emergency Department and triage nurse.   Coronavirus (COVID-19) Are you at risk?  Are you at risk for the Coronavirus (COVID-19)?  To be considered HIGH RISK for Coronavirus (COVID-19), you have to meet the following criteria:  . Traveled to [place], [place], [place], [place] or [place]; or in the [place] to [place], [place], [place], or [place]; and have fever, cough, and shortness of breath within the last 2 weeks of travel OR . Been in close contact with a person diagnosed with COVID-19 within the last 2 weeks and have fever, cough, and shortness of breath . IF YOU DO NOT MEET THESE CRITERIA, YOU ARE CONSIDERED LOW RISK FOR COVID-19.  What to do if you are HIGH RISK for COVID-19?  Marland Kitchen If you are having a medical emergency, call 911. . Seek medical care right away. Before you go to a doctor's office, urgent care or emergency department, call ahead and tell  them about your recent travel, contact with someone diagnosed with COVID-19, and your symptoms. You should receive instructions from your physician's office regarding next steps of care.  . When you arrive at healthcare provider, tell the healthcare staff immediately you have returned from visiting [place], [place], [place], [place] or [place]; or traveled in the [place] to [place], [place], [place], or [place]; in the last two weeks or you have been in close contact with a person diagnosed with COVID-19 in the last 2 weeks.   . Tell the health care staff about your symptoms: fever, cough and shortness of breath. . After you have been seen by a medical provider, you will be either: o Tested for (COVID-19) and discharged home on quarantine except to seek medical care if symptoms worsen, and asked to  - Stay home and avoid contact with others until you get your results (4-5 days)  - Avoid travel on public transportation if possible (such as bus, train, or airplane) or o Sent to the Emergency Department by EMS for evaluation, COVID-19 testing, and possible admission depending on your condition and test results.  What to do if you are LOW RISK for COVID-19?  Reduce your risk of any infection by using the same precautions used for avoiding the common cold or flu:  Marland Kitchen Wash your hands often with soap and warm water for at least 20 seconds.  If soap and water are not readily available, use an alcohol-based hand sanitizer with at least 60% alcohol.  . If  coughing or sneezing, cover your mouth and nose by coughing or sneezing into the elbow areas of your shirt or coat, into a tissue or into your sleeve (not your hands). . Avoid shaking hands with others and consider head nods or verbal greetings only. . Avoid touching your eyes, nose, or mouth with unwashed hands.  . Avoid close contact with people who are sick. . Avoid places or events with large numbers of people in one location, like concerts or  sporting events. . Carefully consider travel plans you have or are making. . If you are planning any travel outside or inside the [place], visit the CDC's Travelers' Health webpage for the latest health notices. . If you have some symptoms but not all symptoms, continue to monitor at home and seek medical attention if your symptoms worsen. . If you are having a medical emergency, call 911.   ADDITIONAL HEALTHCARE OPTIONS FOR PATIENTS  [facility] / e-Visit: [email or web address]         [facility]: [phone]  [facility]: [phone]                   [facility]: [phone]   

## 2019-01-08 NOTE — Progress Notes (Signed)
  [facility] OFFICE PROGRESS NOTE   Diagnosis: Colon cancer  INTERVAL HISTORY:   Ms. Hillenburg returns as scheduled.  She completed a cycle of pembrolizumab 12/18/2018.  She feels well.  She denies nausea/vomiting.  No mouth sores.  No diarrhea.  No rash.  No fever, cough, shortness of breath.  Objective:  Vital signs in last 24 hours:  Blood pressure 110/82, pulse 72, temperature 98.7 F (37.1 C), temperature source Oral, resp. rate 17, height '5\' 2"'$  (1.575 m), weight 152 lb 4.8 oz (69.1 kg), SpO2 99 %.    Port-A-Cath without erythema.  Lab Results:  Lab Results  Component Value Date   WBC 7.1 01/08/2019   HGB 13.7 01/08/2019   HCT 41.4 01/08/2019   MCV 89.0 01/08/2019   PLT 215 01/08/2019   NEUTROABS 5.3 01/08/2019    Imaging:  No results found.  Medications: I have reviewed the patient's current medications.  Assessment/Plan: 1. Stage IV Colon cancer, cecum, presenting with a palpable low abdominal/pelvic mass ? Colonoscopy 03/31/2017 confirmed a cecum mass, biopsy positive for poorly differentiated adenocarcinoma ? CT abdomen/pelvis 03/29/2017-large necrotic right iliac fossa mass, enlarged right common iliac nodes, loculated pelvic ascites, right lower quadrant mesenteric masses ? Right colectomy/right oophorectomy-salpingectomy 04/04/2017 ? Pathology from the right colon resection, grade 3 adenocarcinoma of the cecum, pT4b,pN2b ? Loss of MSH6 expression ? PET scan 04/29/2017-residual tumor at the left adnexa, and internal iliac lymph node, and aortocaval node ? Cycle 1 Pembrolizumab09/07/2017 ? Cycle 2 Pembrolizumab 05/30/2017 ? Cycle 3 Pembrolizumab 06/20/2017 ? Cycle4 Pembrolizumab 07/11/2017 ? Cycle 5 Pembrolizumab 08/04/2017 ? Cycle 6 pembrolizumab 08/22/2017 ? Cycle 7 pembrolizumab 09/12/2017 ? CTs 09/29/2017-previously noted retroperitoneal and right lower quadrant adenopathy has resolved, necrotic pelvic mass not visualized, single enhancing  nodule at the umbilicus is nonspecific ? Cycle 8 pembrolizumab 10/03/2017 ? Cycle 9 Pembrolizumab 10/24/2017 ? Cycle 10 Pembrolizumab 11/14/2017 ? Cycle 11 pembrolizumab 12/05/2017 ? Cycle 12 pembrolizumab 12/26/2017 ? Cycle 13 Pembrolizumab 01/16/2018 ? Cycle 14 pembrolizumab 02/06/2018 ? Cycle 15 Pembrolizumab 02/27/2018 ? Cycle 16 Pembrolizumab 03/20/2018 ? CT 04/10/2018-no evidence of recurrent malignancy, stable subcutaneous lesion at the anterior abdominal wall ? Cycle 17 pembrolizumab 04/10/2018 ? Cycle 18 Pembrolizumab 05/01/2018 ? Cycle 19 Pembrolizumab 05/22/2018 ? Cycle 20 Pembrolizumab 06/12/2018 ? Cycle 21 pembrolizumab 07/03/2018 ? Cycle 22 Pembrolizumab 07/24/2018 ? Cycle 23 pembrolizumab 08/14/2018 ? Cycle 24 pembrolizumab 09/04/2018 ? Cycle 25 pembrolizumab 09/25/2018 ? Cycle 26 Pembrolizumab 10/16/2018 ? Cycle 27 pembrolizumab 11/06/2018 ? Cycle 28 pembrolizumab 12/18/2018 ? Cycle 29 Pembrolizumab 01/08/2019  2. 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 Anemia-likely iron deficiency anemia secondary to #1.Resolved.  4.G3 P3  5. Appendectomy  5. Maternal aunt with colon cancer  6.Hypothyroid-likely secondary to toxicity from pembrolizumab, thyroid hormone replacement started 08/22/2017  7.Port-A-Cath placement 11/28/2017    Disposition: Ms. Swanton appears stable.  She continues pembrolizumab every 3 weeks.  Plan to proceed with cycle 30 today as scheduled.  She will return for lab, follow-up and the next cycle in 3 weeks.  She will contact the office in the interim with any problems.    Ned Card ANP/GNP-BC   01/08/2019  10:56 AM

## 2019-01-29 ENCOUNTER — Telehealth: Payer: Self-pay | Admitting: Nurse Practitioner

## 2019-01-29 ENCOUNTER — Inpatient Hospital Stay: Payer: Self-pay

## 2019-01-29 ENCOUNTER — Other Ambulatory Visit: Payer: Self-pay | Admitting: Nurse Practitioner

## 2019-01-29 ENCOUNTER — Encounter: Payer: Self-pay | Admitting: Nurse Practitioner

## 2019-01-29 ENCOUNTER — Inpatient Hospital Stay (HOSPITAL_BASED_OUTPATIENT_CLINIC_OR_DEPARTMENT_OTHER): Payer: Self-pay | Admitting: Nurse Practitioner

## 2019-01-29 ENCOUNTER — Other Ambulatory Visit: Payer: Self-pay

## 2019-01-29 VITALS — BP 116/79 | HR 77 | Temp 98.7°F | Resp 18 | Ht 62.0 in | Wt 155.1 lb

## 2019-01-29 DIAGNOSIS — Z95828 Presence of other vascular implants and grafts: Secondary | ICD-10-CM

## 2019-01-29 DIAGNOSIS — C180 Malignant neoplasm of cecum: Secondary | ICD-10-CM

## 2019-01-29 DIAGNOSIS — C189 Malignant neoplasm of colon, unspecified: Secondary | ICD-10-CM

## 2019-01-29 LAB — CMP (CANCER CENTER ONLY)
ALT: 10 U/L (ref 0–44)
AST: 13 U/L — ABNORMAL LOW (ref 15–41)
Albumin: 3.7 g/dL (ref 3.5–5.0)
Alkaline Phosphatase: 66 U/L (ref 38–126)
Anion gap: 6 (ref 5–15)
BUN: 7 mg/dL (ref 6–20)
CO2: 25 mmol/L (ref 22–32)
Calcium: 8.8 mg/dL — ABNORMAL LOW (ref 8.9–10.3)
Chloride: 107 mmol/L (ref 98–111)
Creatinine: 0.74 mg/dL (ref 0.44–1.00)
GFR, Est AFR Am: >60 mL/min (ref >60)
GFR, Estimated: >60 mL/min (ref >60)
Glucose, Bld: 89 mg/dL (ref 70–99)
Potassium: 3.7 mmol/L (ref 3.5–5.1)
Sodium: 138 mmol/L (ref 135–145)
Total Bilirubin: 0.4 mg/dL (ref 0.3–1.2)
Total Protein: 7.4 g/dL (ref 6.5–8.1)

## 2019-01-29 LAB — CBC WITH DIFFERENTIAL (CANCER CENTER ONLY)
Abs Immature Granulocytes: 0.01 10*3/uL (ref 0.00–0.07)
Basophils Absolute: 0 10*3/uL (ref 0.0–0.1)
Basophils Relative: 1 %
Eosinophils Absolute: 0.1 10*3/uL (ref 0.0–0.5)
Eosinophils Relative: 1 %
HCT: 40.4 % (ref 36.0–46.0)
Hemoglobin: 13.5 g/dL (ref 12.0–15.0)
Immature Granulocytes: 0 %
Lymphocytes Relative: 21 %
Lymphs Abs: 1.5 10*3/uL (ref 0.7–4.0)
MCH: 30.4 pg (ref 26.0–34.0)
MCHC: 33.4 g/dL (ref 30.0–36.0)
MCV: 91 fL (ref 80.0–100.0)
Monocytes Absolute: 0.5 10*3/uL (ref 0.1–1.0)
Monocytes Relative: 8 %
Neutro Abs: 4.9 10*3/uL (ref 1.7–7.7)
Neutrophils Relative %: 69 %
Platelet Count: 239 10*3/uL (ref 150–400)
RBC: 4.44 MIL/uL (ref 3.87–5.11)
RDW: 12.6 % (ref 11.5–15.5)
WBC Count: 7.1 10*3/uL (ref 4.0–10.5)
nRBC: 0 % (ref 0.0–0.2)

## 2019-01-29 LAB — TSH: TSH: 3.966 u[IU]/mL — ABNORMAL HIGH (ref 0.308–3.960)

## 2019-01-29 MED ORDER — SODIUM CHLORIDE 0.9% FLUSH
10.0000 mL | INTRAVENOUS | Status: DC | PRN
  Administered 2019-01-29: 13:00:00 10 mL
  Filled 2019-01-29: qty 10

## 2019-01-29 MED ORDER — SODIUM CHLORIDE 0.9 % IV SOLN
200.0000 mg | Freq: Once | INTRAVENOUS | Status: AC
  Administered 2019-01-29: 12:00:00 200 mg via INTRAVENOUS
  Filled 2019-01-29: qty 8

## 2019-01-29 MED ORDER — SODIUM CHLORIDE 0.9 % IV SOLN
Freq: Once | INTRAVENOUS | Status: AC
  Administered 2019-01-29: 11:00:00 via INTRAVENOUS
  Filled 2019-01-29: qty 250

## 2019-01-29 MED ORDER — SODIUM CHLORIDE 0.9% FLUSH
10.0000 mL | Freq: Once | INTRAVENOUS | Status: AC
  Administered 2019-01-29: 10 mL
  Filled 2019-01-29: qty 10

## 2019-01-29 MED ORDER — LEVOTHYROXINE SODIUM 88 MCG PO TABS: 88.0000 ug | ORAL_TABLET | Freq: Every day | ORAL | 2 refills | Status: DC

## 2019-01-29 MED ORDER — HEPARIN SOD (PORK) LOCK FLUSH 100 UNIT/ML IV SOLN
500.0000 [IU] | Freq: Once | INTRAVENOUS | Status: AC | PRN
  Administered 2019-01-29: 500 [IU]
  Filled 2019-01-29: qty 5

## 2019-01-29 NOTE — Progress Notes (Signed)
  [facility] OFFICE PROGRESS NOTE   Diagnosis: Colon cancer  INTERVAL HISTORY:   Meagan [name] returns as scheduled.  She completed another cycle of pembrolizumab 01/08/2019.  She denies nausea/vomiting.  No mouth sores.  No diarrhea.  No rash.  No fever, cough, shortness of breath.  No bleeding.  Objective:  Vital signs in last 24 hours:  Blood pressure 116/79, pulse 77, temperature 98.7 F (37.1 C), temperature source Oral, resp. rate 18, height '5\' 2"'$  (1.575 m), weight 155 lb 1.6 oz (70.4 kg), SpO2 100 %.    Vascular: No leg edema.  Skin: No rash. Port-A-Cath without erythema.   Lab Results:  Lab Results  Component Value Date   WBC 7.1 01/29/2019   HGB 13.5 01/29/2019   HCT 40.4 01/29/2019   MCV 91.0 01/29/2019   PLT 239 01/29/2019   NEUTROABS 4.9 01/29/2019    Imaging:  No results found.  Medications: I have reviewed the patient's current medications.  Assessment/Plan: 1. Stage IV Colon cancer, cecum, presenting with a palpable low abdominal/pelvic mass ? Colonoscopy 03/31/2017 confirmed a cecum mass, biopsy positive for poorly differentiated adenocarcinoma ? CT abdomen/pelvis 03/29/2017-large necrotic right iliac fossa mass, enlarged right common iliac nodes, loculated pelvic ascites, right lower quadrant mesenteric masses ? Right colectomy/right oophorectomy-salpingectomy 04/04/2017 ? Pathology from the right colon resection, grade 3 adenocarcinoma of the cecum, pT4b,pN2b ? Loss of MSH6 expression ? PET scan 04/29/2017-residual tumor at the left adnexa, and internal iliac lymph node, and aortocaval node ? Cycle 1 Pembrolizumab09/07/2017 ? Cycle 2 Pembrolizumab 05/30/2017 ? Cycle 3 Pembrolizumab 06/20/2017 ? Cycle4 Pembrolizumab 07/11/2017 ? Cycle 5 Pembrolizumab 08/04/2017 ? Cycle 6 pembrolizumab 08/22/2017 ? Cycle 7 pembrolizumab 09/12/2017 ? CTs 09/29/2017-previously noted retroperitoneal and right lower quadrant adenopathy has resolved,  necrotic pelvic mass not visualized, single enhancing nodule at the umbilicus is nonspecific ? Cycle 8 pembrolizumab 10/03/2017 ? Cycle 9 Pembrolizumab 10/24/2017 ? Cycle 10 Pembrolizumab 11/14/2017 ? Cycle 11 pembrolizumab 12/05/2017 ? Cycle 12 pembrolizumab 12/26/2017 ? Cycle 13 Pembrolizumab 01/16/2018 ? Cycle 14 pembrolizumab 02/06/2018 ? Cycle 15 Pembrolizumab 02/27/2018 ? Cycle 16 Pembrolizumab 03/20/2018 ? CT 04/10/2018-no evidence of recurrent malignancy, stable subcutaneous lesion at the anterior abdominal wall ? Cycle 17 pembrolizumab 04/10/2018 ? Cycle 18 Pembrolizumab 05/01/2018 ? Cycle 19 Pembrolizumab 05/22/2018 ? Cycle 20 Pembrolizumab 06/12/2018 ? Cycle 21 pembrolizumab 07/03/2018 ? Cycle 22 Pembrolizumab 07/24/2018 ? Cycle 23 pembrolizumab 08/14/2018 ? Cycle 24 pembrolizumab 09/04/2018 ? Cycle 25 pembrolizumab 09/25/2018 ? Cycle 26 Pembrolizumab 10/16/2018 ? Cycle 27 pembrolizumab 11/06/2018 ? Cycle 28 pembrolizumab 12/18/2018 ? Cycle 29 Pembrolizumab 01/08/2019 ? Cycle 30 Pembrolizumab 01/29/2019  2. 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 Anemia-likely iron deficiency anemia secondary to #1.Resolved.  4.G3 P3  5. Appendectomy  5. Maternal aunt with colon cancer  6.Hypothyroid-likely secondary to toxicity from pembrolizumab, thyroid hormone replacement started 08/22/2017  7.Port-A-Cath placement 11/28/2017  Disposition: Meagan [name] appears well.  She has completed 29 cycles of pembrolizumab.  Plan to proceed with cycle 30 today as scheduled.  We reviewed the CBC from today.  Counts are adequate for treatment.  We will follow-up on the TSH and adjust thyroid hormone replacement as indicated.  She will return for lab, follow-up, pembrolizumab in 3 weeks.    Ned Card ANP/GNP-BC   01/29/2019  10:55 AM

## 2019-01-29 NOTE — Patient Instructions (Signed)
[facility] Discharge Instructions for Patients Receiving Chemotherapy  Today you received the following chemotherapy agents:  Keytruda.  To help prevent nausea and vomiting after your treatment, we encourage you to take your nausea medication as directed.   If you develop nausea and vomiting that is not controlled by your nausea medication, call the clinic.   BELOW ARE SYMPTOMS THAT SHOULD BE REPORTED IMMEDIATELY:  *FEVER GREATER THAN 100.5 F  *CHILLS WITH OR WITHOUT FEVER  NAUSEA AND VOMITING THAT IS NOT CONTROLLED WITH YOUR NAUSEA MEDICATION  *UNUSUAL SHORTNESS OF BREATH  *UNUSUAL BRUISING OR BLEEDING  TENDERNESS IN MOUTH AND THROAT WITH OR WITHOUT PRESENCE OF ULCERS  *URINARY PROBLEMS  *BOWEL PROBLEMS  UNUSUAL RASH Items with * indicate a potential emergency and should be followed up as soon as possible.  Feel free to call the clinic should you have any questions or concerns. The clinic phone number is [phone].  Please show the CHEMO ALERT CARD at check-in to the Emergency Department and triage nurse.    

## 2019-01-29 NOTE — Telephone Encounter (Signed)
Scheduled appt per 5/29 los. °

## 2019-02-16 ENCOUNTER — Other Ambulatory Visit: Payer: Self-pay | Admitting: Oncology

## 2019-02-19 ENCOUNTER — Encounter: Payer: Self-pay | Admitting: Nurse Practitioner

## 2019-02-19 ENCOUNTER — Inpatient Hospital Stay (HOSPITAL_BASED_OUTPATIENT_CLINIC_OR_DEPARTMENT_OTHER): Payer: Self-pay | Admitting: Nurse Practitioner

## 2019-02-19 ENCOUNTER — Inpatient Hospital Stay: Payer: Self-pay

## 2019-02-19 ENCOUNTER — Inpatient Hospital Stay: Payer: Self-pay | Attending: Oncology

## 2019-02-19 ENCOUNTER — Other Ambulatory Visit: Payer: Self-pay

## 2019-02-19 VITALS — BP 121/78 | HR 80 | Temp 98.5°F | Resp 18 | Ht 62.0 in | Wt 151.7 lb

## 2019-02-19 DIAGNOSIS — C189 Malignant neoplasm of colon, unspecified: Secondary | ICD-10-CM

## 2019-02-19 DIAGNOSIS — Z95828 Presence of other vascular implants and grafts: Secondary | ICD-10-CM

## 2019-02-19 DIAGNOSIS — C180 Malignant neoplasm of cecum: Secondary | ICD-10-CM

## 2019-02-19 DIAGNOSIS — E039 Hypothyroidism, unspecified: Secondary | ICD-10-CM | POA: Insufficient documentation

## 2019-02-19 DIAGNOSIS — Z5112 Encounter for antineoplastic immunotherapy: Secondary | ICD-10-CM | POA: Insufficient documentation

## 2019-02-19 LAB — CMP (CANCER CENTER ONLY)
ALT: 8 U/L (ref 0–44)
AST: 12 U/L — ABNORMAL LOW (ref 15–41)
Albumin: 3.9 g/dL (ref 3.5–5.0)
Alkaline Phosphatase: 62 U/L (ref 38–126)
Anion gap: 8 (ref 5–15)
BUN: 8 mg/dL (ref 6–20)
CO2: 24 mmol/L (ref 22–32)
Calcium: 8.9 mg/dL (ref 8.9–10.3)
Chloride: 105 mmol/L (ref 98–111)
Creatinine: 0.77 mg/dL (ref 0.44–1.00)
GFR, Est AFR Am: >60 mL/min (ref >60)
GFR, Estimated: >60 mL/min (ref >60)
Glucose, Bld: 80 mg/dL (ref 70–99)
Potassium: 4 mmol/L (ref 3.5–5.1)
Sodium: 137 mmol/L (ref 135–145)
Total Bilirubin: 0.4 mg/dL (ref 0.3–1.2)
Total Protein: 7.6 g/dL (ref 6.5–8.1)

## 2019-02-19 LAB — CBC WITH DIFFERENTIAL (CANCER CENTER ONLY)
Abs Immature Granulocytes: 0.02 10*3/uL (ref 0.00–0.07)
Basophils Absolute: 0 10*3/uL (ref 0.0–0.1)
Basophils Relative: 0 %
Eosinophils Absolute: 0.1 10*3/uL (ref 0.0–0.5)
Eosinophils Relative: 2 %
HCT: 41 % (ref 36.0–46.0)
Hemoglobin: 13.7 g/dL (ref 12.0–15.0)
Immature Granulocytes: 0 %
Lymphocytes Relative: 19 %
Lymphs Abs: 1.3 10*3/uL (ref 0.7–4.0)
MCH: 30.5 pg (ref 26.0–34.0)
MCHC: 33.4 g/dL (ref 30.0–36.0)
MCV: 91.3 fL (ref 80.0–100.0)
Monocytes Absolute: 0.6 10*3/uL (ref 0.1–1.0)
Monocytes Relative: 9 %
Neutro Abs: 4.9 10*3/uL (ref 1.7–7.7)
Neutrophils Relative %: 70 %
Platelet Count: 217 10*3/uL (ref 150–400)
RBC: 4.49 MIL/uL (ref 3.87–5.11)
RDW: 12.6 % (ref 11.5–15.5)
WBC Count: 7 10*3/uL (ref 4.0–10.5)
nRBC: 0 % (ref 0.0–0.2)

## 2019-02-19 LAB — CEA (IN HOUSE-CHCC): CEA (CHCC-In House): 3.85 ng/mL (ref 0.00–5.00)

## 2019-02-19 LAB — TSH: TSH: 3.346 u[IU]/mL (ref 0.308–3.960)

## 2019-02-19 MED ORDER — HEPARIN SOD (PORK) LOCK FLUSH 100 UNIT/ML IV SOLN
500.0000 [IU] | Freq: Once | INTRAVENOUS | Status: AC | PRN
  Administered 2019-02-19: 500 [IU]
  Filled 2019-02-19: qty 5

## 2019-02-19 MED ORDER — SODIUM CHLORIDE 0.9% FLUSH
10.0000 mL | Freq: Once | INTRAVENOUS | Status: AC
  Administered 2019-02-19: 10 mL
  Filled 2019-02-19: qty 10

## 2019-02-19 MED ORDER — SODIUM CHLORIDE 0.9 % IV SOLN
Freq: Once | INTRAVENOUS | Status: AC
  Administered 2019-02-19: 11:00:00 via INTRAVENOUS
  Filled 2019-02-19: qty 250

## 2019-02-19 MED ORDER — SODIUM CHLORIDE 0.9 % IV SOLN
200.0000 mg | Freq: Once | INTRAVENOUS | Status: AC
  Administered 2019-02-19: 200 mg via INTRAVENOUS
  Filled 2019-02-19: qty 8

## 2019-02-19 MED ORDER — SODIUM CHLORIDE 0.9% FLUSH
10.0000 mL | INTRAVENOUS | Status: DC | PRN
  Administered 2019-02-19: 10 mL
  Filled 2019-02-19: qty 10

## 2019-02-19 NOTE — Progress Notes (Signed)
[facility] OFFICE PROGRESS NOTE   Diagnosis: Colon cancer  INTERVAL HISTORY:   Meagan [name] returns as scheduled.  She completed another cycle of pembrolizumab 01/29/2019.  She has no complaints today.  She has a good appetite.  No nausea or vomiting.  No mouth sores.  No diarrhea.  No rash.  No fever, cough, shortness of breath.  No pain.  Objective:  Vital signs in last 24 hours:  Blood pressure 121/78, pulse 80, temperature 98.5 F (36.9 C), temperature source Oral, resp. rate 18, height 5' 2" (1.575 m), weight 151 lb 11.2 oz (68.8 kg), SpO2 100 %.    HEENT: Mild white coating over tongue.  No buccal thrush. GI: Abdomen soft and nontender.  No hepatomegaly.  No mass. Vascular: No leg edema.  Calves soft and nontender. Neuro: Alert and oriented. Skin: No rash. Port-A-Cath without erythema.   Lab Results:  Lab Results  Component Value Date   WBC 7.0 02/19/2019   HGB 13.7 02/19/2019   HCT 41.0 02/19/2019   MCV 91.3 02/19/2019   PLT 217 02/19/2019   NEUTROABS 4.9 02/19/2019    Imaging:  No results found.  Medications: I have reviewed the patient's current medications.  Assessment/Plan: 1. Stage IV Colon cancer, cecum, presenting with a palpable low abdominal/pelvic mass ? Colonoscopy 03/31/2017 confirmed a cecum mass, biopsy positive for poorly differentiated adenocarcinoma ? CT abdomen/pelvis 03/29/2017-large necrotic right iliac fossa mass, enlarged right common iliac nodes, loculated pelvic ascites, right lower quadrant mesenteric masses ? Right colectomy/right oophorectomy-salpingectomy 04/04/2017 ? Pathology from the right colon resection, grade 3 adenocarcinoma of the cecum, pT4b,pN2b ? Loss of MSH6 expression ? PET scan 04/29/2017-residual tumor at the left adnexa, and internal iliac lymph node, and aortocaval node ? Cycle 1 Pembrolizumab09/07/2017 ? Cycle 2 Pembrolizumab 05/30/2017 ? Cycle 3 Pembrolizumab 06/20/2017 ? Cycle4  Pembrolizumab 07/11/2017 ? Cycle 5 Pembrolizumab 08/04/2017 ? Cycle 6 pembrolizumab 08/22/2017 ? Cycle 7 pembrolizumab 09/12/2017 ? CTs 09/29/2017-previously noted retroperitoneal and right lower quadrant adenopathy has resolved, necrotic pelvic mass not visualized, single enhancing nodule at the umbilicus is nonspecific ? Cycle 8 pembrolizumab 10/03/2017 ? Cycle 9 Pembrolizumab 10/24/2017 ? Cycle 10 Pembrolizumab 11/14/2017 ? Cycle 11 pembrolizumab 12/05/2017 ? Cycle 12 pembrolizumab 12/26/2017 ? Cycle 13 Pembrolizumab 01/16/2018 ? Cycle 14 pembrolizumab 02/06/2018 ? Cycle 15 Pembrolizumab 02/27/2018 ? Cycle 16 Pembrolizumab 03/20/2018 ? CT 04/10/2018-no evidence of recurrent malignancy, stable subcutaneous lesion at the anterior abdominal wall ? Cycle 17 pembrolizumab 04/10/2018 ? Cycle 18 Pembrolizumab 05/01/2018 ? Cycle 19 Pembrolizumab 05/22/2018 ? Cycle 20 Pembrolizumab 06/12/2018 ? Cycle 21 pembrolizumab 07/03/2018 ? Cycle 22 Pembrolizumab 07/24/2018 ? Cycle 23 pembrolizumab 08/14/2018 ? Cycle 24 pembrolizumab 09/04/2018 ? Cycle 25 pembrolizumab 09/25/2018 ? Cycle 26 Pembrolizumab 10/16/2018 ? Cycle 27 pembrolizumab 11/06/2018 ? Cycle 28 pembrolizumab 12/18/2018 ? Cycle 29 Pembrolizumab 01/08/2019 ? Cycle 30 Pembrolizumab 01/29/2019 ? Cycle 31 pembrolizumab 02/19/2019  2. 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 Anemia-likely iron deficiency anemia secondary to #1.Resolved.  4.G3 P3  5. Appendectomy  5. Maternal aunt with colon cancer  6.Hypothyroid-likely secondary to toxicity from pembrolizumab, thyroid hormone replacement started 08/22/2017  7.Port-A-Cath placement 11/28/2017   Disposition: Meagan [name] appears stable.  She has completed 30 cycles of pembrolizumab.  She continues to tolerate treatment well.  There is no clinical evidence of disease progression.  Plan to proceed with cycle 31 today as scheduled.  We reviewed the CBC  from today.  Counts are adequate for treatment.  We will follow-up on the TSH.  She will return for lab, follow-up and pembrolizumab in 3 weeks.  She will contact the office in the interim with any problems.    Ned Card ANP/GNP-BC   02/19/2019  9:54 AM

## 2019-02-19 NOTE — Patient Instructions (Signed)
[facility] Discharge Instructions for Patients Receiving Chemotherapy  Today you received the following chemotherapy agents Pembrolizumab (KEYTRUDA).  To help prevent nausea and vomiting after your treatment, we encourage you to take your nausea medication as prescribed.   If you develop nausea and vomiting that is not controlled by your nausea medication, call the clinic.   BELOW ARE SYMPTOMS THAT SHOULD BE REPORTED IMMEDIATELY:  *FEVER GREATER THAN 100.5 F  *CHILLS WITH OR WITHOUT FEVER  NAUSEA AND VOMITING THAT IS NOT CONTROLLED WITH YOUR NAUSEA MEDICATION  *UNUSUAL SHORTNESS OF BREATH  *UNUSUAL BRUISING OR BLEEDING  TENDERNESS IN MOUTH AND THROAT WITH OR WITHOUT PRESENCE OF ULCERS  *URINARY PROBLEMS  *BOWEL PROBLEMS  UNUSUAL RASH Items with * indicate a potential emergency and should be followed up as soon as possible.  Feel free to call the clinic should you have any questions or concerns. The clinic phone number is [phone].  Please show the CHEMO ALERT CARD at check-in to the Emergency Department and triage nurse.  Coronavirus (COVID-19) Are you at risk?  Are you at risk for the Coronavirus (COVID-19)?  To be considered HIGH RISK for Coronavirus (COVID-19), you have to meet the following criteria:  . Traveled to [place], [place], [place], [place] or [place]; or in the [place] to [place], [place], [place], or [place]; and have fever, cough, and shortness of breath within the last 2 weeks of travel OR . Been in close contact with a person diagnosed with COVID-19 within the last 2 weeks and have fever, cough, and shortness of breath . IF YOU DO NOT MEET THESE CRITERIA, YOU ARE CONSIDERED LOW RISK FOR COVID-19.  What to do if you are HIGH RISK for COVID-19?  . If you are having a medical emergency, call 911. . Seek medical care right away. Before you go to a doctor's office, urgent care or emergency department, call ahead and tell  them about your recent travel, contact with someone diagnosed with COVID-19, and your symptoms. You should receive instructions from your physician's office regarding next steps of care.  . When you arrive at healthcare provider, tell the healthcare staff immediately you have returned from visiting [place], [place], [place], [place] or [place]; or traveled in the [place] to [place], [place], [place], or [place]; in the last two weeks or you have been in close contact with a person diagnosed with COVID-19 in the last 2 weeks.   . Tell the health care staff about your symptoms: fever, cough and shortness of breath. . After you have been seen by a medical provider, you will be either: o Tested for (COVID-19) and discharged home on quarantine except to seek medical care if symptoms worsen, and asked to  - Stay home and avoid contact with others until you get your results (4-5 days)  - Avoid travel on public transportation if possible (such as bus, train, or airplane) or o Sent to the Emergency Department by EMS for evaluation, COVID-19 testing, and possible admission depending on your condition and test results.  What to do if you are LOW RISK for COVID-19?  Reduce your risk of any infection by using the same precautions used for avoiding the common cold or flu:  . Wash your hands often with soap and warm water for at least 20 seconds.  If soap and water are not readily available, use an alcohol-based hand sanitizer with at least 60% alcohol.  . If coughing or   sneezing, cover your mouth and nose by coughing or sneezing into the elbow areas of your shirt or coat, into a tissue or into your sleeve (not your hands). . Avoid shaking hands with others and consider head nods or verbal greetings only. . Avoid touching your eyes, nose, or mouth with unwashed hands.  . Avoid close contact with people who are sick. . Avoid places or events with large numbers of people in one location, like concerts or  sporting events. . Carefully consider travel plans you have or are making. . If you are planning any travel outside or inside the [place], visit the CDC's Travelers' Health webpage for the latest health notices. . If you have some symptoms but not all symptoms, continue to monitor at home and seek medical attention if your symptoms worsen. . If you are having a medical emergency, call 911.   ADDITIONAL HEALTHCARE OPTIONS FOR PATIENTS  [facility] / e-Visit: [email or web address]         [facility]: [phone]  [facility]: [phone]                   [facility]: [phone]    

## 2019-03-07 ENCOUNTER — Other Ambulatory Visit: Payer: Self-pay | Admitting: Oncology

## 2019-03-12 ENCOUNTER — Inpatient Hospital Stay: Payer: Self-pay

## 2019-03-12 ENCOUNTER — Other Ambulatory Visit: Payer: Self-pay

## 2019-03-12 ENCOUNTER — Inpatient Hospital Stay (HOSPITAL_BASED_OUTPATIENT_CLINIC_OR_DEPARTMENT_OTHER): Payer: Self-pay | Admitting: Nurse Practitioner

## 2019-03-12 ENCOUNTER — Inpatient Hospital Stay: Payer: Self-pay | Attending: Oncology

## 2019-03-12 ENCOUNTER — Encounter: Payer: Self-pay | Admitting: Nurse Practitioner

## 2019-03-12 VITALS — BP 112/78 | HR 81 | Temp 98.5°F | Resp 18 | Ht 62.0 in | Wt 152.4 lb

## 2019-03-12 DIAGNOSIS — Z5112 Encounter for antineoplastic immunotherapy: Secondary | ICD-10-CM | POA: Insufficient documentation

## 2019-03-12 DIAGNOSIS — C189 Malignant neoplasm of colon, unspecified: Secondary | ICD-10-CM

## 2019-03-12 DIAGNOSIS — C180 Malignant neoplasm of cecum: Secondary | ICD-10-CM | POA: Insufficient documentation

## 2019-03-12 DIAGNOSIS — E039 Hypothyroidism, unspecified: Secondary | ICD-10-CM | POA: Insufficient documentation

## 2019-03-12 DIAGNOSIS — Z95828 Presence of other vascular implants and grafts: Secondary | ICD-10-CM

## 2019-03-12 LAB — CBC WITH DIFFERENTIAL (CANCER CENTER ONLY)
Abs Immature Granulocytes: 0.02 10*3/uL (ref 0.00–0.07)
Basophils Absolute: 0 10*3/uL (ref 0.0–0.1)
Basophils Relative: 0 %
Eosinophils Absolute: 0.1 10*3/uL (ref 0.0–0.5)
Eosinophils Relative: 2 %
HCT: 40.9 % (ref 36.0–46.0)
Hemoglobin: 13.7 g/dL (ref 12.0–15.0)
Immature Granulocytes: 0 %
Lymphocytes Relative: 17 %
Lymphs Abs: 1.4 10*3/uL (ref 0.7–4.0)
MCH: 30.6 pg (ref 26.0–34.0)
MCHC: 33.5 g/dL (ref 30.0–36.0)
MCV: 91.3 fL (ref 80.0–100.0)
Monocytes Absolute: 0.6 10*3/uL (ref 0.1–1.0)
Monocytes Relative: 7 %
Neutro Abs: 6.2 10*3/uL (ref 1.7–7.7)
Neutrophils Relative %: 74 %
Platelet Count: 227 10*3/uL (ref 150–400)
RBC: 4.48 MIL/uL (ref 3.87–5.11)
RDW: 12.5 % (ref 11.5–15.5)
WBC Count: 8.3 10*3/uL (ref 4.0–10.5)
nRBC: 0 % (ref 0.0–0.2)

## 2019-03-12 LAB — CMP (CANCER CENTER ONLY)
ALT: 7 U/L (ref 0–44)
AST: 12 U/L — ABNORMAL LOW (ref 15–41)
Albumin: 3.7 g/dL (ref 3.5–5.0)
Alkaline Phosphatase: 64 U/L (ref 38–126)
Anion gap: 6 (ref 5–15)
BUN: 6 mg/dL (ref 6–20)
CO2: 26 mmol/L (ref 22–32)
Calcium: 8.7 mg/dL — ABNORMAL LOW (ref 8.9–10.3)
Chloride: 105 mmol/L (ref 98–111)
Creatinine: 0.74 mg/dL (ref 0.44–1.00)
GFR, Est AFR Am: >60 mL/min (ref >60)
GFR, Estimated: >60 mL/min (ref >60)
Glucose, Bld: 89 mg/dL (ref 70–99)
Potassium: 4 mmol/L (ref 3.5–5.1)
Sodium: 137 mmol/L (ref 135–145)
Total Bilirubin: 0.4 mg/dL (ref 0.3–1.2)
Total Protein: 7.3 g/dL (ref 6.5–8.1)

## 2019-03-12 MED ORDER — SODIUM CHLORIDE 0.9 % IV SOLN
Freq: Once | INTRAVENOUS | Status: AC
  Administered 2019-03-12: 11:00:00 via INTRAVENOUS
  Filled 2019-03-12: qty 250

## 2019-03-12 MED ORDER — SODIUM CHLORIDE 0.9% FLUSH
10.0000 mL | INTRAVENOUS | Status: DC | PRN
  Administered 2019-03-12: 10 mL
  Filled 2019-03-12: qty 10

## 2019-03-12 MED ORDER — SODIUM CHLORIDE 0.9% FLUSH
10.0000 mL | Freq: Once | INTRAVENOUS | Status: AC
  Administered 2019-03-12: 10 mL
  Filled 2019-03-12: qty 10

## 2019-03-12 MED ORDER — SODIUM CHLORIDE 0.9 % IV SOLN
200.0000 mg | Freq: Once | INTRAVENOUS | Status: AC
  Administered 2019-03-12: 200 mg via INTRAVENOUS
  Filled 2019-03-12: qty 8

## 2019-03-12 MED ORDER — HEPARIN SOD (PORK) LOCK FLUSH 100 UNIT/ML IV SOLN
500.0000 [IU] | Freq: Once | INTRAVENOUS | Status: AC | PRN
  Administered 2019-03-12: 500 [IU]
  Filled 2019-03-12: qty 5

## 2019-03-12 NOTE — Patient Instructions (Signed)

## 2019-03-12 NOTE — Patient Instructions (Signed)
[facility] Discharge Instructions for Patients Receiving Chemotherapy  Today you received the following chemotherapy agents Pembrolizumab (KEYTRUDA).  To help prevent nausea and vomiting after your treatment, we encourage you to take your nausea medication as prescribed.   If you develop nausea and vomiting that is not controlled by your nausea medication, call the clinic.   BELOW ARE SYMPTOMS THAT SHOULD BE REPORTED IMMEDIATELY:  *FEVER GREATER THAN 100.5 F  *CHILLS WITH OR WITHOUT FEVER  NAUSEA AND VOMITING THAT IS NOT CONTROLLED WITH YOUR NAUSEA MEDICATION  *UNUSUAL SHORTNESS OF BREATH  *UNUSUAL BRUISING OR BLEEDING  TENDERNESS IN MOUTH AND THROAT WITH OR WITHOUT PRESENCE OF ULCERS  *URINARY PROBLEMS  *BOWEL PROBLEMS  UNUSUAL RASH Items with * indicate a potential emergency and should be followed up as soon as possible.  Feel free to call the clinic should you have any questions or concerns. The clinic phone number is [phone].  Please show the CHEMO ALERT CARD at check-in to the Emergency Department and triage nurse.  Coronavirus (COVID-19) Are you at risk?  Are you at risk for the Coronavirus (COVID-19)?  To be considered HIGH RISK for Coronavirus (COVID-19), you have to meet the following criteria:  . Traveled to [place], [place], [place], [place] or [place]; or in the [place] to [place], [place], [place], or [place]; and have fever, cough, and shortness of breath within the last 2 weeks of travel OR . Been in close contact with a person diagnosed with COVID-19 within the last 2 weeks and have fever, cough, and shortness of breath . IF YOU DO NOT MEET THESE CRITERIA, YOU ARE CONSIDERED LOW RISK FOR COVID-19.  What to do if you are HIGH RISK for COVID-19?  . If you are having a medical emergency, call 911. . Seek medical care right away. Before you go to a doctor's office, urgent care or emergency department, call ahead and tell  them about your recent travel, contact with someone diagnosed with COVID-19, and your symptoms. You should receive instructions from your physician's office regarding next steps of care.  . When you arrive at healthcare provider, tell the healthcare staff immediately you have returned from visiting [place], [place], [place], [place] or [place]; or traveled in the [place] to [place], [place], [place], or [place]; in the last two weeks or you have been in close contact with a person diagnosed with COVID-19 in the last 2 weeks.   . Tell the health care staff about your symptoms: fever, cough and shortness of breath. . After you have been seen by a medical provider, you will be either: o Tested for (COVID-19) and discharged home on quarantine except to seek medical care if symptoms worsen, and asked to  - Stay home and avoid contact with others until you get your results (4-5 days)  - Avoid travel on public transportation if possible (such as bus, train, or airplane) or o Sent to the Emergency Department by EMS for evaluation, COVID-19 testing, and possible admission depending on your condition and test results.  What to do if you are LOW RISK for COVID-19?  Reduce your risk of any infection by using the same precautions used for avoiding the common cold or flu:  . Wash your hands often with soap and warm water for at least 20 seconds.  If soap and water are not readily available, use an alcohol-based hand sanitizer with at least 60% alcohol.  . If coughing or   sneezing, cover your mouth and nose by coughing or sneezing into the elbow areas of your shirt or coat, into a tissue or into your sleeve (not your hands). . Avoid shaking hands with others and consider head nods or verbal greetings only. . Avoid touching your eyes, nose, or mouth with unwashed hands.  . Avoid close contact with people who are sick. . Avoid places or events with large numbers of people in one location, like concerts or  sporting events. . Carefully consider travel plans you have or are making. . If you are planning any travel outside or inside the [place], visit the CDC's Travelers' Health webpage for the latest health notices. . If you have some symptoms but not all symptoms, continue to monitor at home and seek medical attention if your symptoms worsen. . If you are having a medical emergency, call 911.   ADDITIONAL HEALTHCARE OPTIONS FOR PATIENTS  [facility] / e-Visit: [email or web address]         [facility]: [phone]  [facility]: [phone]                   [facility]: [phone]    

## 2019-03-12 NOTE — Progress Notes (Signed)
[facility] OFFICE PROGRESS NOTE   Diagnosis: Colon cancer  INTERVAL HISTORY:   Meagan [name] returns as scheduled.  She completed another cycle of pembrolizumab 02/19/2019.  She feels well.  No nausea or vomiting.  No mouth sores.  No diarrhea.  No rash.  She denies pain.  She has a good appetite.  Objective:  Vital signs in last 24 hours:  Blood pressure 112/78, pulse 81, temperature 98.5 F (36.9 C), temperature source Oral, resp. rate 18, height _0  (1.575 m), weight 152 lb 6.4 oz (69.1 kg), SpO2 97 %.   Limited exam due to COVID-19 GI: Abdomen soft and nontender.  No hepatomegaly.  No mass. Vascular: No leg edema. Neuro: Alert and oriented. Skin: No rash. Port-A-Cath without erythema.   Lab Results:  Lab Results  Component Value Date   WBC 8.3 03/12/2019   HGB 13.7 03/12/2019   HCT 40.9 03/12/2019   MCV 91.3 03/12/2019   PLT 227 03/12/2019   NEUTROABS 6.2 03/12/2019    Imaging:  No results found.  Medications: I have reviewed the patient's current medications.  Assessment/Plan: 1. Stage IV Colon cancer, cecum, presenting with a palpable low abdominal/pelvic mass ? Colonoscopy 03/31/2017 confirmed a cecum mass, biopsy positive for poorly differentiated adenocarcinoma ? CT abdomen/pelvis 03/29/2017-large necrotic right iliac fossa mass, enlarged right common iliac nodes, loculated pelvic ascites, right lower quadrant mesenteric masses ? Right colectomy/right oophorectomy-salpingectomy 04/04/2017 ? Pathology from the right colon resection, grade 3 adenocarcinoma of the cecum, pT4b,pN2b ? Loss of MSH6 expression ? PET scan 04/29/2017-residual tumor at the left adnexa, and internal iliac lymph node, and aortocaval node ? Cycle 1 Pembrolizumab09/07/2017 ? Cycle 2 Pembrolizumab 05/30/2017 ? Cycle 3 Pembrolizumab 06/20/2017 ? Cycle4 Pembrolizumab 07/11/2017 ? Cycle 5 Pembrolizumab 08/04/2017 ? Cycle 6 pembrolizumab 08/22/2017 ? Cycle 7  pembrolizumab 09/12/2017 ? CTs 09/29/2017-previously noted retroperitoneal and right lower quadrant adenopathy has resolved, necrotic pelvic mass not visualized, single enhancing nodule at the umbilicus is nonspecific ? Cycle 8 pembrolizumab 10/03/2017 ? Cycle 9 Pembrolizumab 10/24/2017 ? Cycle 10 Pembrolizumab 11/14/2017 ? Cycle 11 pembrolizumab 12/05/2017 ? Cycle 12 pembrolizumab 12/26/2017 ? Cycle 13 Pembrolizumab 01/16/2018 ? Cycle 14 pembrolizumab 02/06/2018 ? Cycle 15 Pembrolizumab 02/27/2018 ? Cycle 16 Pembrolizumab 03/20/2018 ? CT 04/10/2018-no evidence of recurrent malignancy, stable subcutaneous lesion at the anterior abdominal wall ? Cycle 17 pembrolizumab 04/10/2018 ? Cycle 18 Pembrolizumab 05/01/2018 ? Cycle 19 Pembrolizumab 05/22/2018 ? Cycle 20 Pembrolizumab 06/12/2018 ? Cycle 21 pembrolizumab 07/03/2018 ? Cycle 22 Pembrolizumab 07/24/2018 ? Cycle 23 pembrolizumab 08/14/2018 ? Cycle 24 pembrolizumab 09/04/2018 ? Cycle 25 pembrolizumab 09/25/2018 ? Cycle 26 Pembrolizumab 10/16/2018 ? Cycle 27 pembrolizumab 11/06/2018 ? Cycle 28 pembrolizumab 12/18/2018 ? Cycle 29 Pembrolizumab 01/08/2019 ? Cycle 30Pembrolizumab 01/29/2019 ? Cycle 31 pembrolizumab 02/19/2019 ? Cycle 32 pembrolizumab 03/12/2019  2. 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 Anemia-likely iron deficiency anemia secondary to #1.Resolved.  4.G3 P3  5. Appendectomy  5. Maternal aunt with colon cancer  6.Hypothyroid-likely secondary to toxicity from pembrolizumab, thyroid hormone replacement started 08/22/2017  7.Port-A-Cath placement 11/28/2017   Disposition: Meagan [name] appears well.  She has completed 31 cycles of pembrolizumab.  She is tolerating treatment well.  There is no clinical evidence of disease progression.  Plan to proceed with cycle 32 today as scheduled.  We reviewed the CBC and chemistry panel from today, adequate for treatment.  She will return for lab,  follow-up, pembrolizumab in 3 weeks.  She will contact the  office in the interim with any problems.    Ned Card ANP/GNP-BC   03/12/2019  10:46 AM

## 2019-03-28 ENCOUNTER — Other Ambulatory Visit: Payer: Self-pay | Admitting: Oncology

## 2019-04-02 ENCOUNTER — Other Ambulatory Visit: Payer: Self-pay

## 2019-04-02 ENCOUNTER — Inpatient Hospital Stay (HOSPITAL_BASED_OUTPATIENT_CLINIC_OR_DEPARTMENT_OTHER): Payer: Self-pay | Admitting: Oncology

## 2019-04-02 ENCOUNTER — Inpatient Hospital Stay: Payer: Self-pay

## 2019-04-02 VITALS — BP 106/80 | HR 68 | Temp 98.3°F | Resp 18 | Ht 62.0 in | Wt 151.9 lb

## 2019-04-02 DIAGNOSIS — C189 Malignant neoplasm of colon, unspecified: Secondary | ICD-10-CM

## 2019-04-02 DIAGNOSIS — Z95828 Presence of other vascular implants and grafts: Secondary | ICD-10-CM

## 2019-04-02 DIAGNOSIS — E039 Hypothyroidism, unspecified: Secondary | ICD-10-CM

## 2019-04-02 LAB — CMP (CANCER CENTER ONLY)
ALT: 13 U/L (ref 0–44)
AST: 16 U/L (ref 15–41)
Albumin: 3.9 g/dL (ref 3.5–5.0)
Alkaline Phosphatase: 62 U/L (ref 38–126)
Anion gap: 8 (ref 5–15)
BUN: 7 mg/dL (ref 6–20)
CO2: 23 mmol/L (ref 22–32)
Calcium: 9.2 mg/dL (ref 8.9–10.3)
Chloride: 107 mmol/L (ref 98–111)
Creatinine: 0.75 mg/dL (ref 0.44–1.00)
GFR, Est AFR Am: >60 mL/min (ref >60)
GFR, Estimated: >60 mL/min (ref >60)
Glucose, Bld: 89 mg/dL (ref 70–99)
Potassium: 4 mmol/L (ref 3.5–5.1)
Sodium: 138 mmol/L (ref 135–145)
Total Bilirubin: 0.4 mg/dL (ref 0.3–1.2)
Total Protein: 7.4 g/dL (ref 6.5–8.1)

## 2019-04-02 LAB — CBC WITH DIFFERENTIAL (CANCER CENTER ONLY)
Abs Immature Granulocytes: 0.02 10*3/uL (ref 0.00–0.07)
Basophils Absolute: 0 10*3/uL (ref 0.0–0.1)
Basophils Relative: 0 %
Eosinophils Absolute: 0.2 10*3/uL (ref 0.0–0.5)
Eosinophils Relative: 3 %
HCT: 40.6 % (ref 36.0–46.0)
Hemoglobin: 13.5 g/dL (ref 12.0–15.0)
Immature Granulocytes: 0 %
Lymphocytes Relative: 20 %
Lymphs Abs: 1.4 10*3/uL (ref 0.7–4.0)
MCH: 30.1 pg (ref 26.0–34.0)
MCHC: 33.3 g/dL (ref 30.0–36.0)
MCV: 90.4 fL (ref 80.0–100.0)
Monocytes Absolute: 0.5 10*3/uL (ref 0.1–1.0)
Monocytes Relative: 8 %
Neutro Abs: 4.7 10*3/uL (ref 1.7–7.7)
Neutrophils Relative %: 69 %
Platelet Count: 207 10*3/uL (ref 150–400)
RBC: 4.49 MIL/uL (ref 3.87–5.11)
RDW: 12.4 % (ref 11.5–15.5)
WBC Count: 6.8 10*3/uL (ref 4.0–10.5)
nRBC: 0 % (ref 0.0–0.2)

## 2019-04-02 LAB — TSH: TSH: 4.401 u[IU]/mL — ABNORMAL HIGH (ref 0.308–3.960)

## 2019-04-02 LAB — CEA (IN HOUSE-CHCC): CEA (CHCC-In House): 3.93 ng/mL (ref 0.00–5.00)

## 2019-04-02 MED ORDER — SODIUM CHLORIDE 0.9% FLUSH
10.0000 mL | INTRAVENOUS | Status: DC | PRN
  Administered 2019-04-02: 10 mL
  Filled 2019-04-02: qty 10

## 2019-04-02 MED ORDER — SODIUM CHLORIDE 0.9 % IV SOLN
Freq: Once | INTRAVENOUS | Status: AC
  Administered 2019-04-02: 11:00:00 via INTRAVENOUS
  Filled 2019-04-02: qty 250

## 2019-04-02 MED ORDER — SODIUM CHLORIDE 0.9 % IV SOLN
200.0000 mg | Freq: Once | INTRAVENOUS | Status: AC
  Administered 2019-04-02: 200 mg via INTRAVENOUS
  Filled 2019-04-02: qty 8

## 2019-04-02 MED ORDER — HEPARIN SOD (PORK) LOCK FLUSH 100 UNIT/ML IV SOLN
500.0000 [IU] | Freq: Once | INTRAVENOUS | Status: AC | PRN
  Administered 2019-04-02: 500 [IU]
  Filled 2019-04-02: qty 5

## 2019-04-02 MED ORDER — SODIUM CHLORIDE 0.9% FLUSH
10.0000 mL | Freq: Once | INTRAVENOUS | Status: AC
  Administered 2019-04-02: 10 mL
  Filled 2019-04-02: qty 10

## 2019-04-02 NOTE — Progress Notes (Signed)
[place] OFFICE PROGRESS NOTE   Diagnosis: Colon cancer  INTERVAL HISTORY:   [name] returns as scheduled.  She completed another treatment with pembrolizumab on 03/12/2019.  No rash or diarrhea.  She feels well.  No complaint.  Objective:  Vital signs in last 24 hours:  Blood pressure 106/80, pulse 68, temperature 98.3 F (36.8 C), temperature source Oral, resp. rate 18, height 5' 2"  (1.575 m), weight 151 lb 14.4 oz (68.9 kg), SpO2 100 %.   Physical examination not performed today secondary to distancing with the COVID pandemic  Lab Results:  Lab Results  Component Value Date   WBC 6.8 04/02/2019   HGB 13.5 04/02/2019   HCT 40.6 04/02/2019   MCV 90.4 04/02/2019   PLT 207 04/02/2019   NEUTROABS 4.7 04/02/2019    CMP  Lab Results  Component Value Date   NA 138 04/02/2019   K 4.0 04/02/2019   CL 107 04/02/2019   CO2 23 04/02/2019   GLUCOSE 89 04/02/2019   BUN 7 04/02/2019   CREATININE 0.75 04/02/2019   CALCIUM 9.2 04/02/2019   PROT 7.4 04/02/2019   ALBUMIN 3.9 04/02/2019   AST 16 04/02/2019   ALT 13 04/02/2019   ALKPHOS 62 04/02/2019   BILITOT 0.4 04/02/2019   GFRNONAA >60 04/02/2019   GFRAA >60 04/02/2019    Lab Results  Component Value Date   CEA1 3.85 02/19/2019     Medications: I have reviewed the patient's current medications.   Assessment/Plan: 1. Stage IV Colon cancer, cecum, presenting with a palpable low abdominal/pelvic mass ? Colonoscopy 03/31/2017 confirmed a cecum mass, biopsy positive for poorly differentiated adenocarcinoma ? CT abdomen/pelvis 03/29/2017-large necrotic right iliac fossa mass, enlarged right common iliac nodes, loculated pelvic ascites, right lower quadrant mesenteric masses ? Right colectomy/right oophorectomy-salpingectomy 04/04/2017 ? Pathology from the right colon resection, grade 3 adenocarcinoma of the cecum, pT4b,pN2b ? Loss of MSH6 expression ? PET scan 04/29/2017-residual tumor at the left  adnexa, and internal iliac lymph node, and aortocaval node ? Cycle 1 Pembrolizumab09/07/2017 ? Cycle 2 Pembrolizumab 05/30/2017 ? Cycle 3 Pembrolizumab 06/20/2017 ? Cycle4 Pembrolizumab 07/11/2017 ? Cycle 5 Pembrolizumab 08/04/2017 ? Cycle 6 pembrolizumab 08/22/2017 ? Cycle 7 pembrolizumab 09/12/2017 ? CTs 09/29/2017-previously noted retroperitoneal and right lower quadrant adenopathy has resolved, necrotic pelvic mass not visualized, single enhancing nodule at the umbilicus is nonspecific ? Cycle 8 pembrolizumab 10/03/2017 ? Cycle 9 Pembrolizumab 10/24/2017 ? Cycle 10 Pembrolizumab 11/14/2017 ? Cycle 11 pembrolizumab 12/05/2017 ? Cycle 12 pembrolizumab 12/26/2017 ? Cycle 13 Pembrolizumab 01/16/2018 ? Cycle 14 pembrolizumab 02/06/2018 ? Cycle 15 Pembrolizumab 02/27/2018 ? Cycle 16 Pembrolizumab 03/20/2018 ? CT 04/10/2018-no evidence of recurrent malignancy, stable subcutaneous lesion at the anterior abdominal wall ? Cycle 17 pembrolizumab 04/10/2018 ? Cycle 18 Pembrolizumab 05/01/2018 ? Cycle 19 Pembrolizumab 05/22/2018 ? Cycle 20 Pembrolizumab 06/12/2018 ? Cycle 21 pembrolizumab 07/03/2018 ? Cycle 22 Pembrolizumab 07/24/2018 ? Cycle 23 pembrolizumab 08/14/2018 ? Cycle 24 pembrolizumab 09/04/2018 ? Cycle 25 pembrolizumab 09/25/2018 ? Cycle 26 Pembrolizumab 10/16/2018 ? Cycle 27 pembrolizumab 11/06/2018 ? Cycle 28 pembrolizumab 12/18/2018 ? Cycle 29 Pembrolizumab 01/08/2019 ? Cycle 30Pembrolizumab 01/29/2019 ? Cycle 31 pembrolizumab 02/19/2019 ? Cycle 32 pembrolizumab 03/12/2019 ? Cycle 33 pembrolizumab 04/02/2019  2. 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 Anemia-likely iron deficiency anemia secondary to #1.Resolved.  4.G3 P3  5. Appendectomy  5. Maternal aunt with colon cancer  6.Hypothyroid-likely secondary to toxicity from pembrolizumab, thyroid hormone replacement started 08/22/2017  7.Port-A-Cath placement  11/28/2017      Disposition: She appears stable.  She will complete another treatment with pembrolizumab today.  We will follow-up on the TSH today.  She will return for office visit in 3 weeks.  We will refer her for restaging CTs after the next cycle of pembrolizumab.  Betsy Coder, MD  04/02/2019  11:01 AM

## 2019-04-02 NOTE — Patient Instructions (Signed)
[facility] Discharge Instructions for Patients Receiving Chemotherapy  Today you received the following chemotherapy agents Pembrolizumab (KEYTRUDA).  To help prevent nausea and vomiting after your treatment, we encourage you to take your nausea medication as prescribed.   If you develop nausea and vomiting that is not controlled by your nausea medication, call the clinic.   BELOW ARE SYMPTOMS THAT SHOULD BE REPORTED IMMEDIATELY:  *FEVER GREATER THAN 100.5 F  *CHILLS WITH OR WITHOUT FEVER  NAUSEA AND VOMITING THAT IS NOT CONTROLLED WITH YOUR NAUSEA MEDICATION  *UNUSUAL SHORTNESS OF BREATH  *UNUSUAL BRUISING OR BLEEDING  TENDERNESS IN MOUTH AND THROAT WITH OR WITHOUT PRESENCE OF ULCERS  *URINARY PROBLEMS  *BOWEL PROBLEMS  UNUSUAL RASH Items with * indicate a potential emergency and should be followed up as soon as possible.  Feel free to call the clinic should you have any questions or concerns. The clinic phone number is [phone].  Please show the CHEMO ALERT CARD at check-in to the Emergency Department and triage nurse.   

## 2019-04-18 ENCOUNTER — Other Ambulatory Visit: Payer: Self-pay | Admitting: Oncology

## 2019-04-23 ENCOUNTER — Other Ambulatory Visit: Payer: Self-pay

## 2019-04-23 ENCOUNTER — Inpatient Hospital Stay: Payer: Self-pay

## 2019-04-23 ENCOUNTER — Inpatient Hospital Stay: Payer: Self-pay | Attending: Oncology | Admitting: Oncology

## 2019-04-23 VITALS — BP 113/73 | HR 73 | Temp 98.5°F | Resp 18 | Ht 62.0 in | Wt 155.3 lb

## 2019-04-23 DIAGNOSIS — C189 Malignant neoplasm of colon, unspecified: Secondary | ICD-10-CM

## 2019-04-23 DIAGNOSIS — D509 Iron deficiency anemia, unspecified: Secondary | ICD-10-CM | POA: Insufficient documentation

## 2019-04-23 DIAGNOSIS — Z5112 Encounter for antineoplastic immunotherapy: Secondary | ICD-10-CM | POA: Insufficient documentation

## 2019-04-23 DIAGNOSIS — Z1509 Genetic susceptibility to other malignant neoplasm: Secondary | ICD-10-CM | POA: Insufficient documentation

## 2019-04-23 DIAGNOSIS — Z800 Family history of malignant neoplasm of digestive organs: Secondary | ICD-10-CM | POA: Insufficient documentation

## 2019-04-23 DIAGNOSIS — E039 Hypothyroidism, unspecified: Secondary | ICD-10-CM | POA: Insufficient documentation

## 2019-04-23 DIAGNOSIS — C180 Malignant neoplasm of cecum: Secondary | ICD-10-CM | POA: Insufficient documentation

## 2019-04-23 MED ORDER — HEPARIN SOD (PORK) LOCK FLUSH 100 UNIT/ML IV SOLN
500.0000 [IU] | Freq: Once | INTRAVENOUS | Status: AC | PRN
  Administered 2019-04-23: 500 [IU]
  Filled 2019-04-23: qty 5

## 2019-04-23 MED ORDER — SODIUM CHLORIDE 0.9% FLUSH
10.0000 mL | INTRAVENOUS | Status: DC | PRN
  Administered 2019-04-23: 10 mL
  Filled 2019-04-23: qty 10

## 2019-04-23 MED ORDER — SODIUM CHLORIDE 0.9 % IV SOLN
Freq: Once | INTRAVENOUS | Status: AC
  Administered 2019-04-23: 12:00:00 via INTRAVENOUS
  Filled 2019-04-23: qty 250

## 2019-04-23 MED ORDER — SODIUM CHLORIDE 0.9 % IV SOLN
200.0000 mg | Freq: Once | INTRAVENOUS | Status: AC
  Administered 2019-04-23: 200 mg via INTRAVENOUS
  Filled 2019-04-23: qty 8

## 2019-04-23 NOTE — Progress Notes (Signed)
[place] OFFICE PROGRESS NOTE   Diagnosis: Colon cancer  INTERVAL HISTORY:   Meagan [name] completed another cycle of pembrolizumab on 04/02/2019.  She feels well.  Good appetite.  No rash or diarrhea.  No complaint.  She is taking thyroid hormone.  Objective:  Vital signs in last 24 hours:  Blood pressure 113/73, pulse 73, temperature 98.5 F (36.9 C), resp. rate 18, height 5' 2"  (1.575 m), weight 155 lb 4.8 oz (70.4 kg), SpO2 100 %.    Physical examination not performed today  Lab Results:  Lab Results  Component Value Date   WBC 6.8 04/02/2019   HGB 13.5 04/02/2019   HCT 40.6 04/02/2019   MCV 90.4 04/02/2019   PLT 207 04/02/2019   NEUTROABS 4.7 04/02/2019    CMP  Lab Results  Component Value Date   NA 138 04/02/2019   K 4.0 04/02/2019   CL 107 04/02/2019   CO2 23 04/02/2019   GLUCOSE 89 04/02/2019   BUN 7 04/02/2019   CREATININE 0.75 04/02/2019   CALCIUM 9.2 04/02/2019   PROT 7.4 04/02/2019   ALBUMIN 3.9 04/02/2019   AST 16 04/02/2019   ALT 13 04/02/2019   ALKPHOS 62 04/02/2019   BILITOT 0.4 04/02/2019   GFRNONAA >60 04/02/2019   GFRAA >60 04/02/2019    Lab Results  Component Value Date   CEA1 3.93 04/02/2019     Medications: I have reviewed the patient's current medications.   Assessment/Plan:  1. Stage IV Colon cancer, cecum, presenting with a palpable low abdominal/pelvic mass ? Colonoscopy 03/31/2017 confirmed a cecum mass, biopsy positive for poorly differentiated adenocarcinoma ? CT abdomen/pelvis 03/29/2017-large necrotic right iliac fossa mass, enlarged right common iliac nodes, loculated pelvic ascites, right lower quadrant mesenteric masses ? Right colectomy/right oophorectomy-salpingectomy 04/04/2017 ? Pathology from the right colon resection, grade 3 adenocarcinoma of the cecum, pT4b,pN2b ? Loss of MSH6 expression ? PET scan 04/29/2017-residual tumor at the left adnexa, and internal iliac lymph node, and aortocaval  node ? Cycle 1 Pembrolizumab09/07/2017 ? Cycle 2 Pembrolizumab 05/30/2017 ? Cycle 3 Pembrolizumab 06/20/2017 ? Cycle4 Pembrolizumab 07/11/2017 ? Cycle 5 Pembrolizumab 08/04/2017 ? Cycle 6 pembrolizumab 08/22/2017 ? Cycle 7 pembrolizumab 09/12/2017 ? CTs 09/29/2017-previously noted retroperitoneal and right lower quadrant adenopathy has resolved, necrotic pelvic mass not visualized, single enhancing nodule at the umbilicus is nonspecific ? Cycle 8 pembrolizumab 10/03/2017 ? Cycle 9 Pembrolizumab 10/24/2017 ? Cycle 10 Pembrolizumab 11/14/2017 ? Cycle 11 pembrolizumab 12/05/2017 ? Cycle 12 pembrolizumab 12/26/2017 ? Cycle 13 Pembrolizumab 01/16/2018 ? Cycle 14 pembrolizumab 02/06/2018 ? Cycle 15 Pembrolizumab 02/27/2018 ? Cycle 16 Pembrolizumab 03/20/2018 ? CT 04/10/2018-no evidence of recurrent malignancy, stable subcutaneous lesion at the anterior abdominal wall ? Cycle 17 pembrolizumab 04/10/2018 ? Cycle 18 Pembrolizumab 05/01/2018 ? Cycle 19 Pembrolizumab 05/22/2018 ? Cycle 20 Pembrolizumab 06/12/2018 ? Cycle 21 pembrolizumab 07/03/2018 ? Cycle 22 Pembrolizumab 07/24/2018 ? Cycle 23 pembrolizumab 08/14/2018 ? Cycle 24 pembrolizumab 09/04/2018 ? Cycle 25 pembrolizumab 09/25/2018 ? Cycle 26 Pembrolizumab 10/16/2018 ? Cycle 27 pembrolizumab 11/06/2018 ? Cycle 28 pembrolizumab 12/18/2018 ? Cycle 29 Pembrolizumab 01/08/2019 ? Cycle 30Pembrolizumab 01/29/2019 ? Cycle 31 pembrolizumab 02/19/2019 ? Cycle 32 pembrolizumab 03/12/2019 ? Cycle 33 pembrolizumab 04/02/2019  2. 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 Anemia-likely iron deficiency anemia secondary to #1.Resolved.  4.G3 P3  5. Appendectomy  5. Maternal aunt with colon cancer  6.Hypothyroid-likely secondary to toxicity from pembrolizumab, thyroid hormone replacement started 08/22/2017  7.Port-A-Cath placement 11/28/2017  Disposition: Meagan [name] appears unchanged.  She will complete  another treatment with pembrolizumab today.  She will return for an office visit and the final cycle of pembrolizumab in 3 weeks.  She will be scheduled for restaging CTs within the next few weeks.  Betsy Coder, MD  04/23/2019  11:15 AM

## 2019-04-23 NOTE — Progress Notes (Signed)
Per Dr. Benay Spice: OK to treat today without labs. Only has labs every other treatment.

## 2019-04-23 NOTE — Patient Instructions (Signed)
[facility] Discharge Instructions for Patients Receiving Chemotherapy  Today you received the following chemotherapy agents:  Keytruda.  To help prevent nausea and vomiting after your treatment, we encourage you to take your nausea medication as directed.   If you develop nausea and vomiting that is not controlled by your nausea medication, call the clinic.   BELOW ARE SYMPTOMS THAT SHOULD BE REPORTED IMMEDIATELY:  *FEVER GREATER THAN 100.5 F  *CHILLS WITH OR WITHOUT FEVER  NAUSEA AND VOMITING THAT IS NOT CONTROLLED WITH YOUR NAUSEA MEDICATION  *UNUSUAL SHORTNESS OF BREATH  *UNUSUAL BRUISING OR BLEEDING  TENDERNESS IN MOUTH AND THROAT WITH OR WITHOUT PRESENCE OF ULCERS  *URINARY PROBLEMS  *BOWEL PROBLEMS  UNUSUAL RASH Items with * indicate a potential emergency and should be followed up as soon as possible.  Feel free to call the clinic should you have any questions or concerns. The clinic phone number is [phone].  Please show the CHEMO ALERT CARD at check-in to the Emergency Department and triage nurse.    

## 2019-04-26 ENCOUNTER — Telehealth: Payer: Self-pay | Admitting: Oncology

## 2019-04-26 NOTE — Telephone Encounter (Signed)
Called and spoke with patient. Confirmed dates and times of 9/9 and 9/11 appt

## 2019-05-04 ENCOUNTER — Other Ambulatory Visit: Payer: Self-pay | Admitting: Nurse Practitioner

## 2019-05-04 DIAGNOSIS — C189 Malignant neoplasm of colon, unspecified: Secondary | ICD-10-CM

## 2019-05-05 ENCOUNTER — Other Ambulatory Visit: Payer: Self-pay

## 2019-05-05 DIAGNOSIS — C189 Malignant neoplasm of colon, unspecified: Secondary | ICD-10-CM

## 2019-05-05 MED ORDER — LEVOTHYROXINE SODIUM 88 MCG PO TABS: 88.0000 ug | ORAL_TABLET | Freq: Every day | ORAL | 2 refills | Status: DC

## 2019-05-05 NOTE — Telephone Encounter (Signed)
Refill request

## 2019-05-11 ENCOUNTER — Other Ambulatory Visit: Payer: Self-pay | Admitting: Oncology

## 2019-05-12 ENCOUNTER — Other Ambulatory Visit: Payer: Self-pay

## 2019-05-12 ENCOUNTER — Ambulatory Visit (HOSPITAL_COMMUNITY)
Admission: RE | Admit: 2019-05-12 | Discharge: 2019-05-12 | Disposition: A | Discharge status: Home / Self Care | Payer: Self-pay | Source: Ambulatory Visit | Attending: Oncology | Admitting: Oncology

## 2019-05-12 ENCOUNTER — Inpatient Hospital Stay: Payer: Self-pay | Attending: Oncology

## 2019-05-12 ENCOUNTER — Inpatient Hospital Stay: Payer: Self-pay

## 2019-05-12 DIAGNOSIS — C189 Malignant neoplasm of colon, unspecified: Secondary | ICD-10-CM | POA: Insufficient documentation

## 2019-05-12 DIAGNOSIS — Z800 Family history of malignant neoplasm of digestive organs: Secondary | ICD-10-CM | POA: Insufficient documentation

## 2019-05-12 DIAGNOSIS — E039 Hypothyroidism, unspecified: Secondary | ICD-10-CM | POA: Insufficient documentation

## 2019-05-12 DIAGNOSIS — Z1509 Genetic susceptibility to other malignant neoplasm: Secondary | ICD-10-CM | POA: Insufficient documentation

## 2019-05-12 DIAGNOSIS — Z5112 Encounter for antineoplastic immunotherapy: Secondary | ICD-10-CM | POA: Insufficient documentation

## 2019-05-12 DIAGNOSIS — Z95828 Presence of other vascular implants and grafts: Secondary | ICD-10-CM

## 2019-05-12 DIAGNOSIS — D509 Iron deficiency anemia, unspecified: Secondary | ICD-10-CM | POA: Insufficient documentation

## 2019-05-12 DIAGNOSIS — Z23 Encounter for immunization: Secondary | ICD-10-CM | POA: Insufficient documentation

## 2019-05-12 DIAGNOSIS — C180 Malignant neoplasm of cecum: Secondary | ICD-10-CM | POA: Insufficient documentation

## 2019-05-12 DIAGNOSIS — Z79899 Other long term (current) drug therapy: Secondary | ICD-10-CM | POA: Insufficient documentation

## 2019-05-12 LAB — CMP (CANCER CENTER ONLY)
ALT: 10 U/L (ref 0–44)
AST: 19 U/L (ref 15–41)
Albumin: 4.1 g/dL (ref 3.5–5.0)
Alkaline Phosphatase: 62 U/L (ref 38–126)
Anion gap: 9 (ref 5–15)
BUN: 4 mg/dL — ABNORMAL LOW (ref 6–20)
CO2: 24 mmol/L (ref 22–32)
Calcium: 9.1 mg/dL (ref 8.9–10.3)
Chloride: 105 mmol/L (ref 98–111)
Creatinine: 0.74 mg/dL (ref 0.44–1.00)
GFR, Est AFR Am: >60 mL/min (ref >60)
GFR, Estimated: >60 mL/min (ref >60)
Glucose, Bld: 90 mg/dL (ref 70–99)
Potassium: 3.9 mmol/L (ref 3.5–5.1)
Sodium: 138 mmol/L (ref 135–145)
Total Bilirubin: 0.4 mg/dL (ref 0.3–1.2)
Total Protein: 7.5 g/dL (ref 6.5–8.1)

## 2019-05-12 LAB — CBC WITH DIFFERENTIAL (CANCER CENTER ONLY)
Basophils Absolute: 0.1 10*3/uL (ref 0.0–0.1)
Basophils Relative: 1 %
Eosinophils Absolute: 0.2 10*3/uL (ref 0.0–0.5)
Eosinophils Relative: 2 %
HCT: 40.9 % (ref 36.0–46.0)
Hemoglobin: 13.9 g/dL (ref 12.0–15.0)
Lymphocytes Relative: 18 %
Lymphs Abs: 1.3 10*3/uL (ref 0.7–4.0)
MCH: 31.1 pg (ref 26.0–34.0)
MCHC: 34 g/dL (ref 30.0–36.0)
MCV: 91.5 fL (ref 80.0–100.0)
Monocytes Absolute: 0.5 10*3/uL (ref 0.1–1.0)
Monocytes Relative: 7 %
Neutro Abs: 5.4 10*3/uL (ref 1.7–7.7)
Neutrophils Relative %: 73 %
Platelet Count: 237 10*3/uL (ref 150–400)
RBC: 4.47 MIL/uL (ref 3.87–5.11)
RDW: 12.6 % (ref 11.5–15.5)
WBC Count: 7.5 10*3/uL (ref 4.0–10.5)
nRBC: 0 % (ref 0.0–0.2)

## 2019-05-12 LAB — TSH: TSH: 34.274 u[IU]/mL — ABNORMAL HIGH (ref 0.308–3.960)

## 2019-05-12 LAB — CEA (IN HOUSE-CHCC): CEA (CHCC-In House): 4.72 ng/mL (ref 0.00–5.00)

## 2019-05-12 MED ORDER — IOHEXOL 300 MG/ML  SOLN
100.0000 mL | Freq: Once | INTRAMUSCULAR | Status: AC | PRN
  Administered 2019-05-12: 100 mL via INTRAVENOUS

## 2019-05-12 MED ORDER — SODIUM CHLORIDE 0.9% FLUSH
10.0000 mL | Freq: Once | INTRAVENOUS | Status: AC
  Administered 2019-05-12: 10 mL
  Filled 2019-05-12: qty 10

## 2019-05-12 MED ORDER — HEPARIN SOD (PORK) LOCK FLUSH 100 UNIT/ML IV SOLN: 500.0000 [IU] | Freq: Once | INTRAVENOUS | Status: DC

## 2019-05-12 MED ORDER — HEPARIN SOD (PORK) LOCK FLUSH 100 UNIT/ML IV SOLN
INTRAVENOUS | Status: AC
  Administered 2019-05-12: 500 [IU]
  Filled 2019-05-12: qty 5

## 2019-05-12 MED ORDER — SODIUM CHLORIDE (PF) 0.9 % IJ SOLN
INTRAMUSCULAR | Status: AC
  Filled 2019-05-12: qty 50

## 2019-05-13 ENCOUNTER — Telehealth: Payer: Self-pay

## 2019-05-13 NOTE — Telephone Encounter (Signed)
TC to pt per Dr. Benay Spice to see if she is taking the thyroid hormone? Patient verbalized that she was. I let her know that Dr. Benay Spice would like for her toIncrease the Synthroid dose to 100 mcg daily if she has been taking it consistently. Pt verbalized understanding. No further problem or concerns at this time.

## 2019-05-13 NOTE — Telephone Encounter (Signed)
TC to pt per Dr Benay Spice to let her know that her CTs negative for progressive cancer, and to follow-up as scheduled.

## 2019-05-14 ENCOUNTER — Inpatient Hospital Stay: Payer: Medicaid Other

## 2019-05-14 ENCOUNTER — Other Ambulatory Visit: Payer: Medicaid Other

## 2019-05-14 ENCOUNTER — Other Ambulatory Visit: Payer: Self-pay

## 2019-05-14 ENCOUNTER — Inpatient Hospital Stay (HOSPITAL_BASED_OUTPATIENT_CLINIC_OR_DEPARTMENT_OTHER): Payer: Self-pay | Admitting: Oncology

## 2019-05-14 VITALS — BP 102/73 | HR 81 | Temp 98.9°F | Resp 18 | Ht 62.0 in | Wt 154.6 lb

## 2019-05-14 DIAGNOSIS — C180 Malignant neoplasm of cecum: Secondary | ICD-10-CM

## 2019-05-14 DIAGNOSIS — E039 Hypothyroidism, unspecified: Secondary | ICD-10-CM

## 2019-05-14 DIAGNOSIS — C189 Malignant neoplasm of colon, unspecified: Secondary | ICD-10-CM

## 2019-05-14 MED ORDER — LEVOTHYROXINE SODIUM 88 MCG PO TABS: 88.0000 ug | ORAL_TABLET | Freq: Every day | ORAL | 0 refills | Status: DC

## 2019-05-14 MED ORDER — LEVOTHYROXINE SODIUM 100 MCG PO TABS: 100.0000 ug | ORAL_TABLET | Freq: Every day | ORAL | 0 refills | Status: DC

## 2019-05-14 MED ORDER — HEPARIN SOD (PORK) LOCK FLUSH 100 UNIT/ML IV SOLN
500.0000 [IU] | Freq: Once | INTRAVENOUS | Status: AC | PRN
  Administered 2019-05-14: 500 [IU]
  Filled 2019-05-14: qty 5

## 2019-05-14 MED ORDER — SODIUM CHLORIDE 0.9 % IV SOLN
Freq: Once | INTRAVENOUS | Status: AC
  Administered 2019-05-14: 12:00:00 via INTRAVENOUS
  Filled 2019-05-14: qty 250

## 2019-05-14 MED ORDER — INFLUENZA VAC SPLIT QUAD 0.5 ML IM SUSY
0.5000 mL | PREFILLED_SYRINGE | Freq: Once | INTRAMUSCULAR | Status: AC
  Administered 2019-05-14: 0.5 mL via INTRAMUSCULAR
  Filled 2019-05-14: qty 0.5

## 2019-05-14 MED ORDER — SODIUM CHLORIDE 0.9 % IV SOLN
200.0000 mg | Freq: Once | INTRAVENOUS | Status: AC
  Administered 2019-05-14: 200 mg via INTRAVENOUS
  Filled 2019-05-14: qty 8

## 2019-05-14 MED ORDER — SODIUM CHLORIDE 0.9% FLUSH
10.0000 mL | INTRAVENOUS | Status: DC | PRN
  Administered 2019-05-14: 10 mL
  Filled 2019-05-14: qty 10

## 2019-05-14 NOTE — Progress Notes (Signed)
[facility] OFFICE PROGRESS NOTE   Diagnosis: Colon cancer  INTERVAL HISTORY:   Meagan [name] completed another cycle of pembrolizumab on 04/23/2019.  She feels well.  No new complaint.  She has not been taking thyroid hormone consistently.  She estimates taking thyroid hormone half of the time.  Objective:  Vital signs in last 24 hours:  Blood pressure 102/73, pulse 81, temperature 98.9 F (37.2 C), temperature source Oral, resp. rate 18, height 5' 2"  (1.575 m), weight 154 lb 9.6 oz (70.1 kg), SpO2 100 %.   Limited physical examination secondary to distancing with the COVID pandemic HEENT: Neck without mass Lymphatics: No cervical, supraclavicular, axillary, or inguinal nodes GI: Nontender, no hepatosplenomegaly, no mass Vascular: No leg edema  Portacath/PICC-without erythema  Lab Results:  Lab Results  Component Value Date   WBC 7.5 05/12/2019   HGB 13.9 05/12/2019   HCT 40.9 05/12/2019   MCV 91.5 05/12/2019   PLT 237 05/12/2019   NEUTROABS 5.4 05/12/2019    CMP  Lab Results  Component Value Date   NA 138 05/12/2019   K 3.9 05/12/2019   CL 105 05/12/2019   CO2 24 05/12/2019   GLUCOSE 90 05/12/2019   BUN 4 (L) 05/12/2019   CREATININE 0.74 05/12/2019   CALCIUM 9.1 05/12/2019   PROT 7.5 05/12/2019   ALBUMIN 4.1 05/12/2019   AST 19 05/12/2019   ALT 10 05/12/2019   ALKPHOS 62 05/12/2019   BILITOT 0.4 05/12/2019   GFRNONAA >60 05/12/2019   GFRAA >60 05/12/2019    Lab Results  Component Value Date   CEA1 4.72 05/12/2019   TSH on 05/12/2019: 34.27  Imaging:  Ct Chest W Contrast  Result Date: 05/12/2019 CLINICAL DATA:  Restaging colon cancer, status post colon resection, status post appendectomy, status post right salpingoophorectomy EXAM: CT CHEST, ABDOMEN, AND PELVIS WITH CONTRAST TECHNIQUE: Multidetector CT imaging of the chest, abdomen and pelvis was performed following the standard protocol during bolus administration of intravenous  contrast. CONTRAST:  158m OMNIPAQUE IOHEXOL 300 MG/ML SOLN, additional oral enteric contrast COMPARISON:  04/09/2018, 03/29/2017 FINDINGS: CT CHEST FINDINGS Cardiovascular: Right chest port catheter. Normal heart size. No pericardial effusion. Mediastinum/Nodes: No enlarged mediastinal, hilar, or axillary lymph nodes. Thyroid gland, trachea, and esophagus demonstrate no significant findings. Lungs/Pleura: Lungs are clear. No pleural effusion or pneumothorax. Musculoskeletal: No chest wall mass or suspicious bone lesions identified. CT ABDOMEN PELVIS FINDINGS Hepatobiliary: No solid liver abnormality is seen. No gallstones, gallbladder wall thickening, or biliary dilatation. Pancreas: Unremarkable. No pancreatic ductal dilatation or surrounding inflammatory changes. Spleen: Normal in size without significant abnormality. Adrenals/Urinary Tract: Adrenal glands are unremarkable. Kidneys are normal, without renal calculi, solid lesion, or hydronephrosis. Bladder is unremarkable. Stomach/Bowel: Stomach is within normal limits. Status post cecal resection and reanastomosis. No evidence of bowel wall thickening, distention, or inflammatory changes. Vascular/Lymphatic: No significant vascular findings are present. No change in small prominent pelvic sidewall, iliac, and retroperitoneal lymph nodes, an index right pelvic sidewall node measuring 1.5 x 0.3 cm (series 2, image 102). Reproductive: No mass or other abnormality.  Left ovarian cyst. Other: No abdominal wall hernia or abnormality. Unchanged postoperative findings of low midline laparotomy. No abdominopelvic ascites. Musculoskeletal: No acute or significant osseous findings. IMPRESSION: 1. Redemonstrated postoperative findings of cecal resection and reanastomosis as well as right salpingoophorectomy. There is no evidence of a large abdominopelvic mass seen on initial examination and no evidence of abdominal or pelvic lymphadenopathy. No change in small prominent  pelvic sidewall, iliac,  and retroperitoneal lymph nodes, an index right pelvic sidewall node measuring 1.5 x 0.3 cm (series 2, image 102). 2.  No evidence of distant metastatic disease. Electronically Signed   By: Eddie Candle M.D.   On: 05/12/2019 11:05   Ct Abdomen Pelvis W Contrast  Result Date: 05/12/2019 CLINICAL DATA:  Restaging colon cancer, status post colon resection, status post appendectomy, status post right salpingoophorectomy EXAM: CT CHEST, ABDOMEN, AND PELVIS WITH CONTRAST TECHNIQUE: Multidetector CT imaging of the chest, abdomen and pelvis was performed following the standard protocol during bolus administration of intravenous contrast. CONTRAST:  1100m OMNIPAQUE IOHEXOL 300 MG/ML SOLN, additional oral enteric contrast COMPARISON:  04/09/2018, 03/29/2017 FINDINGS: CT CHEST FINDINGS Cardiovascular: Right chest port catheter. Normal heart size. No pericardial effusion. Mediastinum/Nodes: No enlarged mediastinal, hilar, or axillary lymph nodes. Thyroid gland, trachea, and esophagus demonstrate no significant findings. Lungs/Pleura: Lungs are clear. No pleural effusion or pneumothorax. Musculoskeletal: No chest wall mass or suspicious bone lesions identified. CT ABDOMEN PELVIS FINDINGS Hepatobiliary: No solid liver abnormality is seen. No gallstones, gallbladder wall thickening, or biliary dilatation. Pancreas: Unremarkable. No pancreatic ductal dilatation or surrounding inflammatory changes. Spleen: Normal in size without significant abnormality. Adrenals/Urinary Tract: Adrenal glands are unremarkable. Kidneys are normal, without renal calculi, solid lesion, or hydronephrosis. Bladder is unremarkable. Stomach/Bowel: Stomach is within normal limits. Status post cecal resection and reanastomosis. No evidence of bowel wall thickening, distention, or inflammatory changes. Vascular/Lymphatic: No significant vascular findings are present. No change in small prominent pelvic sidewall, iliac, and  retroperitoneal lymph nodes, an index right pelvic sidewall node measuring 1.5 x 0.3 cm (series 2, image 102). Reproductive: No mass or other abnormality.  Left ovarian cyst. Other: No abdominal wall hernia or abnormality. Unchanged postoperative findings of low midline laparotomy. No abdominopelvic ascites. Musculoskeletal: No acute or significant osseous findings. IMPRESSION: 1. Redemonstrated postoperative findings of cecal resection and reanastomosis as well as right salpingoophorectomy. There is no evidence of a large abdominopelvic mass seen on initial examination and no evidence of abdominal or pelvic lymphadenopathy. No change in small prominent pelvic sidewall, iliac, and retroperitoneal lymph nodes, an index right pelvic sidewall node measuring 1.5 x 0.3 cm (series 2, image 102). 2.  No evidence of distant metastatic disease. Electronically Signed   By: AEddie CandleM.D.   On: 05/12/2019 11:05    Medications: I have reviewed the patient's current medications.   Assessment/Plan: 1. Stage IV Colon cancer, cecum, presenting with a palpable low abdominal/pelvic mass ? Colonoscopy 03/31/2017 confirmed a cecum mass, biopsy positive for poorly differentiated adenocarcinoma ? CT abdomen/pelvis 03/29/2017-large necrotic right iliac fossa mass, enlarged right common iliac nodes, loculated pelvic ascites, right lower quadrant mesenteric masses ? Right colectomy/right oophorectomy-salpingectomy 04/04/2017 ? Pathology from the right colon resection, grade 3 adenocarcinoma of the cecum, pT4b,pN2b ? Loss of MSH6 expression ? PET scan 04/29/2017-residual tumor at the left adnexa, and internal iliac lymph node, and aortocaval node ? Cycle 1 Pembrolizumab09/07/2017 ? Cycle 2 Pembrolizumab 05/30/2017 ? Cycle 3 Pembrolizumab 06/20/2017 ? Cycle4 Pembrolizumab 07/11/2017 ? Cycle 5 Pembrolizumab 08/04/2017 ? Cycle 6 pembrolizumab 08/22/2017 ? Cycle 7 pembrolizumab 09/12/2017 ? CTs 09/29/2017-previously noted  retroperitoneal and right lower quadrant adenopathy has resolved, necrotic pelvic mass not visualized, single enhancing nodule at the umbilicus is nonspecific ? Cycle 8 pembrolizumab 10/03/2017 ? Cycle 9 Pembrolizumab 10/24/2017 ? Cycle 10 Pembrolizumab 11/14/2017 ? Cycle 11 pembrolizumab 12/05/2017 ? Cycle 12 pembrolizumab 12/26/2017 ? Cycle 13 Pembrolizumab 01/16/2018 ? Cycle 14 pembrolizumab 02/06/2018 ? Cycle 15 Pembrolizumab 02/27/2018 ?  Cycle 16 Pembrolizumab 03/20/2018 ? CT 04/10/2018-no evidence of recurrent malignancy, stable subcutaneous lesion at the anterior abdominal wall ? Cycle 17 pembrolizumab 04/10/2018 ? Cycle 18 Pembrolizumab 05/01/2018 ? Cycle 19 Pembrolizumab 05/22/2018 ? Cycle 20 Pembrolizumab 06/12/2018 ? Cycle 21 pembrolizumab 07/03/2018 ? Cycle 22 Pembrolizumab 07/24/2018 ? Cycle 23 pembrolizumab 08/14/2018 ? Cycle 24 pembrolizumab 09/04/2018 ? Cycle 25 pembrolizumab 09/25/2018 ? Cycle 26 Pembrolizumab 10/16/2018 ? Cycle 27 pembrolizumab 11/06/2018 ? Cycle 28 pembrolizumab 12/18/2018 ? Cycle 29 Pembrolizumab 01/08/2019 ? Cycle 30Pembrolizumab 01/29/2019 ? Cycle 31 pembrolizumab 02/19/2019 ? Cycle 32 pembrolizumab 03/12/2019 ? Cycle 33 pembrolizumab 04/02/2019 ? Cycle 34 pembrolizumab 04/23/2019 ? Cycle 35 pembrolizumab 05/14/2019 ? CTs 05/12/2019- no evidence of recurrent disease  2. 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 Anemia-likely iron deficiency anemia secondary to #1.Resolved.  4.G3 P3  5. Appendectomy  5. Maternal aunt with colon cancer  6.Hypothyroid-likely secondary to toxicity from pembrolizumab, thyroid hormone replacement started 08/22/2017  7.Port-A-Cath placement 11/28/2017     Disposition: Ms. Racey is in remission from colon cancer.  The restaging CT showed no evidence of recurrent disease.  She has completed 2 years of immunotherapy.  Pembrolizumab will be discontinued.  She will be referred for  Port-A-Cath removal.  She will return for an office visit and CEA in 3 months.  The TSH was higher this week.  She is not taking thyroid hormone consistently.  I encouraged her to take the thyroid replacement daily.  She will return for a thyroid panel in 3 weeks.  I reviewed the CT images.  Betsy Coder, MD  05/14/2019  10:41 AM

## 2019-05-14 NOTE — Addendum Note (Signed)
Addended by: Tania Ade on: 05/14/2019 11:36 AM   Modules accepted: Orders

## 2019-05-17 ENCOUNTER — Telehealth: Payer: Self-pay | Admitting: Oncology

## 2019-05-17 NOTE — Telephone Encounter (Signed)
Called and spoke with patient. Confirmed appts  °

## 2019-05-18 ENCOUNTER — Telehealth: Payer: Self-pay | Admitting: *Deleted

## 2019-05-18 NOTE — Telephone Encounter (Signed)
VM from [place] with [facility] IR department that they can draw her labs scheduled for 06/04/19 when the remove her port that day. Left VM to approve this and lab at [facility] canceled.

## 2019-06-03 ENCOUNTER — Other Ambulatory Visit: Payer: Self-pay | Admitting: Radiology

## 2019-06-04 ENCOUNTER — Encounter (HOSPITAL_COMMUNITY): Payer: Self-pay

## 2019-06-04 ENCOUNTER — Ambulatory Visit (HOSPITAL_COMMUNITY)
Admission: RE | Admit: 2019-06-04 | Discharge: 2019-06-04 | Disposition: A | Discharge status: Home / Self Care | Payer: Self-pay | Source: Ambulatory Visit | Attending: Oncology | Admitting: Oncology

## 2019-06-04 ENCOUNTER — Other Ambulatory Visit: Payer: Self-pay

## 2019-06-04 ENCOUNTER — Other Ambulatory Visit: Payer: Medicaid Other

## 2019-06-04 DIAGNOSIS — E039 Hypothyroidism, unspecified: Secondary | ICD-10-CM | POA: Insufficient documentation

## 2019-06-04 DIAGNOSIS — Z79890 Hormone replacement therapy (postmenopausal): Secondary | ICD-10-CM | POA: Insufficient documentation

## 2019-06-04 DIAGNOSIS — C180 Malignant neoplasm of cecum: Secondary | ICD-10-CM | POA: Insufficient documentation

## 2019-06-04 DIAGNOSIS — Z452 Encounter for adjustment and management of vascular access device: Secondary | ICD-10-CM | POA: Insufficient documentation

## 2019-06-04 DIAGNOSIS — Z1509 Genetic susceptibility to other malignant neoplasm: Secondary | ICD-10-CM | POA: Insufficient documentation

## 2019-06-04 DIAGNOSIS — Z9221 Personal history of antineoplastic chemotherapy: Secondary | ICD-10-CM | POA: Insufficient documentation

## 2019-06-04 HISTORY — PX: IR REMOVAL TUN ACCESS W/ PORT W/O FL MOD SED: IMG2290

## 2019-06-04 LAB — CBC WITH DIFFERENTIAL/PLATELET
Abs Immature Granulocytes: 0.03 10*3/uL (ref 0.00–0.07)
Basophils Absolute: 0.1 10*3/uL (ref 0.0–0.1)
Basophils Relative: 1 %
Eosinophils Absolute: 0.1 10*3/uL (ref 0.0–0.5)
Eosinophils Relative: 2 %
HCT: 42.6 % (ref 36.0–46.0)
Hemoglobin: 14.2 g/dL (ref 12.0–15.0)
Immature Granulocytes: 0 %
Lymphocytes Relative: 22 %
Lymphs Abs: 1.7 10*3/uL (ref 0.7–4.0)
MCH: 31.4 pg (ref 26.0–34.0)
MCHC: 33.3 g/dL (ref 30.0–36.0)
MCV: 94.2 fL (ref 80.0–100.0)
Monocytes Absolute: 0.6 10*3/uL (ref 0.1–1.0)
Monocytes Relative: 7 %
Neutro Abs: 5.3 10*3/uL (ref 1.7–7.7)
Neutrophils Relative %: 68 %
Platelets: 241 10*3/uL (ref 150–400)
RBC: 4.52 MIL/uL (ref 3.87–5.11)
RDW: 12.6 % (ref 11.5–15.5)
WBC: 7.8 10*3/uL (ref 4.0–10.5)
nRBC: 0 % (ref 0.0–0.2)

## 2019-06-04 LAB — TSH: TSH: 11.038 u[IU]/mL — ABNORMAL HIGH (ref 0.350–4.500)

## 2019-06-04 LAB — PROTIME-INR
INR: 1 (ref 0.8–1.2)
Prothrombin Time: 12.6 seconds (ref 11.4–15.2)

## 2019-06-04 LAB — T4, FREE: Free T4: 1.04 ng/dL (ref 0.61–1.12)

## 2019-06-04 MED ORDER — LIDOCAINE HCL 1 % IJ SOLN
INTRAMUSCULAR | Status: AC
  Filled 2019-06-04: qty 20

## 2019-06-04 MED ORDER — CEFAZOLIN SODIUM-DEXTROSE 2-4 GM/100ML-% IV SOLN
INTRAVENOUS | Status: AC
  Administered 2019-06-04: 14:00:00 2 g via INTRAVENOUS
  Filled 2019-06-04: qty 100

## 2019-06-04 MED ORDER — SODIUM CHLORIDE 0.9 % IV SOLN
INTRAVENOUS | Status: DC
  Administered 2019-06-04: 12:00:00 via INTRAVENOUS

## 2019-06-04 MED ORDER — LIDOCAINE HCL (PF) 1 % IJ SOLN
INTRAMUSCULAR | Status: AC | PRN
  Administered 2019-06-04: 10 mL

## 2019-06-04 MED ORDER — CEFAZOLIN SODIUM-DEXTROSE 2-4 GM/100ML-% IV SOLN
2.0000 g | INTRAVENOUS | Status: AC
  Administered 2019-06-04: 14:00:00 2 g via INTRAVENOUS

## 2019-06-04 MED ORDER — FENTANYL CITRATE (PF) 100 MCG/2ML IJ SOLN
INTRAMUSCULAR | Status: AC
  Filled 2019-06-04: qty 4

## 2019-06-04 MED ORDER — FENTANYL CITRATE (PF) 100 MCG/2ML IJ SOLN
INTRAMUSCULAR | Status: AC | PRN
  Administered 2019-06-04 (×3): 25 ug via INTRAVENOUS
  Administered 2019-06-04 (×2): 50 ug via INTRAVENOUS
  Administered 2019-06-04: 25 ug via INTRAVENOUS

## 2019-06-04 MED ORDER — MIDAZOLAM HCL 2 MG/2ML IJ SOLN
INTRAMUSCULAR | Status: AC
  Filled 2019-06-04: qty 4

## 2019-06-04 MED ORDER — MIDAZOLAM HCL 2 MG/2ML IJ SOLN
INTRAMUSCULAR | Status: AC | PRN
  Administered 2019-06-04 (×2): 0.5 mg via INTRAVENOUS
  Administered 2019-06-04 (×3): 1 mg via INTRAVENOUS

## 2019-06-04 NOTE — Discharge Instructions (Addendum)
Implanted Port Removal, Care After °This sheet gives you information about how to care for yourself after your procedure. Your health care provider may also give you more specific instructions. If you have problems or questions, contact your health care provider. °What can I expect after the procedure? °After the procedure, it is common to have: °· Soreness or pain near your incision. °· Some swelling or bruising near your incision. °Follow these instructions at home: °Medicines °· Take over-the-counter and prescription medicines only as told by your health care provider. °· If you were prescribed an antibiotic medicine, take it as told by your health care provider. Do not stop taking the antibiotic even if you start to feel better. °Bathing °· Do not take baths, swim, or use a hot tub until your health care provider approves. Ask your health care provider if you can take showers. You may only be allowed to take sponge baths. °Incision care ° °· Follow instructions from your health care provider about how to take care of your incision. Make sure you: °? Wash your hands with soap and water before you change your bandage (dressing). If soap and water are not available, use hand sanitizer. °? Change your dressing as told by your health care provider. °? Keep your dressing dry. °? Leave stitches (sutures), skin glue, or adhesive strips in place. These skin closures may need to stay in place for 2 weeks or longer. If adhesive strip edges start to loosen and curl up, you may trim the loose edges. Do not remove adhesive strips completely unless your health care provider tells you to do that. °· Check your incision area every day for signs of infection. Check for: °? More redness, swelling, or pain. °? More fluid or blood. °? Warmth. °? Pus or a bad smell. °Driving ° °· Do not drive for 24 hours if you were given a medicine to help you relax (sedative) during your procedure. °· If you did not receive a sedative, ask your  health care provider when it is safe to drive. °Activity °· Return to your normal activities as told by your health care provider. Ask your health care provider what activities are safe for you. °· Do not lift anything that is heavier than 10 lb (4.5 kg), or the limit that you are told, until your health care provider says that it is safe. °· Do not do activities that involve lifting your arms over your head. °General instructions °· Do not use any products that contain nicotine or tobacco, such as cigarettes and e-cigarettes. These can delay healing. If you need help quitting, ask your health care provider. °· Keep all follow-up visits as told by your health care provider. This is important. °Contact a health care provider if: °· You have more redness, swelling, or pain around your incision. °· You have more fluid or blood coming from your incision. °· Your incision feels warm to the touch. °· You have pus or a bad smell coming from your incision. °· You have pain that is not relieved by your pain medicine. °Get help right away if you have: °· A fever or chills. °· Chest pain. °· Difficulty breathing. °Summary °· After the procedure, it is common to have pain, soreness, swelling, or bruising near your incision. °· If you were prescribed an antibiotic medicine, take it as told by your health care provider. Do not stop taking the antibiotic even if you start to feel better. °· Do not drive for 24 hours   if you were given a sedative during your procedure. °· Return to your normal activities as told by your health care provider. Ask your health care provider what activities are safe for you. °This information is not intended to replace advice given to you by your health care provider. Make sure you discuss any questions you have with your health care provider. °Document Released: 07/31/2015 Document Revised: 10/02/2017 Document Reviewed: 10/02/2017 °Elsevier Patient Education © 2020 [employer] ° ° ° °Moderate  Conscious Sedation, Adult, Care After °These instructions provide you with information about caring for yourself after your procedure. Your health care provider may also give you more specific instructions. Your treatment has been planned according to current medical practices, but problems sometimes occur. Call your health care provider if you have any problems or questions after your procedure. °What can I expect after the procedure? °After your procedure, it is common: °· To feel sleepy for several hours. °· To feel clumsy and have poor balance for several hours. °· To have poor judgment for several hours. °· To vomit if you eat too soon. °Follow these instructions at home: °For at least 24 hours after the procedure: ° °· Do not: °? Participate in activities where you could fall or become injured. °? Drive. °? Use heavy machinery. °? Drink alcohol. °? Take sleeping pills or medicines that cause drowsiness. °? Make important decisions or sign legal documents. °? Take care of children on your own. °· Rest. °Eating and drinking °· Follow the diet recommended by your health care provider. °· If you vomit: °? Drink water, juice, or soup when you can drink without vomiting. °? Make sure you have little or no nausea before eating solid foods. °General instructions °· Have a responsible adult stay with you until you are awake and alert. °· Take over-the-counter and prescription medicines only as told by your health care provider. °· If you smoke, do not smoke without supervision. °· Keep all follow-up visits as told by your health care provider. This is important. °Contact a health care provider if: °· You keep feeling nauseous or you keep vomiting. °· You feel light-headed. °· You develop a rash. °· You have a fever. °Get help right away if: °· You have trouble breathing. °This information is not intended to replace advice given to you by your health care provider. Make sure you discuss any questions you have with your  health care provider. °Document Released: 06/09/2013 Document Revised: 08/01/2017 Document Reviewed: 12/09/2015 °Elsevier Patient Education © 2020 [employer] Inc. ° °

## 2019-06-04 NOTE — Procedures (Signed)
Interventional Radiology Procedure Note  Procedure: Single Lumen Power Port Placement    Access:  Right IJ vein.  Findings: Catheter tip positioned at SVC/RA junction. Port is ready for immediate use.   Complications: None  EBL: < 10 mL  Recommendations:  - Ok to shower in 24 hours - Do not submerge for 7 days - Routine line care   Johnice Riebe T. Annelie Boak, M.D Pager:  [phone]   

## 2019-06-04 NOTE — H&P (Signed)
Referring Physician(s): Ladell Pier  Supervising Physician: Aletta Edouard  Patient Status:  [facility] OP  Chief Complaint:  "I'm getting my port out"  Subjective: Patient familiar to IR service from Port-A-Cath placement on 11/28/2017.  She has a history of colon cancer initially diagnosed in 2018, status post surgery/immunotherapy.  Past medical history also significant for Lynch syndrome, hypothyroidism.  She is currently in clinical remission and presents today for Port-A-Cath removal.  She currently denies fever, headache, chest pain, dyspnea, cough, abdominal/back pain, nausea, vomiting or bleeding.  She is anxious.  Past Medical History:  Diagnosis Date  . adenocarcinoma of colon dx'd 04/2017   immunotherapy  . Fallopian tube abscess    June 2018 right side  . Genetic testing 05/07/2017   Ms. Hertzberg underwent genetic counseling and testing for hereditary cancer syndromes on 04/18/2017. 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 Mutations in MSH6 are associated with a hereditary cancer syndrome called Lynch syndrome. For more detailed discussion, please see genetic counseling documentation from 05/07/2017.  Testing was perform  . 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 Cedar underwent genetic counseling and testing for hereditary cancer syndromes on 04/18/2017. 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 Mutations in MSH6 are associated with a hereditary cancer syndrome called Lynch syndrome. For more detailed discussion, please see genetic counseling documentation from 05/07/2017.  Testing was perform  . Ovarian cyst    Past Surgical History:  Procedure Laterality Date  . APPENDECTOMY  2010  . COLON RESECTION Right 04/04/2017   Procedure: OPEN RIGHT COLECTOMY AND RIGHT SALPINGO-OOPHORECTOMY;  Surgeon: Armandina Gemma, MD;  Location: [facility] ORS;  Service: General;   Laterality: Right;  . COLONOSCOPY N/A 03/31/2017   Procedure: COLONOSCOPY;  Surgeon: Mauri Pole, MD;  Location: [facility] END[facility];  Service: Endoscopy;  Laterality: N/A;  . CYSTOSCOPY WITH STENT PLACEMENT Bilateral 04/04/2017   Procedure: CYSTOSCOPY WITH STENT PLACEMENT;  Surgeon: Alexis Frock, MD;  Location: [facility] ORS;  Service: Urology;  Laterality: Bilateral;  . IR FLUORO GUIDE PORT INSERTION RIGHT  11/28/2017  . IR US GUIDE VASC ACCESS RIGHT  11/28/2017     Allergies: Patient has no known allergies.  Medications: Prior to Admission medications   Medication Sig Start Date End Date Taking? Authorizing Provider  levothyroxine (SYNTHROID) 88 MCG tablet Take 1 tablet (88 mcg total) by mouth daily before breakfast. 05/14/19  Yes Ladell Pier, MD  lidocaine-prilocaine (EMLA) cream Apply to port site one hour prior to use. Do not rub in. Cover with plastic. 02/27/18  Yes Owens Shark, NP  etonogestrel (NEXPLANON) 68 MG IMPL implant Inject into the skin.    [provider]  LORazepam (ATIVAN) 0.5 MG tablet Take 1 tablet (0.5 mg total) by mouth every 8 (eight) hours as needed for anxiety. for anxiety Patient not taking: Reported on 05/14/2019 07/03/18   Ladell Pier, MD     Vital Signs: Blood pressure 123/83, temp 98.4, heart rate 95, respirations 14, O2 sat 99% room air   Physical Exam awake, alert.  Chest clear to auscultation bilaterally.  Heart with regular rate and rhythm.  Abdomen soft, positive bowel sounds, nontender.  No lower extremity edema  Imaging: No results found.  Labs:  CBC: Recent Labs    03/12/19 0908 04/02/19 0840 05/12/19 0914 06/04/19 1220  WBC 8.3 6.8 7.5 7.8  HGB 13.7 13.5 13.9 14.2  HCT 40.9 40.6 40.9 42.6  PLT 227 207 237 241    COAGS: Recent Labs    06/04/19 1220  INR 1.0    BMP: Recent Labs    02/19/19 0920 03/12/19 0908 04/02/19 0840 05/12/19 0914  NA 137 137 138 138  K 4.0 4.0 4.0 3.9  CL 105 105 107 105  CO2 24 26  23 24   GLUCOSE 80 89 89 90  BUN 8 6 7  4*  CALCIUM 8.9 8.7* 9.2 9.1  CREATININE 0.77 0.74 0.75 0.74  GFRNONAA >60 >60 >60 >60  GFRAA >60 >60 >60 >60    LIVER FUNCTION TESTS: Recent Labs    02/19/19 0920 03/12/19 0908 04/02/19 0840 05/12/19 0914  BILITOT 0.4 0.4 0.4 0.4  AST 12* 12* 16 19  ALT 8 7 13 10   ALKPHOS 62 64 62 62  PROT 7.6 7.3 7.4 7.5  ALBUMIN 3.9 3.7 3.9 4.1    Assessment and Plan: Pt with history of colon cancer initially diagnosed in 2018, status post surgery/immunotherapy.  Past medical history also significant for Lynch syndrome, hypothyroidism.  She is currently in clinical remission and presents today for Port-A-Cath removal.  Details/risks of procedure, including but not limited to, internal bleeding, infection, injury to adjacent structures discussed with patient with understanding and consent.   Electronically Signed: D. Rowe Robert, PA-C 06/04/2019, 1:30 PM   I spent a total of 20 minutes at the the patient's bedside AND on the patient's hospital floor or unit, greater than 50% of which was counseling/coordinating care for Port-A-Cath removal

## 2019-06-05 LAB — T3 UPTAKE: T3 Uptake Ratio: 29 % (ref 24–39)

## 2019-06-05 LAB — T4: T4, Total: 8.3 ug/dL (ref 4.5–12.0)

## 2019-08-13 ENCOUNTER — Other Ambulatory Visit: Payer: Self-pay

## 2019-08-13 ENCOUNTER — Encounter: Payer: Self-pay | Admitting: Nurse Practitioner

## 2019-08-13 ENCOUNTER — Inpatient Hospital Stay: Payer: Medicaid Other

## 2019-08-13 ENCOUNTER — Inpatient Hospital Stay: Payer: Medicaid Other | Attending: Oncology | Admitting: Nurse Practitioner

## 2019-08-13 VITALS — BP 116/79 | HR 91 | Temp 97.9°F | Resp 18 | Ht 62.0 in | Wt 134.3 lb

## 2019-08-13 DIAGNOSIS — E039 Hypothyroidism, unspecified: Secondary | ICD-10-CM

## 2019-08-13 DIAGNOSIS — Z800 Family history of malignant neoplasm of digestive organs: Secondary | ICD-10-CM | POA: Diagnosis not present

## 2019-08-13 DIAGNOSIS — C180 Malignant neoplasm of cecum: Secondary | ICD-10-CM

## 2019-08-13 DIAGNOSIS — Z85038 Personal history of other malignant neoplasm of large intestine: Secondary | ICD-10-CM | POA: Insufficient documentation

## 2019-08-13 DIAGNOSIS — Z90721 Acquired absence of ovaries, unilateral: Secondary | ICD-10-CM | POA: Insufficient documentation

## 2019-08-13 DIAGNOSIS — Z1509 Genetic susceptibility to other malignant neoplasm: Secondary | ICD-10-CM | POA: Diagnosis not present

## 2019-08-13 DIAGNOSIS — Z79899 Other long term (current) drug therapy: Secondary | ICD-10-CM | POA: Diagnosis not present

## 2019-08-13 DIAGNOSIS — Z9049 Acquired absence of other specified parts of digestive tract: Secondary | ICD-10-CM | POA: Diagnosis not present

## 2019-08-13 DIAGNOSIS — Z9079 Acquired absence of other genital organ(s): Secondary | ICD-10-CM | POA: Insufficient documentation

## 2019-08-13 DIAGNOSIS — C189 Malignant neoplasm of colon, unspecified: Secondary | ICD-10-CM | POA: Diagnosis not present

## 2019-08-13 DIAGNOSIS — Z9221 Personal history of antineoplastic chemotherapy: Secondary | ICD-10-CM | POA: Insufficient documentation

## 2019-08-13 NOTE — Progress Notes (Signed)
[facility] OFFICE PROGRESS NOTE   Diagnosis: Colon cancer  INTERVAL HISTORY:   Meagan [name] returns as scheduled.  Overall she is feeling well.  A few months ago she lost her appetite due to issues in her personal life.  She now has a good appetite and is gaining weight.  No change in bowel habits.  No bleeding with bowel movements.  No abdominal pain.  She is taking Synthroid consistently.  Objective:  Vital signs in last 24 hours:  Blood pressure 116/79, pulse 91, temperature 97.9 F (36.6 C), temperature source Temporal, resp. rate 18, height 5' 2" (1.575 m), weight 134 lb 4.8 oz (60.9 kg), SpO2 98 %.    HEENT: Neck without mass. Lymphatics: No palpable cervical, supraclavicular, axillary or inguinal lymph nodes. Resp: Lungs clear bilaterally. Cardio: Regular rate and rhythm. GI: Abdomen soft and nontender.  No hepatomegaly. Vascular: No leg edema.   Lab Results:  Lab Results  Component Value Date   WBC 7.8 06/04/2019   HGB 14.2 06/04/2019   HCT 42.6 06/04/2019   MCV 94.2 06/04/2019   PLT 241 06/04/2019   NEUTROABS 5.3 06/04/2019    Imaging:  No results found.  Medications: I have reviewed the patient's current medications.  Assessment/Plan: 1. Stage IV Colon cancer, cecum, presenting with a palpable low abdominal/pelvic mass ? Colonoscopy 03/31/2017 confirmed a cecum mass, biopsy positive for poorly differentiated adenocarcinoma ? CT abdomen/pelvis 03/29/2017-large necrotic right iliac fossa mass, enlarged right common iliac nodes, loculated pelvic ascites, right lower quadrant mesenteric masses ? Right colectomy/right oophorectomy-salpingectomy 04/04/2017 ? Pathology from the right colon resection, grade 3 adenocarcinoma of the cecum, pT4b,pN2b ? Loss of MSH6 expression ? PET scan 04/29/2017-residual tumor at the left adnexa, and internal iliac lymph node, and aortocaval node ? Cycle 1 Pembrolizumab09/07/2017 ? Cycle 2 Pembrolizumab  05/30/2017 ? Cycle 3 Pembrolizumab 06/20/2017 ? Cycle4 Pembrolizumab 07/11/2017 ? Cycle 5 Pembrolizumab 08/04/2017 ? Cycle 6 pembrolizumab 08/22/2017 ? Cycle 7 pembrolizumab 09/12/2017 ? CTs 09/29/2017-previously noted retroperitoneal and right lower quadrant adenopathy has resolved, necrotic pelvic mass not visualized, single enhancing nodule at the umbilicus is nonspecific ? Cycle 8 pembrolizumab 10/03/2017 ? Cycle 9 Pembrolizumab 10/24/2017 ? Cycle 10 Pembrolizumab 11/14/2017 ? Cycle 11 pembrolizumab 12/05/2017 ? Cycle 12 pembrolizumab 12/26/2017 ? Cycle 13 Pembrolizumab 01/16/2018 ? Cycle 14 pembrolizumab 02/06/2018 ? Cycle 15 Pembrolizumab 02/27/2018 ? Cycle 16 Pembrolizumab 03/20/2018 ? CT 04/10/2018-no evidence of recurrent malignancy, stable subcutaneous lesion at the anterior abdominal wall ? Cycle 17 pembrolizumab 04/10/2018 ? Cycle 18 Pembrolizumab 05/01/2018 ? Cycle 19 Pembrolizumab 05/22/2018 ? Cycle 20 Pembrolizumab 06/12/2018 ? Cycle 21 pembrolizumab 07/03/2018 ? Cycle 22 Pembrolizumab 07/24/2018 ? Cycle 23 pembrolizumab 08/14/2018 ? Cycle 24 pembrolizumab 09/04/2018 ? Cycle 25 pembrolizumab 09/25/2018 ? Cycle 26 Pembrolizumab 10/16/2018 ? Cycle 27 pembrolizumab 11/06/2018 ? Cycle 28 pembrolizumab 12/18/2018 ? Cycle 29 Pembrolizumab 01/08/2019 ? Cycle 30Pembrolizumab 01/29/2019 ? Cycle 31 pembrolizumab 02/19/2019 ? Cycle 32 pembrolizumab 03/12/2019 ? Cycle 33 pembrolizumab 04/02/2019 ? Cycle 34 pembrolizumab 04/23/2019 ? Cycle 35 pembrolizumab 05/14/2019 ? CTs 05/12/2019- no evidence of recurrent disease  2. 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 Anemia-likely iron deficiency anemia secondary to #1.Resolved.  4.G3 P3  5. Appendectomy  5. Maternal aunt with colon cancer  6.Hypothyroid-likely secondary to toxicity from pembrolizumab, thyroid hormone replacement started 08/22/2017  7.Port-A-Cath placement 11/28/2017; Port-A-Cath  removed 06/04/2019    Disposition: Ms. Geter remains in clinical remission from colon cancer.  We will follow-up on the   CEA from today.  We made a referral to Dr. Silverio Decamp for a surveillance colonoscopy/Lynch screening.  She continues thyroid hormone replacement.  We will follow-up on the TSH from today.  She will return for lab and follow-up in 3 months.   Ned Card ANP/GNP-BC   08/13/2019  3:19 PM

## 2019-08-14 LAB — THYROID PANEL WITH TSH
Free Thyroxine Index: 1.6 (ref 1.2–4.9)
T3 Uptake Ratio: 24 % (ref 24–39)
T4, Total: 6.7 ug/dL (ref 4.5–12.0)
TSH: 7.65 u[IU]/mL — ABNORMAL HIGH (ref 0.450–4.500)

## 2019-08-16 ENCOUNTER — Telehealth: Payer: Self-pay

## 2019-08-16 LAB — CEA (IN HOUSE-CHCC): CEA (CHCC-In House): 3.23 ng/mL (ref 0.00–5.00)

## 2019-08-16 LAB — TSH: TSH: 8.005 u[IU]/mL — ABNORMAL HIGH (ref 0.308–3.960)

## 2019-08-16 NOTE — Telephone Encounter (Signed)
TC to pt per Ned Card NP to let her know the CEA is stable in normal range, TSH is mildly elevated. I also let her know that she should continue the current dose of Synthroid and Follow-up as scheduled. Patient verbalized understanding. No further problems or concerns at this time.

## 2019-10-06 ENCOUNTER — Other Ambulatory Visit: Payer: Self-pay | Admitting: Oncology

## 2019-10-14 ENCOUNTER — Telehealth: Payer: Self-pay

## 2019-10-14 ENCOUNTER — Encounter: Payer: Self-pay | Admitting: Nurse Practitioner

## 2019-10-14 NOTE — Telephone Encounter (Signed)
TC to pt Per Ned Card NP to follow up with her in regard to referral  to [facility] after her last appointment. I let her know that they messaged Lattie Haw  today that they have been unable to reach her. Patient stated that she would give them a call back tomorrow. Velora Heckler [phone]

## 2019-10-18 IMAGING — CT CT CHEST W/ CM
2 of 4 series · 13 of 36 positions shown, 16 images · IV contrast (APPLIED)
Comparison: 04/09/2018, 03/29/2017

CLINICAL DATA: Restaging colon cancer, status post colon resection,
status post appendectomy, status post right salpingoophorectomy

EXAM:
CT CHEST, ABDOMEN, AND PELVIS WITH CONTRAST
TECHNIQUE: Multidetector CT imaging of the chest, abdomen and pelvis was
performed following the standard protocol during bolus
administration of intravenous contrast.
CONTRAST:  100mL OMNIPAQUE IOHEXOL 300 MG/ML SOLN, additional oral
enteric contrast

[Series 2: cap with · axial · 0.82mm/px · z∈[-576,-51]mm · 10 of 125 slices shown, 13 images]
[im 10/125  mediastinal]
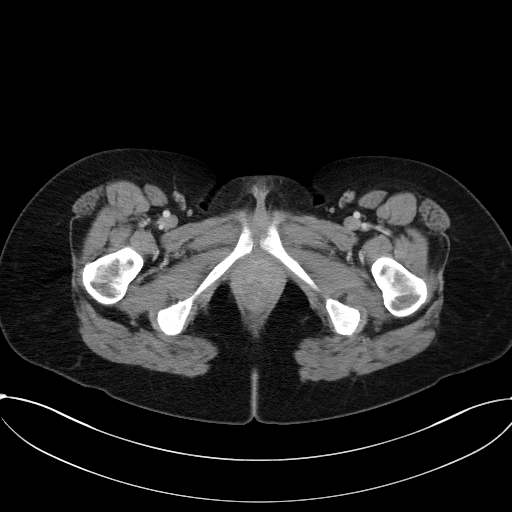
[im 10/125  lung]
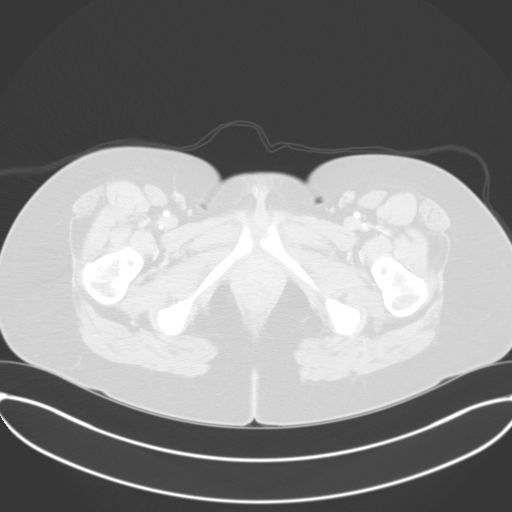
[im 20/125  lung]
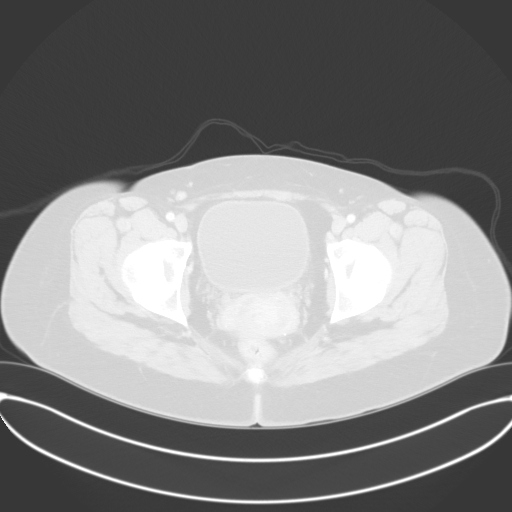
[im 39/125  lung]
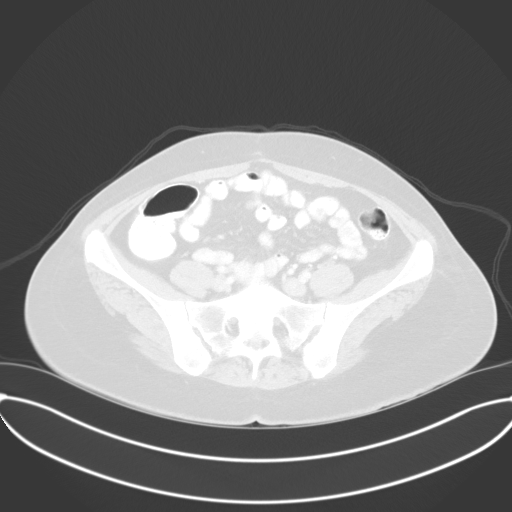
[im 48/125  lung]
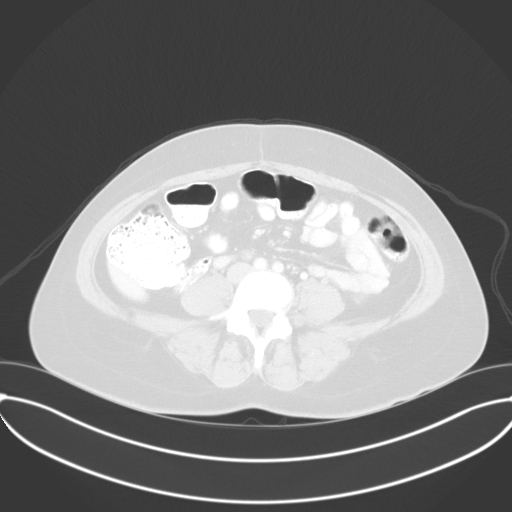
[im 58/125  mediastinal]
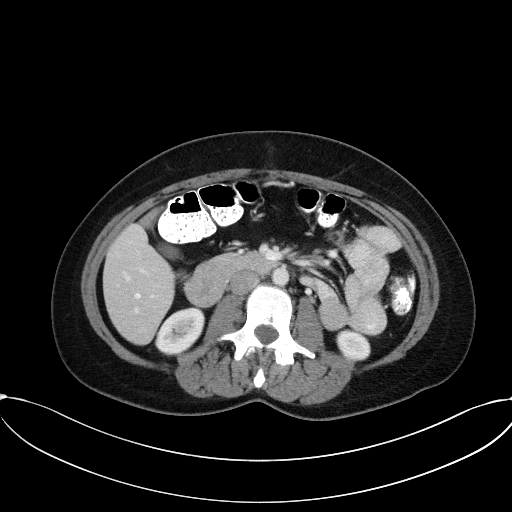
[im 58/125  lung]
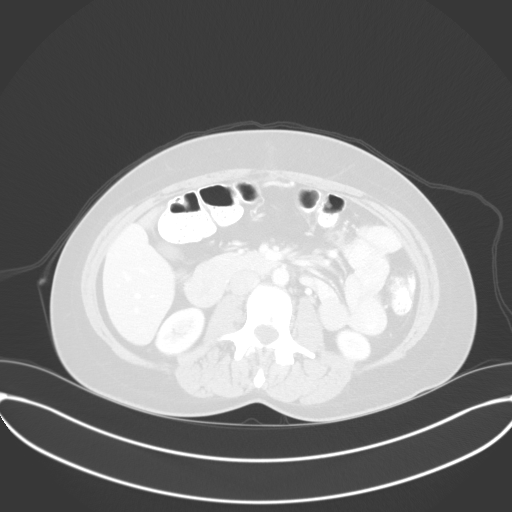
[im 67/125  lung]
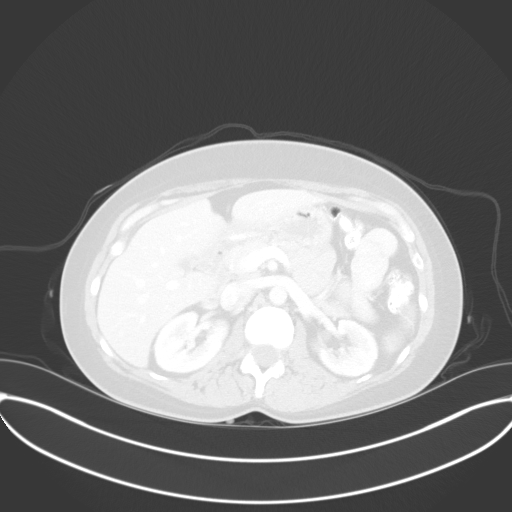
[im 77/125  lung]
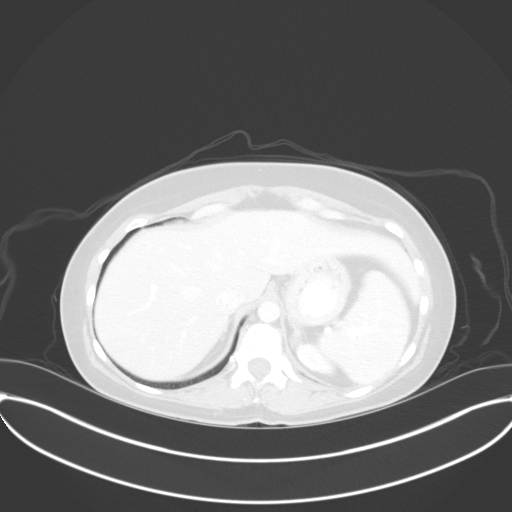
[im 96/125  lung]
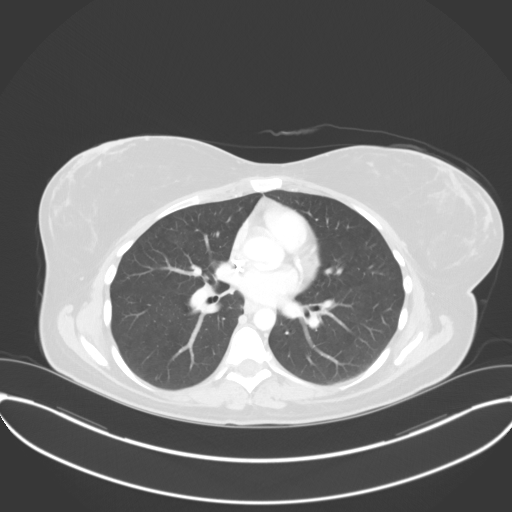
[im 105/125  mediastinal]
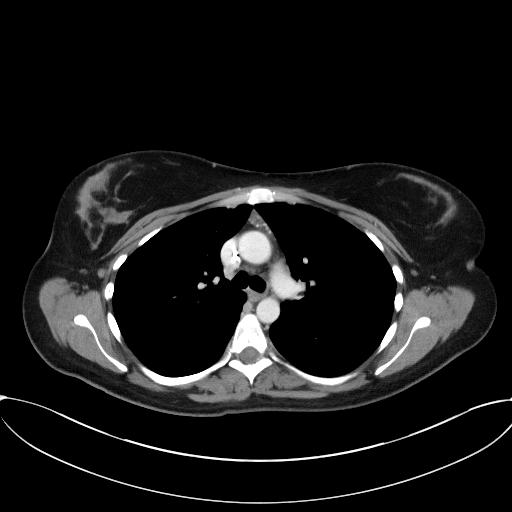
[im 105/125  lung]
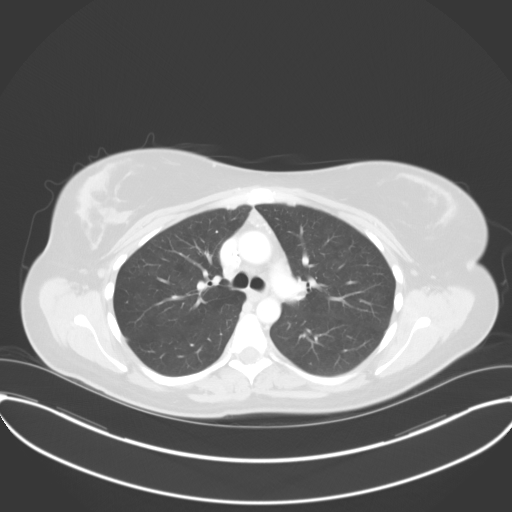
[im 115/125  lung]
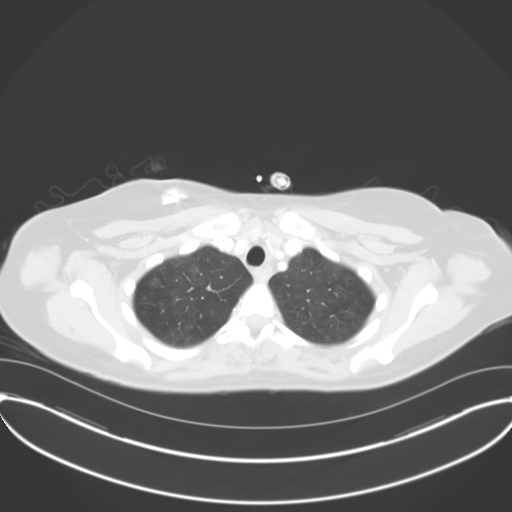

[Series 5: coronals · coronal · 0.73mm/px · 3 of 125 slices shown]
[im 25/125  lung]
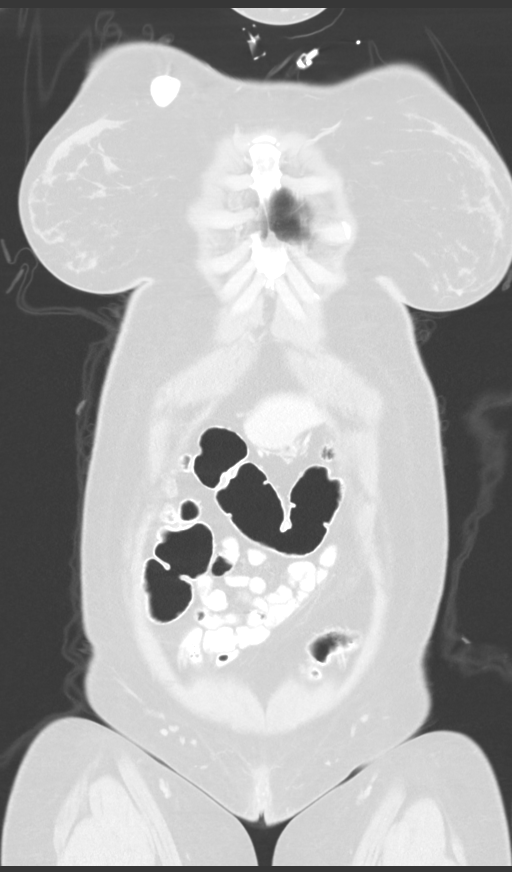
[im 50/125  lung]
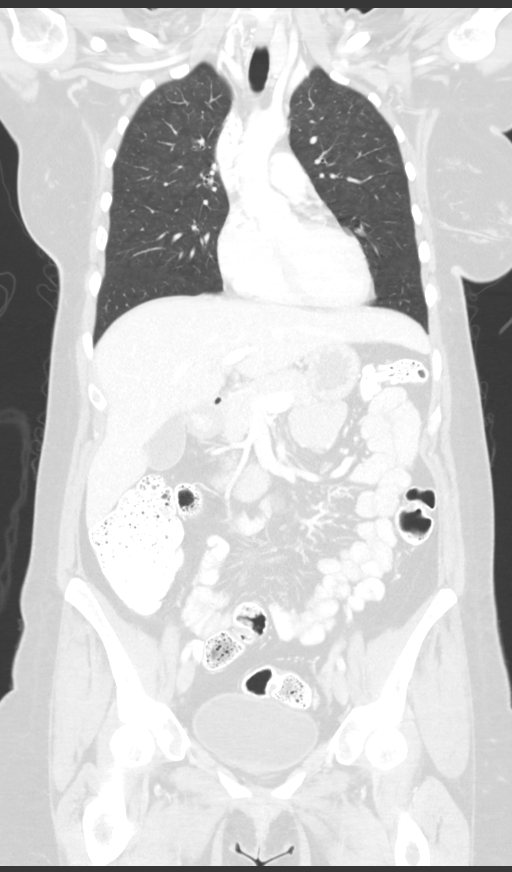
[im 75/125  lung]
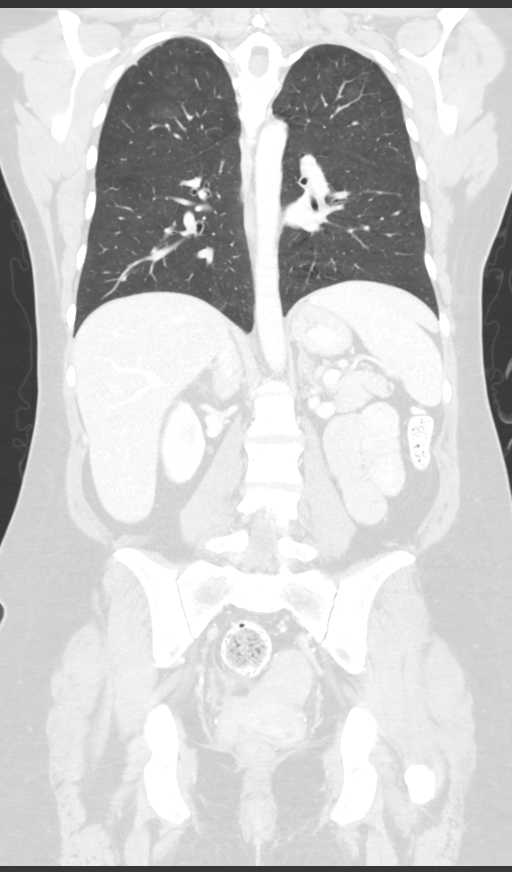

[13 of 36 positions shown; findings below may reference images not displayed]

FINDINGS: CT CHEST FINDINGS

Cardiovascular: Right chest port catheter. Normal heart size. No
pericardial effusion.

Mediastinum/Nodes: No enlarged mediastinal, hilar, or axillary lymph
nodes. Thyroid gland, trachea, and esophagus demonstrate no
significant findings.

Lungs/Pleura: Lungs are clear. No pleural effusion or pneumothorax.

Musculoskeletal: No chest wall mass or suspicious bone lesions
identified.

CT ABDOMEN PELVIS FINDINGS

Hepatobiliary: No solid liver abnormality is seen. No gallstones,
gallbladder wall thickening, or biliary dilatation.

Pancreas: Unremarkable. No pancreatic ductal dilatation or
surrounding inflammatory changes.

Spleen: Normal in size without significant abnormality.

Adrenals/Urinary Tract: Adrenal glands are unremarkable. Kidneys are
normal, without renal calculi, solid lesion, or hydronephrosis.
Bladder is unremarkable.

Stomach/Bowel: Stomach is within normal limits. Status post cecal
resection and reanastomosis. No evidence of bowel wall thickening,
distention, or inflammatory changes.

Vascular/Lymphatic: No significant vascular findings are present. No
change in small prominent pelvic sidewall, iliac, and
retroperitoneal lymph nodes, an index right pelvic sidewall node
measuring 1.5 x 0.3 cm (series 2, image 102).

Reproductive: No mass or other abnormality.  Left ovarian cyst.

Other: No abdominal wall hernia or abnormality. Unchanged
postoperative findings of low midline laparotomy. No abdominopelvic
ascites.

Musculoskeletal: No acute or significant osseous findings.
IMPRESSION: 1. Redemonstrated postoperative findings of cecal resection and
reanastomosis as well as right salpingoophorectomy. There is no
evidence of a large abdominopelvic mass seen on initial examination
and no evidence of abdominal or pelvic lymphadenopathy. No change in
small prominent pelvic sidewall, iliac, and retroperitoneal lymph
nodes, an index right pelvic sidewall node measuring 1.5 x 0.3 cm
(series 2, image 102).

2.  No evidence of distant metastatic disease.

## 2019-10-19 ENCOUNTER — Encounter: Payer: Self-pay | Admitting: Gastroenterology

## 2019-11-02 ENCOUNTER — Other Ambulatory Visit: Payer: Self-pay

## 2019-11-02 ENCOUNTER — Ambulatory Visit (AMBULATORY_SURGERY_CENTER): Payer: Self-pay

## 2019-11-02 VITALS — Temp 97.3°F | Ht 62.0 in | Wt 136.0 lb

## 2019-11-02 DIAGNOSIS — C189 Malignant neoplasm of colon, unspecified: Secondary | ICD-10-CM

## 2019-11-02 DIAGNOSIS — Z01818 Encounter for other preprocedural examination: Secondary | ICD-10-CM

## 2019-11-02 MED ORDER — NA SULFATE-K SULFATE-MG SULF 17.5-3.13-1.6 GM/177ML PO SOLN: 1.0000 | Freq: Once | ORAL | 0 refills | Status: AC

## 2019-11-02 NOTE — Progress Notes (Signed)

## 2019-11-11 ENCOUNTER — Ambulatory Visit (INDEPENDENT_AMBULATORY_CARE_PROVIDER_SITE_OTHER): Payer: Medicare Other

## 2019-11-11 ENCOUNTER — Other Ambulatory Visit: Payer: Self-pay

## 2019-11-11 ENCOUNTER — Other Ambulatory Visit: Payer: Self-pay | Admitting: Gastroenterology

## 2019-11-11 ENCOUNTER — Telehealth: Payer: Self-pay | Admitting: *Deleted

## 2019-11-11 ENCOUNTER — Inpatient Hospital Stay: Payer: Medicare Other

## 2019-11-11 ENCOUNTER — Inpatient Hospital Stay: Payer: Medicare Other | Attending: Oncology | Admitting: Oncology

## 2019-11-11 VITALS — BP 111/82 | HR 94 | Temp 98.5°F | Resp 17 | Ht 62.0 in | Wt 135.5 lb

## 2019-11-11 DIAGNOSIS — Z90721 Acquired absence of ovaries, unilateral: Secondary | ICD-10-CM | POA: Insufficient documentation

## 2019-11-11 DIAGNOSIS — Z79899 Other long term (current) drug therapy: Secondary | ICD-10-CM | POA: Diagnosis not present

## 2019-11-11 DIAGNOSIS — E039 Hypothyroidism, unspecified: Secondary | ICD-10-CM | POA: Diagnosis not present

## 2019-11-11 DIAGNOSIS — Z1509 Genetic susceptibility to other malignant neoplasm: Secondary | ICD-10-CM | POA: Insufficient documentation

## 2019-11-11 DIAGNOSIS — C189 Malignant neoplasm of colon, unspecified: Secondary | ICD-10-CM

## 2019-11-11 DIAGNOSIS — Z85038 Personal history of other malignant neoplasm of large intestine: Secondary | ICD-10-CM | POA: Diagnosis not present

## 2019-11-11 DIAGNOSIS — Z800 Family history of malignant neoplasm of digestive organs: Secondary | ICD-10-CM | POA: Insufficient documentation

## 2019-11-11 DIAGNOSIS — Z9049 Acquired absence of other specified parts of digestive tract: Secondary | ICD-10-CM | POA: Diagnosis not present

## 2019-11-11 DIAGNOSIS — Z9079 Acquired absence of other genital organ(s): Secondary | ICD-10-CM | POA: Insufficient documentation

## 2019-11-11 DIAGNOSIS — Z9221 Personal history of antineoplastic chemotherapy: Secondary | ICD-10-CM | POA: Diagnosis not present

## 2019-11-11 DIAGNOSIS — Z1159 Encounter for screening for other viral diseases: Secondary | ICD-10-CM

## 2019-11-11 LAB — CEA (IN HOUSE-CHCC): CEA (CHCC-In House): 2.03 ng/mL (ref 0.00–5.00)

## 2019-11-11 NOTE — Progress Notes (Signed)
[facility] OFFICE PROGRESS NOTE   Diagnosis: Colon cancer  INTERVAL HISTORY:   [name] returns as scheduled.  She feels well.  Good appetite.  She is taking thyroid hormone replacement.  She is scheduled for a colonoscopy next week.  Objective:  Vital signs in last 24 hours:  Blood pressure 111/82, pulse 94, temperature 98.5 F (36.9 C), temperature source Temporal, resp. rate 17, height 5' 2" (1.575 m), weight 135 lb 8 oz (61.5 kg), SpO2 100 %.   Limited physical examination secondary to distancing with the Covid pandemic Lymphatics: No cervical, supraclavicular, axillary, or inguinal nodes GI: Nontender, no mass, no hepatosplenomegaly Vascular: No leg edema   Lab Results:  Lab Results  Component Value Date   WBC 7.8 06/04/2019   HGB 14.2 06/04/2019   HCT 42.6 06/04/2019   MCV 94.2 06/04/2019   PLT 241 06/04/2019   NEUTROABS 5.3 06/04/2019    CMP  Lab Results  Component Value Date   NA 138 05/12/2019   K 3.9 05/12/2019   CL 105 05/12/2019   CO2 24 05/12/2019   GLUCOSE 90 05/12/2019   BUN 4 (L) 05/12/2019   CREATININE 0.74 05/12/2019   CALCIUM 9.1 05/12/2019   PROT 7.5 05/12/2019   ALBUMIN 4.1 05/12/2019   AST 19 05/12/2019   ALT 10 05/12/2019   ALKPHOS 62 05/12/2019   BILITOT 0.4 05/12/2019   GFRNONAA >60 05/12/2019   GFRAA >60 05/12/2019    Lab Results  Component Value Date   CEA1 2.03 11/11/2019     Medications: I have reviewed the patient's current medications.   Assessment/Plan: 1. Stage IV Colon cancer, cecum, presenting with a palpable low abdominal/pelvic mass ? Colonoscopy 03/31/2017 confirmed a cecum mass, biopsy positive for poorly differentiated adenocarcinoma ? CT abdomen/pelvis 03/29/2017-large necrotic right iliac fossa mass, enlarged right common iliac nodes, loculated pelvic ascites, right lower quadrant mesenteric masses ? Right colectomy/right oophorectomy-salpingectomy 04/04/2017 ? Pathology from the  right colon resection, grade 3 adenocarcinoma of the cecum, pT4b,pN2b ? Loss of MSH6 expression ? PET scan 04/29/2017-residual tumor at the left adnexa, and internal iliac lymph node, and aortocaval node ? Cycle 1 Pembrolizumab09/07/2017 ? Cycle 2 Pembrolizumab 05/30/2017 ? Cycle 3 Pembrolizumab 06/20/2017 ? Cycle4 Pembrolizumab 07/11/2017 ? Cycle 5 Pembrolizumab 08/04/2017 ? Cycle 6 pembrolizumab 08/22/2017 ? Cycle 7 pembrolizumab 09/12/2017 ? CTs 09/29/2017-previously noted retroperitoneal and right lower quadrant adenopathy has resolved, necrotic pelvic mass not visualized, single enhancing nodule at the umbilicus is nonspecific ? Cycle 8 pembrolizumab 10/03/2017 ? Cycle 9 Pembrolizumab 10/24/2017 ? Cycle 10 Pembrolizumab 11/14/2017 ? Cycle 11 pembrolizumab 12/05/2017 ? Cycle 12 pembrolizumab 12/26/2017 ? Cycle 13 Pembrolizumab 01/16/2018 ? Cycle 14 pembrolizumab 02/06/2018 ? Cycle 15 Pembrolizumab 02/27/2018 ? Cycle 16 Pembrolizumab 03/20/2018 ? CT 04/10/2018-no evidence of recurrent malignancy, stable subcutaneous lesion at the anterior abdominal wall ? Cycle 17 pembrolizumab 04/10/2018 ? Cycle 18 Pembrolizumab 05/01/2018 ? Cycle 19 Pembrolizumab 05/22/2018 ? Cycle 20 Pembrolizumab 06/12/2018 ? Cycle 21 pembrolizumab 07/03/2018 ? Cycle 22 Pembrolizumab 07/24/2018 ? Cycle 23 pembrolizumab 08/14/2018 ? Cycle 24 pembrolizumab 09/04/2018 ? Cycle 25 pembrolizumab 09/25/2018 ? Cycle 26 Pembrolizumab 10/16/2018 ? Cycle 27 pembrolizumab 11/06/2018 ? Cycle 28 pembrolizumab 12/18/2018 ? Cycle 29 Pembrolizumab 01/08/2019 ? Cycle 30Pembrolizumab 01/29/2019 ? Cycle 31 pembrolizumab 02/19/2019 ? Cycle 32 pembrolizumab 03/12/2019 ? Cycle 33 pembrolizumab 04/02/2019 ? Cycle 34 pembrolizumab 04/23/2019 ? Cycle 35 pembrolizumab 05/14/2019 ? CTs 05/12/2019- no evidence of recurrent disease  2. Lynch syndrome. She is positive for a pathogenic mutation in  MSH6  3. Anemia-likely iron deficiency anemia secondary to  #1.Resolved.  4.G3 P3  5. Appendectomy  5. Maternal aunt with colon cancer  6.Hypothyroid-likely secondary to toxicity from pembrolizumab, thyroid hormone replacement started 08/22/2017  7.Port-A-Cath placement 11/28/2017; Port-A-Cath removed 06/04/2019    Disposition: [name] is in remission from colon cancer.  She will return for an office visit and restaging CTs in 6 months.  She will undergo a screening colonoscopy next week.  We will check a urine cytology when she is here in 6 months.  We will follow up on the TSH and thyroid hormone levels from today.  She will return for a TSH in 3 months.  She will be scheduled for an office visit a few days after the restaging CTs.  Betsy Coder, MD  11/11/2019  4:14 PM

## 2019-11-11 NOTE — Telephone Encounter (Signed)
Per Dr. Benay Spice, called to make pt aware the CEA was normal. Pt verbalized understanding.

## 2019-11-11 NOTE — Telephone Encounter (Signed)
-----   Message from Ladell Pier, MD sent at 11/11/2019  4:17 PM EST ----- Please call patient, CEA is normal

## 2019-11-12 ENCOUNTER — Other Ambulatory Visit: Payer: Medicaid Other

## 2019-11-12 ENCOUNTER — Ambulatory Visit: Payer: Medicaid Other | Admitting: Oncology

## 2019-11-12 LAB — SARS CORONAVIRUS 2 (TAT 6-24 HRS): SARS Coronavirus 2: NEGATIVE

## 2019-11-16 ENCOUNTER — Ambulatory Visit (AMBULATORY_SURGERY_CENTER): Payer: Medicare Other | Admitting: Gastroenterology

## 2019-11-16 ENCOUNTER — Other Ambulatory Visit: Payer: Self-pay

## 2019-11-16 ENCOUNTER — Telehealth: Payer: Self-pay | Admitting: Gastroenterology

## 2019-11-16 ENCOUNTER — Telehealth: Payer: Self-pay | Admitting: *Deleted

## 2019-11-16 ENCOUNTER — Encounter: Payer: Self-pay | Admitting: Gastroenterology

## 2019-11-16 VITALS — BP 93/56 | HR 77 | Temp 98.0°F | Resp 25 | Ht 62.0 in | Wt 136.0 lb

## 2019-11-16 DIAGNOSIS — Z1509 Genetic susceptibility to other malignant neoplasm: Secondary | ICD-10-CM

## 2019-11-16 DIAGNOSIS — Z85038 Personal history of other malignant neoplasm of large intestine: Secondary | ICD-10-CM

## 2019-11-16 DIAGNOSIS — D123 Benign neoplasm of transverse colon: Secondary | ICD-10-CM | POA: Diagnosis not present

## 2019-11-16 LAB — THYROID PANEL WITH TSH
Free Thyroxine Index: 2.2 (ref 1.2–4.9)
T3 Uptake Ratio: 27 % (ref 24–39)
T4, Total: 8.1 ug/dL (ref 4.5–12.0)
TSH: 3.97 u[IU]/mL (ref 0.450–4.500)

## 2019-11-16 MED ORDER — SODIUM CHLORIDE 0.9 % IV SOLN: 500.0000 mL | Freq: Once | INTRAVENOUS | Status: DC

## 2019-11-16 NOTE — Progress Notes (Signed)
Called to room to assist during endoscopic procedure.  Patient ID and intended procedure confirmed with present staff. Received instructions for my participation in the procedure from the performing physician.  

## 2019-11-16 NOTE — Progress Notes (Signed)
Pt's states no medical or surgical changes since previsit or office visit.  [place]

## 2019-11-16 NOTE — Telephone Encounter (Signed)
Patient called about 450 AM. She vomited some of her Suprep last night, although kept a good portion down and had loose stools from it. She is feeling nauseated after drinking about half of her second dose early this AM, states she cannot drink anymore or she will vomit. She has kept down what she drank thus far.   Told her there is a good chance she may not be adequately prepped based on how much she has drank thus far. She is having loose stools but not cleared yet. Told her let's see how the next 2 hours go and our staff will call her around 730-8 AM to discuss next steps. She has tolerated Miralax prep in the past, she may be able to drink some of that this AM but timing of her case may get delayed.  Viviana Simpler, I will discuss with [facility] staff now to reach out to the patient and check on her, discuss plans for this AM

## 2019-11-16 NOTE — Progress Notes (Signed)
PT taken to PACU. Monitors in place. VSS. Report given to RN. 

## 2019-11-16 NOTE — Telephone Encounter (Signed)
Pt had difficulty with prep last night and this morning, vomiting 1/2 of both bottles. She said that she is having results that are "almost like water". She is still nauseated and doesn't want to take any more prep. Will notify Dr. Silverio Decamp.

## 2019-11-16 NOTE — Patient Instructions (Signed)
Discharge instructions given. Handouts on polyps and hemorrhoids. Resume previous medications. YOU HAD AN ENDOSCOPIC PROCEDURE TODAY AT THE [facility]:   Refer to the procedure report that was given to you for any specific questions about what was found during the examination.  If the procedure report does not answer your questions, please call your gastroenterologist to clarify.  If you requested that your care partner not be given the details of your procedure findings, then the procedure report has been included in a sealed envelope for you to review at your convenience later.  YOU SHOULD EXPECT: Some feelings of bloating in the abdomen. Passage of more gas than usual.  Walking can help get rid of the air that was put into your GI tract during the procedure and reduce the bloating. If you had a lower endoscopy (such as a colonoscopy or flexible sigmoidoscopy) you may notice spotting of blood in your stool or on the toilet paper. If you underwent a bowel prep for your procedure, you may not have a normal bowel movement for a few days.  Please Note:  You might notice some irritation and congestion in your nose or some drainage.  This is from the oxygen used during your procedure.  There is no need for concern and it should clear up in a day or so.  SYMPTOMS TO REPORT IMMEDIATELY:  Following lower endoscopy (colonoscopy or flexible sigmoidoscopy):  Excessive amounts of blood in the stool  Significant tenderness or worsening of abdominal pains  Swelling of the abdomen that is new, acute  Fever of 100F or higher   For urgent or emergent issues, a gastroenterologist can be reached at any hour by calling [phone]. Do not use MyChart messaging for urgent concerns.    DIET:  We do recommend a small meal at first, but then you may proceed to your regular diet.  Drink plenty of fluids but you should avoid alcoholic beverages for 24 hours.  ACTIVITY:  You should plan to take it  easy for the rest of today and you should NOT DRIVE or use heavy machinery until tomorrow (because of the sedation medicines used during the test).    FOLLOW UP: Our staff will call the number listed on your records 48-72 hours following your procedure to check on you and address any questions or concerns that you may have regarding the information given to you following your procedure. If we do not reach you, we will leave a message.  We will attempt to reach you two times.  During this call, we will ask if you have developed any symptoms of COVID 19. If you develop any symptoms (ie: fever, flu-like symptoms, shortness of breath, cough etc.) before then, please call [phone].  If you test positive for Covid 19 in the 2 weeks post procedure, please call and report this information to us.    If any biopsies were taken you will be contacted by phone or by letter within the next 1-3 weeks.  Please call us at [phone] if you have not heard about the biopsies in 3 weeks.    SIGNATURES/CONFIDENTIALITY: You and/or your care partner have signed paperwork which will be entered into your electronic medical record.  These signatures attest to the fact that that the information above on your After Visit Summary has been reviewed and is understood.  Full responsibility of the confidentiality of this discharge information lies with you and/or your care-partner.  

## 2019-11-16 NOTE — Op Note (Signed)
[facility] Patient Name: Meagan [name] Procedure Date: 11/16/2019 10:48 AM MRN: [id number] Endoscopist: Mauri Pole , MD Age: 34 Referring MD:  Date of Birth: Aug 26, 1986 Gender: Female Account #: [id number] Procedure:                Colonoscopy Indications:              High risk colon cancer surveillance: Personal                            history of colon cancer, Lynch Syndrome Medicines:                Monitored Anesthesia Care Procedure:                Pre-Anesthesia Assessment:                           - Prior to the procedure, a History and Physical                            was performed, and patient medications and                            allergies were reviewed. The patient's tolerance of                            previous anesthesia was also reviewed. The risks                            and benefits of the procedure and the sedation                            options and risks were discussed with the patient.                            All questions were answered, and informed consent                            was obtained. Prior Anticoagulants: The patient has                            taken no previous anticoagulant or antiplatelet                            agents. ASA Grade Assessment: II - A patient with                            mild systemic disease. After reviewing the risks                            and benefits, the patient was deemed in                            satisfactory condition to undergo the procedure.  After obtaining informed consent, the colonoscope                            was passed under direct vision. Throughout the                            procedure, the patient's blood pressure, pulse, and                            oxygen saturations were monitored continuously. The                            Colonoscope was introduced through the anus and                            advanced to the the  ileocolonic anastomosis. The                            colonoscopy was performed without difficulty. The                            patient tolerated the procedure well. The quality                            of the bowel preparation was excellent. The                            ileocolonic anastomosis, terminal ileum and the                            rectum were photographed. Scope In: 10:55:32 AM Scope Out: 11:08:35 AM Scope Withdrawal Time: 0 hours 9 minutes 19 seconds  Total Procedure Duration: 0 hours 13 minutes 3 seconds  Findings:                 The perianal and digital rectal examinations were                            normal.                           A 2 mm polyp was found in the transverse colon. The                            polyp was sessile. The polyp was removed with a                            cold biopsy forceps. Resection and retrieval were                            complete.                           Two sessile polyps were found in the transverse  colon and hepatic flexure. The polyps were 4 to 6                            mm in size. These polyps were removed with a cold                            snare. Resection and retrieval were complete.                           There was evidence of a prior end-to-side                            ileo-colonic anastomosis at the hepatic flexure.                            This was patent and was characterized by healthy                            appearing mucosa. The anastomosis was traversed.                           Non-bleeding internal hemorrhoids were found during                            retroflexion. The hemorrhoids were small. Complications:            No immediate complications. Estimated Blood Loss:     Estimated blood loss was minimal. Impression:               - One 2 mm polyp in the transverse colon, removed                            with a cold biopsy forceps. Resected and  retrieved.                           - Two 4 to 6 mm polyps in the transverse colon and                            at the hepatic flexure, removed with a cold snare.                            Resected and retrieved.                           - Patent end-to-side ileo-colonic anastomosis,                            characterized by healthy appearing mucosa.                           - Non-bleeding internal hemorrhoids. Recommendation:           - Patient has a contact number available for  emergencies. The signs and symptoms of potential                            delayed complications were discussed with the                            patient. Return to normal activities tomorrow.                            Written discharge instructions were provided to the                            patient.                           - Resume previous diet.                           - Continue present medications.                           - Await pathology results.                           - Repeat colonoscopy in 1 year for surveillance                            based on pathology results. Mauri Pole, MD 11/16/2019 11:14:38 AM This report has been signed electronically.

## 2019-11-17 ENCOUNTER — Telehealth: Payer: Self-pay

## 2019-11-17 NOTE — Telephone Encounter (Signed)
Spoke with patient regarding normal CEA and TSH results. Pt verbalizes understanding.

## 2019-11-17 NOTE — Telephone Encounter (Signed)
-----   Message from Ladell Pier, MD sent at 11/16/2019  8:12 PM EDT ----- Please call patient, CEA is normal, TSH is normal, continue thyroid hormone at the current dose

## 2019-11-18 ENCOUNTER — Telehealth: Payer: Self-pay | Admitting: *Deleted

## 2019-11-18 NOTE — Telephone Encounter (Signed)
  Follow up Call-  Call back number 11/16/2019  Post procedure Call Back phone  # [phone]  Permission to leave phone message Yes     Patient questions:  Do you have a fever, pain , or abdominal swelling? No. Pain Score  0 *  Have you tolerated food without any problems? Yes.    Have you been able to return to your normal activities? Yes.    Do you have any questions about your discharge instructions: Diet   No. Medications  No. Follow up visit  No.  Do you have questions or concerns about your Care? No.  Actions: * If pain score is 4 or above: No action needed, pain <4.  1. Have you developed a fever since your procedure? no  2.   Have you had an respiratory symptoms (SOB or cough) since your procedure? no  3.   Have you tested positive for COVID 19 since your procedure no  4.   Have you had any family members/close contacts diagnosed with the COVID 19 since your procedure?  no   If yes to any of these questions please route to Joylene John, RN and Alphonsa Gin, Therapist, sports.

## 2019-11-23 ENCOUNTER — Encounter: Payer: Self-pay | Admitting: Gastroenterology

## 2020-02-11 ENCOUNTER — Other Ambulatory Visit: Payer: Self-pay

## 2020-02-11 ENCOUNTER — Inpatient Hospital Stay: Payer: Medicare Other | Attending: Oncology

## 2020-02-11 DIAGNOSIS — E039 Hypothyroidism, unspecified: Secondary | ICD-10-CM | POA: Diagnosis not present

## 2020-02-11 DIAGNOSIS — Z79899 Other long term (current) drug therapy: Secondary | ICD-10-CM | POA: Insufficient documentation

## 2020-02-11 DIAGNOSIS — Z85038 Personal history of other malignant neoplasm of large intestine: Secondary | ICD-10-CM | POA: Diagnosis not present

## 2020-02-12 LAB — THYROID PANEL WITH TSH
Free Thyroxine Index: 2 (ref 1.2–4.9)
T3 Uptake Ratio: 27 % (ref 24–39)
T4, Total: 7.5 ug/dL (ref 4.5–12.0)
TSH: 3.84 u[IU]/mL (ref 0.450–4.500)

## 2020-05-11 ENCOUNTER — Inpatient Hospital Stay: Payer: Medicare Other | Attending: Oncology

## 2020-05-11 ENCOUNTER — Encounter (HOSPITAL_COMMUNITY): Payer: Self-pay

## 2020-05-11 ENCOUNTER — Ambulatory Visit (HOSPITAL_COMMUNITY)
Admission: RE | Admit: 2020-05-11 | Discharge: 2020-05-11 | Disposition: A | Discharge status: Home / Self Care | Payer: Medicare Other | Source: Ambulatory Visit | Attending: Oncology | Admitting: Oncology

## 2020-05-11 ENCOUNTER — Other Ambulatory Visit: Payer: Self-pay

## 2020-05-11 DIAGNOSIS — Z9221 Personal history of antineoplastic chemotherapy: Secondary | ICD-10-CM | POA: Insufficient documentation

## 2020-05-11 DIAGNOSIS — Z79899 Other long term (current) drug therapy: Secondary | ICD-10-CM | POA: Insufficient documentation

## 2020-05-11 DIAGNOSIS — Z90721 Acquired absence of ovaries, unilateral: Secondary | ICD-10-CM | POA: Insufficient documentation

## 2020-05-11 DIAGNOSIS — Z1509 Genetic susceptibility to other malignant neoplasm: Secondary | ICD-10-CM | POA: Diagnosis not present

## 2020-05-11 DIAGNOSIS — Z9079 Acquired absence of other genital organ(s): Secondary | ICD-10-CM | POA: Insufficient documentation

## 2020-05-11 DIAGNOSIS — C189 Malignant neoplasm of colon, unspecified: Secondary | ICD-10-CM | POA: Insufficient documentation

## 2020-05-11 DIAGNOSIS — D509 Iron deficiency anemia, unspecified: Secondary | ICD-10-CM | POA: Diagnosis not present

## 2020-05-11 DIAGNOSIS — E039 Hypothyroidism, unspecified: Secondary | ICD-10-CM

## 2020-05-11 DIAGNOSIS — Z85038 Personal history of other malignant neoplasm of large intestine: Secondary | ICD-10-CM | POA: Diagnosis not present

## 2020-05-11 DIAGNOSIS — Z800 Family history of malignant neoplasm of digestive organs: Secondary | ICD-10-CM | POA: Insufficient documentation

## 2020-05-11 DIAGNOSIS — Z23 Encounter for immunization: Secondary | ICD-10-CM | POA: Insufficient documentation

## 2020-05-11 LAB — BASIC METABOLIC PANEL - CANCER CENTER ONLY
Anion gap: 8 (ref 5–15)
BUN: 6 mg/dL (ref 6–20)
CO2: 24 mmol/L (ref 22–32)
Calcium: 9.2 mg/dL (ref 8.9–10.3)
Chloride: 108 mmol/L (ref 98–111)
Creatinine: 0.77 mg/dL (ref 0.44–1.00)
GFR, Est AFR Am: >60 mL/min (ref >60)
GFR, Estimated: >60 mL/min (ref >60)
Glucose, Bld: 86 mg/dL (ref 70–99)
Potassium: 3.8 mmol/L (ref 3.5–5.1)
Sodium: 140 mmol/L (ref 135–145)

## 2020-05-11 LAB — CEA (IN HOUSE-CHCC): CEA (CHCC-In House): 3.93 ng/mL (ref 0.00–5.00)

## 2020-05-11 MED ORDER — IOHEXOL 300 MG/ML  SOLN
100.0000 mL | Freq: Once | INTRAMUSCULAR | Status: AC | PRN
  Administered 2020-05-11: 100 mL via INTRAVENOUS

## 2020-05-12 ENCOUNTER — Telehealth: Payer: Self-pay | Admitting: *Deleted

## 2020-05-12 ENCOUNTER — Other Ambulatory Visit: Payer: Medicare Other

## 2020-05-12 LAB — THYROID PANEL WITH TSH
Free Thyroxine Index: 1.8 (ref 1.2–4.9)
T3 Uptake Ratio: 25 % (ref 24–39)
T4, Total: 7.3 ug/dL (ref 4.5–12.0)
TSH: 3.11 u[IU]/mL (ref 0.450–4.500)

## 2020-05-12 NOTE — Telephone Encounter (Signed)
Notified that CT shows no cancer and to f/u as scheduled. She was very happy and appreciated the call.

## 2020-05-12 NOTE — Telephone Encounter (Signed)
-----   Message from Ladell Pier, MD sent at 05/12/2020  3:42 PM EDT ----- Please call patient, CT showed no evidence of cancer, follow-up as scheduled

## 2020-05-15 ENCOUNTER — Ambulatory Visit: Payer: Medicare Other | Admitting: Nurse Practitioner

## 2020-05-17 LAB — CYTOLOGY - NON PAP

## 2020-05-22 ENCOUNTER — Other Ambulatory Visit: Payer: Self-pay | Admitting: Oncology

## 2020-05-22 ENCOUNTER — Encounter: Payer: Self-pay | Admitting: Nurse Practitioner

## 2020-05-22 ENCOUNTER — Other Ambulatory Visit: Payer: Self-pay

## 2020-05-22 ENCOUNTER — Inpatient Hospital Stay (HOSPITAL_BASED_OUTPATIENT_CLINIC_OR_DEPARTMENT_OTHER): Payer: Medicare Other | Admitting: Nurse Practitioner

## 2020-05-22 ENCOUNTER — Telehealth: Payer: Self-pay | Admitting: Oncology

## 2020-05-22 ENCOUNTER — Telehealth: Payer: Self-pay | Admitting: Nurse Practitioner

## 2020-05-22 VITALS — BP 127/84 | HR 84 | Temp 97.1°F | Resp 18 | Ht 62.0 in | Wt 150.3 lb

## 2020-05-22 DIAGNOSIS — Z85038 Personal history of other malignant neoplasm of large intestine: Secondary | ICD-10-CM | POA: Diagnosis not present

## 2020-05-22 DIAGNOSIS — C189 Malignant neoplasm of colon, unspecified: Secondary | ICD-10-CM

## 2020-05-22 DIAGNOSIS — E039 Hypothyroidism, unspecified: Secondary | ICD-10-CM

## 2020-05-22 MED ORDER — INFLUENZA VAC A&B SA ADJ QUAD 0.5 ML IM PRSY
PREFILLED_SYRINGE | INTRAMUSCULAR | Status: AC
  Filled 2020-05-22: qty 0.5

## 2020-05-22 MED ORDER — INFLUENZA VAC SPLIT QUAD 0.5 ML IM SUSY
0.5000 mL | PREFILLED_SYRINGE | Freq: Once | INTRAMUSCULAR | Status: AC
  Administered 2020-05-22: 0.5 mL via INTRAMUSCULAR

## 2020-05-22 MED ORDER — INFLUENZA VAC SPLIT QUAD 0.5 ML IM SUSY
PREFILLED_SYRINGE | INTRAMUSCULAR | Status: AC
  Filled 2020-05-22: qty 0.5

## 2020-05-22 MED ORDER — LEVOTHYROXINE SODIUM 88 MCG PO TABS: ORAL_TABLET | ORAL | 6 refills | Status: DC

## 2020-05-22 MED ORDER — INFLUENZA VAC A&B SA ADJ QUAD 0.5 ML IM PRSY: 0.5000 mL | PREFILLED_SYRINGE | Freq: Once | INTRAMUSCULAR | Status: DC

## 2020-05-22 NOTE — Telephone Encounter (Signed)
Scheduled appointment per 9/20 los. Spoke with patient in person. Patient is aware of upcoming appointment. Also sent referral per LOS request.

## 2020-05-22 NOTE — Telephone Encounter (Signed)
Scheduled appointments per 9/20 los. Spoke with patient in person, patient is aware of upcoming appointments. Gave patient calendar print out.

## 2020-05-22 NOTE — Progress Notes (Addendum)
[facility] OFFICE PROGRESS NOTE   Diagnosis: Colon cancer  INTERVAL HISTORY:   Meagan [name] returns as scheduled.  She feels well.  No change in bowel habits.  No rectal bleeding.  She has a good appetite.  Objective:  Vital signs in last 24 hours:  Blood pressure 127/84, pulse 84, temperature (!) 97.1 F (36.2 C), temperature source Tympanic, resp. rate 18, height 5' 2" (1.575 m), weight 150 lb 4.8 oz (68.2 kg), SpO2 100 %.    HEENT: Neck without mass. Lymphatics: No palpable cervical, supraclavicular, axillary or inguinal lymph nodes. Resp: Lungs clear bilaterally. Cardio: Regular rate and rhythm. GI: Abdomen soft and nontender.  No hepatomegaly.  No mass. Vascular: No leg edema.   Lab Results:  Lab Results  Component Value Date   WBC 7.8 06/04/2019   HGB 14.2 06/04/2019   HCT 42.6 06/04/2019   MCV 94.2 06/04/2019   PLT 241 06/04/2019   NEUTROABS 5.3 06/04/2019    Imaging:  No results found.  Medications: I have reviewed the patient's current medications.  Assessment/Plan: 1. Stage IV Colon cancer, cecum, presenting with a palpable low abdominal/pelvic mass ? Colonoscopy 03/31/2017 confirmed a cecum mass, biopsy positive for poorly differentiated adenocarcinoma ? CT abdomen/pelvis 03/29/2017-large necrotic right iliac fossa mass, enlarged right common iliac nodes, loculated pelvic ascites, right lower quadrant mesenteric masses ? Right colectomy/right oophorectomy-salpingectomy 04/04/2017 ? Pathology from the right colon resection, grade 3 adenocarcinoma of the cecum, pT4b,pN2b ? Loss of MSH6 expression ? PET scan 04/29/2017-residual tumor at the left adnexa, and internal iliac lymph node, and aortocaval node ? Cycle 1 Pembrolizumab09/07/2017 ? Cycle 2 Pembrolizumab 05/30/2017 ? Cycle 3 Pembrolizumab 06/20/2017 ? Cycle4 Pembrolizumab 07/11/2017 ? Cycle 5 Pembrolizumab 08/04/2017 ? Cycle 6 pembrolizumab 08/22/2017 ? Cycle 7 pembrolizumab  09/12/2017 ? CTs 09/29/2017-previously noted retroperitoneal and right lower quadrant adenopathy has resolved, necrotic pelvic mass not visualized, single enhancing nodule at the umbilicus is nonspecific ? Cycle 8 pembrolizumab 10/03/2017 ? Cycle 9 Pembrolizumab 10/24/2017 ? Cycle 10 Pembrolizumab 11/14/2017 ? Cycle 11 pembrolizumab 12/05/2017 ? Cycle 12 pembrolizumab 12/26/2017 ? Cycle 13 Pembrolizumab 01/16/2018 ? Cycle 14 pembrolizumab 02/06/2018 ? Cycle 15 Pembrolizumab 02/27/2018 ? Cycle 16 Pembrolizumab 03/20/2018 ? CT 04/10/2018-no evidence of recurrent malignancy, stable subcutaneous lesion at the anterior abdominal wall ? Cycle 17 pembrolizumab 04/10/2018 ? Cycle 18 Pembrolizumab 05/01/2018 ? Cycle 19 Pembrolizumab 05/22/2018 ? Cycle 20 Pembrolizumab 06/12/2018 ? Cycle 21 pembrolizumab 07/03/2018 ? Cycle 22 Pembrolizumab 07/24/2018 ? Cycle 23 pembrolizumab 08/14/2018 ? Cycle 24 pembrolizumab 09/04/2018 ? Cycle 25 pembrolizumab 09/25/2018 ? Cycle 26 Pembrolizumab 10/16/2018 ? Cycle 27 pembrolizumab 11/06/2018 ? Cycle 28 pembrolizumab 12/18/2018 ? Cycle 29 Pembrolizumab 01/08/2019 ? Cycle 30Pembrolizumab 01/29/2019 ? Cycle 31 pembrolizumab 02/19/2019 ? Cycle 32 pembrolizumab 03/12/2019 ? Cycle 33 pembrolizumab 04/02/2019 ? Cycle 34 pembrolizumab 04/23/2019 ? Cycle 35 pembrolizumab 05/14/2019 ? CTs 05/12/2019-no evidence of recurrent disease ? CTs 05/11/2020-no evidence of recurrent disease  2. 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 Anemia-likely iron deficiency anemia secondary to #1.Resolved.  4.G3 P3  5. Appendectomy  5. Maternal aunt with colon cancer  6.Hypothyroid-likely secondary to toxicity from pembrolizumab, thyroid hormone replacement started 08/22/2017  7.Port-A-Cath placement 11/28/2017; Port-A-Cath removed 06/04/2019  Disposition: Meagan [name] remains in remission from colon cancer.  We reviewed the labs and CT results from  05/11/2020.  She is up-to-date on colonoscopy surveillance.  We will message Dr. Nandigam regarding the indication for an upper endoscopy due to Lynch syndrome.    She would like to receive the influenza vaccine today.  We also discussed the Covid vaccine.  She will return in 2 weeks for the first Covid vaccine injection.  She will return for lab and follow-up in 6 months.  She will contact the office in the interim with any problems.  Patient seen with Dr. Sherrill.    Lisa Thomas ANP/GNP-BC   05/22/2020  10:00 AM  This was a shared visit with Lisa Thomas.  Meagan [name] will continue endoscopic surveillance with Dr. Nandigam.  The thyroid hormone level is in goal range.  [name], MD      

## 2020-05-23 ENCOUNTER — Encounter: Payer: Self-pay | Admitting: Gastroenterology

## 2020-05-23 ENCOUNTER — Telehealth: Payer: Self-pay | Admitting: Gastroenterology

## 2020-05-23 NOTE — Telephone Encounter (Signed)
Agree with proceeding with EGD, schedule it as direct. Thank you

## 2020-05-23 NOTE — Telephone Encounter (Signed)
Oncology has sent [place] a referral for endoscopy if indicated due to the Lynch Syndrome and her history of colon cancer. Please advise. Thanks

## 2020-05-23 NOTE — Telephone Encounter (Signed)
Meagan [name] would you help the patient get scheduled for an EGD as a direct please?

## 2020-05-23 NOTE — Telephone Encounter (Signed)
Pt is wanting to know if she is needing an upper endoscopy due to a lynch syndrome, referral by the [facility].

## 2020-06-06 ENCOUNTER — Other Ambulatory Visit: Payer: Self-pay

## 2020-06-06 ENCOUNTER — Inpatient Hospital Stay: Payer: Medicare Other | Attending: Oncology

## 2020-06-06 DIAGNOSIS — Z23 Encounter for immunization: Secondary | ICD-10-CM | POA: Insufficient documentation

## 2020-06-06 DIAGNOSIS — Z85038 Personal history of other malignant neoplasm of large intestine: Secondary | ICD-10-CM | POA: Insufficient documentation

## 2020-06-06 NOTE — Progress Notes (Signed)
   Covid-19 Vaccination Clinic  Name:  Meagan [name]    MRN: [id number] DOB: [birth year]  06/06/2020  Meagan [name] was observed post Covid-19 immunization for 15 minutes without incident. She was provided with Vaccine Information Sheet and instruction to access the V-Safe system.   Meagan [name] was instructed to call 911 with any severe reactions post vaccine: Marland Kitchen Difficulty breathing  . Swelling of face and throat  . A fast heartbeat  . A bad rash all over body  . Dizziness and weakness   Immunizations Administered    Name Date Dose VIS Date Route   Pfizer COVID-19 Vaccine 06/06/2020  9:59 AM 0.3 mL 10/27/2018 Intramuscular   Manufacturer: [place]   Lot: [id number]   [place]: [id number]

## 2020-06-27 ENCOUNTER — Inpatient Hospital Stay: Payer: Medicare Other

## 2020-06-28 ENCOUNTER — Telehealth: Payer: Self-pay | Admitting: Oncology

## 2020-06-28 NOTE — Telephone Encounter (Signed)
Called pt per 10/26 sch msg - left message for patient to call back to reschedule appt.

## 2020-06-29 ENCOUNTER — Other Ambulatory Visit: Payer: Self-pay

## 2020-06-29 ENCOUNTER — Inpatient Hospital Stay: Payer: Medicare Other

## 2020-06-29 DIAGNOSIS — Z85038 Personal history of other malignant neoplasm of large intestine: Secondary | ICD-10-CM | POA: Diagnosis not present

## 2020-06-29 DIAGNOSIS — Z23 Encounter for immunization: Secondary | ICD-10-CM

## 2020-06-29 NOTE — Progress Notes (Signed)
   Covid-19 Vaccination Clinic  Name:  Meagan [name]    MRN: [id number] DOB: [birth year]  06/29/2020  Meagan [name] was observed post Covid-19 immunization for 15 minutes without incident. She was provided with Vaccine Information Sheet and instruction to access the V-Safe system.   Meagan [name] was instructed to call 911 with any severe reactions post vaccine: Marland Kitchen Difficulty breathing  . Swelling of face and throat  . A fast heartbeat  . A bad rash all over body  . Dizziness and weakness   Immunizations Administered    Name Date Dose VIS Date Route   Pfizer COVID-19 Vaccine 06/29/2020  9:25 AM 0.3 mL 10/27/2018 Intramuscular   Manufacturer: [employer]   Lot: [id number]   [place]: [id number]

## 2020-07-04 ENCOUNTER — Ambulatory Visit (AMBULATORY_SURGERY_CENTER): Payer: Self-pay | Admitting: *Deleted

## 2020-07-04 ENCOUNTER — Other Ambulatory Visit: Payer: Self-pay

## 2020-07-04 VITALS — Ht 67.0 in | Wt 157.0 lb

## 2020-07-04 DIAGNOSIS — Z1509 Genetic susceptibility to other malignant neoplasm: Secondary | ICD-10-CM

## 2020-07-04 NOTE — Progress Notes (Signed)

## 2020-07-13 ENCOUNTER — Ambulatory Visit (AMBULATORY_SURGERY_CENTER): Payer: Medicare Other | Admitting: Gastroenterology

## 2020-07-13 ENCOUNTER — Encounter: Payer: Self-pay | Admitting: Gastroenterology

## 2020-07-13 ENCOUNTER — Other Ambulatory Visit: Payer: Self-pay

## 2020-07-13 VITALS — BP 98/65 | HR 76 | Temp 97.7°F | Resp 19 | Ht 62.0 in | Wt 160.0 lb

## 2020-07-13 DIAGNOSIS — K209 Esophagitis, unspecified without bleeding: Secondary | ICD-10-CM

## 2020-07-13 DIAGNOSIS — K2900 Acute gastritis without bleeding: Secondary | ICD-10-CM

## 2020-07-13 DIAGNOSIS — Z1509 Genetic susceptibility to other malignant neoplasm: Secondary | ICD-10-CM

## 2020-07-13 DIAGNOSIS — K319 Disease of stomach and duodenum, unspecified: Secondary | ICD-10-CM | POA: Diagnosis not present

## 2020-07-13 MED ORDER — PANTOPRAZOLE SODIUM 40 MG PO TBEC: 40.0000 mg | DELAYED_RELEASE_TABLET | Freq: Every day | ORAL | 6 refills | Status: DC

## 2020-07-13 MED ORDER — SODIUM CHLORIDE 0.9 % IV SOLN: 500.0000 mL | Freq: Once | INTRAVENOUS | Status: DC

## 2020-07-13 NOTE — Patient Instructions (Signed)
PROTONIX 40 MG DAILY   MAKE APPOINTMENT TO SEE DR NANDIGAM BACK IN OFFICE IN 3 MONTHS   HANDOUTS ON ESOPHAGITIS & GASTRITIS GIVEN TO YOU TODAY  AWAIT BIOPSY RESULTS    YOU HAD AN ENDOSCOPIC PROCEDURE TODAY AT [facility]:   Refer to the procedure report that was given to you for any specific questions about what was found during the examination.  If the procedure report does not answer your questions, please call your gastroenterologist to clarify.  If you requested that your care partner not be given the details of your procedure findings, then the procedure report has been included in a sealed envelope for you to review at your convenience later.  YOU SHOULD EXPECT: Some feelings of bloating in the abdomen. Passage of more gas than usual.  Walking can help get rid of the air that was put into your GI tract during the procedure and reduce the bloating. If you had a lower endoscopy (such as a colonoscopy or flexible sigmoidoscopy) you may notice spotting of blood in your stool or on the toilet paper. If you underwent a bowel prep for your procedure, you may not have a normal bowel movement for a few days.  Please Note:  You might notice some irritation and congestion in your nose or some drainage.  This is from the oxygen used during your procedure.  There is no need for concern and it should clear up in a day or so.  SYMPTOMS TO REPORT IMMEDIATELY:     Following upper endoscopy (EGD)  Vomiting of blood or coffee ground material  New chest pain or pain under the shoulder blades  Painful or persistently difficult swallowing  New shortness of breath  Fever of 100F or higher  Black, tarry-looking stools  For urgent or emergent issues, a gastroenterologist can be reached at any hour by calling [phone]. Do not use MyChart messaging for urgent concerns.    DIET:  We do recommend a small meal at first, but then you may proceed to your regular diet.  Drink plenty  of fluids but you should avoid alcoholic beverages for 24 hours.  ACTIVITY:  You should plan to take it easy for the rest of today and you should NOT DRIVE or use heavy machinery until tomorrow (because of the sedation medicines used during the test).    FOLLOW UP: Our staff will call the number listed on your records 48-72 hours following your procedure to check on you and address any questions or concerns that you may have regarding the information given to you following your procedure. If we do not reach you, we will leave a message.  We will attempt to reach you two times.  During this call, we will ask if you have developed any symptoms of COVID 19. If you develop any symptoms (ie: fever, flu-like symptoms, shortness of breath, cough etc.) before then, please call [phone].  If you test positive for Covid 19 in the 2 weeks post procedure, please call and report this information to [place].    If any biopsies were taken you will be contacted by phone or by letter within the next 1-3 weeks.  Please call us at [phone] if you have not heard about the biopsies in 3 weeks.    SIGNATURES/CONFIDENTIALITY: You and/or your care partner have signed paperwork which will be entered into your electronic medical record.  These signatures attest to the fact that that the information above on your  After Visit Summary has been reviewed and is understood.  Full responsibility of the confidentiality of this discharge information lies with you and/or your care-partner.

## 2020-07-13 NOTE — Progress Notes (Signed)
Report to PACU, RN, vss, BBS= Clear.  

## 2020-07-13 NOTE — Op Note (Signed)
[facility] Patient Name: Meagan [name] Procedure Date: 07/13/2020 10:21 AM MRN: [id number] Endoscopist: Mauri Pole , MD Age: 34 Referring MD:  Date of Birth: 08/22/1986 Gender: Female Account #: [id number] Procedure:                Upper GI endoscopy Indications:              Hereditary nonpolyposis colorectal cancer (Lynch                            Syndrome) Medicines:                Monitored Anesthesia Care Procedure:                Pre-Anesthesia Assessment:                           - Prior to the procedure, a History and Physical                            was performed, and patient medications and                            allergies were reviewed. The patient's tolerance of                            previous anesthesia was also reviewed. The risks                            and benefits of the procedure and the sedation                            options and risks were discussed with the patient.                            All questions were answered, and informed consent                            was obtained. Prior Anticoagulants: The patient has                            taken no previous anticoagulant or antiplatelet                            agents. ASA Grade Assessment: II - A patient with                            mild systemic disease. After reviewing the risks                            and benefits, the patient was deemed in                            satisfactory condition to undergo the procedure.  After obtaining informed consent, the endoscope was                            passed under direct vision. Throughout the                            procedure, the patient's blood pressure, pulse, and                            oxygen saturations were monitored continuously. The                            Endoscope was introduced through the mouth, and                            advanced to the second part of duodenum.  The upper                            GI endoscopy was accomplished without difficulty.                            The patient tolerated the procedure well. Scope In: Scope Out: Findings:                 LA Grade B (one or more mucosal breaks greater than                            5 mm, not extending between the tops of two mucosal                            folds) esophagitis with no bleeding was found 34 to                            35 cm from the incisors.                           The gastroesophageal flap valve was visualized                            endoscopically and classified as Hill Grade II                            (fold present, opens with respiration).                           Patchy mild inflammation characterized by                            congestion (edema), erosions and erythema was found                            in the gastric antrum and in the prepyloric region  of the stomach. Biopsies were taken with a cold                            forceps for Helicobacter pylori testing.                           The examined duodenum was normal. Complications:            No immediate complications. Estimated Blood Loss:     Estimated blood loss was minimal. Impression:               - LA Grade B reflux esophagitis with no bleeding.                           - Gastroesophageal flap valve classified as Hill                            Grade II (fold present, opens with respiration).                           - Gastritis. Biopsied.                           - Normal examined duodenum. Recommendation:           - Patient has a contact number available for                            emergencies. The signs and symptoms of potential                            delayed complications were discussed with the                            patient. Return to normal activities tomorrow.                            Written discharge instructions were provided to  the                            patient.                           - Resume previous diet.                           - Continue present medications.                           - Await pathology results.                           - Use Protonix (pantoprazole) 40 mg PO daily X 6                            refills                           -  Return to GI office in 3 months. Please call to                            schedule appointment. Mauri Pole, MD 07/13/2020 10:40:09 AM This report has been signed electronically.

## 2020-07-13 NOTE — Progress Notes (Signed)
Called to room to assist during endoscopic procedure.  Patient ID and intended procedure confirmed with present staff. Received instructions for my participation in the procedure from the performing physician.  

## 2020-07-13 NOTE — Progress Notes (Signed)
Pt's states no medical or surgical changes since previsit or office visit.  VS WR

## 2020-07-17 ENCOUNTER — Telehealth: Payer: Self-pay | Admitting: *Deleted

## 2020-07-17 ENCOUNTER — Telehealth: Payer: Self-pay

## 2020-07-17 NOTE — Telephone Encounter (Signed)
Left message on answering machine. 

## 2020-07-17 NOTE — Telephone Encounter (Signed)
  Follow up Call-  Call back number 07/13/2020 11/16/2019  Post procedure Call Back phone  # [phone]  Permission to leave phone message Yes Yes  Some recent data might be hidden     Patient questions:  Do you have a fever, pain , or abdominal swelling? No. Pain Score  0 *  Have you tolerated food without any problems? Yes.    Have you been able to return to your normal activities? Yes.    Do you have any questions about your discharge instructions: Diet   No. Medications  No. Follow up visit  No.  Do you have questions or concerns about your Care? No.  Actions: * If pain score is 4 or above: No action needed, pain <4.  1. Have you developed a fever since your procedure? no  2.   Have you had an respiratory symptoms (SOB or cough) since your procedure? no  3.   Have you tested positive for COVID 19 since your procedure no  4.   Have you had any family members/close contacts diagnosed with the COVID 19 since your procedure?  no   If yes to any of these questions please route to Joylene John, RN and Joella Prince, RN

## 2020-07-18 ENCOUNTER — Encounter: Payer: Medicare Other | Admitting: Gastroenterology

## 2020-07-20 ENCOUNTER — Encounter: Payer: Self-pay | Admitting: Gastroenterology

## 2020-11-20 ENCOUNTER — Inpatient Hospital Stay (HOSPITAL_BASED_OUTPATIENT_CLINIC_OR_DEPARTMENT_OTHER): Payer: Medicare Other | Admitting: Oncology

## 2020-11-20 ENCOUNTER — Inpatient Hospital Stay: Payer: Medicare Other | Attending: Oncology

## 2020-11-20 ENCOUNTER — Other Ambulatory Visit: Payer: Self-pay

## 2020-11-20 VITALS — BP 122/83 | HR 96 | Temp 97.9°F | Resp 18 | Ht 62.0 in | Wt 170.4 lb

## 2020-11-20 DIAGNOSIS — Z9221 Personal history of antineoplastic chemotherapy: Secondary | ICD-10-CM | POA: Diagnosis not present

## 2020-11-20 DIAGNOSIS — E038 Other specified hypothyroidism: Secondary | ICD-10-CM | POA: Insufficient documentation

## 2020-11-20 DIAGNOSIS — E039 Hypothyroidism, unspecified: Secondary | ICD-10-CM

## 2020-11-20 DIAGNOSIS — Z800 Family history of malignant neoplasm of digestive organs: Secondary | ICD-10-CM | POA: Diagnosis not present

## 2020-11-20 DIAGNOSIS — Z1509 Genetic susceptibility to other malignant neoplasm: Secondary | ICD-10-CM | POA: Insufficient documentation

## 2020-11-20 DIAGNOSIS — D509 Iron deficiency anemia, unspecified: Secondary | ICD-10-CM | POA: Diagnosis not present

## 2020-11-20 DIAGNOSIS — Z79899 Other long term (current) drug therapy: Secondary | ICD-10-CM | POA: Diagnosis not present

## 2020-11-20 DIAGNOSIS — Z85038 Personal history of other malignant neoplasm of large intestine: Secondary | ICD-10-CM | POA: Insufficient documentation

## 2020-11-20 DIAGNOSIS — C189 Malignant neoplasm of colon, unspecified: Secondary | ICD-10-CM

## 2020-11-20 LAB — CBC WITH DIFFERENTIAL (CANCER CENTER ONLY)
Abs Immature Granulocytes: 0.03 10*3/uL (ref 0.00–0.07)
Basophils Absolute: 0 10*3/uL (ref 0.0–0.1)
Basophils Relative: 1 %
Eosinophils Absolute: 0.1 10*3/uL (ref 0.0–0.5)
Eosinophils Relative: 1 %
HCT: 41.7 % (ref 36.0–46.0)
Hemoglobin: 14 g/dL (ref 12.0–15.0)
Immature Granulocytes: 0 %
Lymphocytes Relative: 13 %
Lymphs Abs: 1.2 10*3/uL (ref 0.7–4.0)
MCH: 30.9 pg (ref 26.0–34.0)
MCHC: 33.6 g/dL (ref 30.0–36.0)
MCV: 92.1 fL (ref 80.0–100.0)
Monocytes Absolute: 0.5 10*3/uL (ref 0.1–1.0)
Monocytes Relative: 5 %
Neutro Abs: 7.1 10*3/uL (ref 1.7–7.7)
Neutrophils Relative %: 80 %
Platelet Count: 253 10*3/uL (ref 150–400)
RBC: 4.53 MIL/uL (ref 3.87–5.11)
RDW: 12.2 % (ref 11.5–15.5)
WBC Count: 8.9 10*3/uL (ref 4.0–10.5)
nRBC: 0 % (ref 0.0–0.2)

## 2020-11-20 LAB — BASIC METABOLIC PANEL - CANCER CENTER ONLY
Anion gap: 8 (ref 5–15)
BUN: 6 mg/dL (ref 6–20)
CO2: 22 mmol/L (ref 22–32)
Calcium: 8.9 mg/dL (ref 8.9–10.3)
Chloride: 107 mmol/L (ref 98–111)
Creatinine: 0.78 mg/dL (ref 0.44–1.00)
GFR, Estimated: >60 mL/min (ref >60)
Glucose, Bld: 95 mg/dL (ref 70–99)
Potassium: 3.9 mmol/L (ref 3.5–5.1)
Sodium: 137 mmol/L (ref 135–145)

## 2020-11-20 LAB — CEA (IN HOUSE-CHCC): CEA (CHCC-In House): 4.21 ng/mL (ref 0.00–5.00)

## 2020-11-20 NOTE — Progress Notes (Signed)
[facility] OFFICE PROGRESS NOTE   Diagnosis: Colon cancer, hereditary nonpolyposis colon cancer syndrome  INTERVAL HISTORY:   [name] returns as scheduled.  She feels well.  No abdominal pain.  Good appetite and energy level.  No complaint.  Objective:  Vital signs in last 24 hours:  Blood pressure 122/83, pulse 96, temperature 97.9 F (36.6 C), temperature source Tympanic, resp. rate 18, height 5' 2"  (1.575 m), weight 170 lb 6.4 oz (77.3 kg), SpO2 99 %.    Lymphatics: No cervical, supraclavicular, axillary, or inguinal nodes Resp: Lungs clear bilaterally Cardio: Regular rate and rhythm GI: No hepatosplenomegaly, no mass, nontender Vascular: No leg edema   Lab Results:  Lab Results  Component Value Date   WBC 8.9 11/20/2020   HGB 14.0 11/20/2020   HCT 41.7 11/20/2020   MCV 92.1 11/20/2020   PLT 253 11/20/2020   NEUTROABS 7.1 11/20/2020    CMP  Lab Results  Component Value Date   NA 137 11/20/2020   K 3.9 11/20/2020   CL 107 11/20/2020   CO2 22 11/20/2020   GLUCOSE 95 11/20/2020   BUN 6 11/20/2020   CREATININE 0.78 11/20/2020   CALCIUM 8.9 11/20/2020   PROT 7.5 05/12/2019   ALBUMIN 4.1 05/12/2019   AST 19 05/12/2019   ALT 10 05/12/2019   ALKPHOS 62 05/12/2019   BILITOT 0.4 05/12/2019   GFRNONAA >60 11/20/2020   GFRAA >60 05/11/2020    Lab Results  Component Value Date   CEA1 3.93 05/11/2020     Medications: I have reviewed the patient's current medications.   Assessment/Plan: 1. Stage IV Colon cancer, cecum, presenting with a palpable low abdominal/pelvic mass ? Colonoscopy 03/31/2017 confirmed a cecum mass, biopsy positive for poorly differentiated adenocarcinoma ? CT abdomen/pelvis 03/29/2017-large necrotic right iliac fossa mass, enlarged right common iliac nodes, loculated pelvic ascites, right lower quadrant mesenteric masses ? Right colectomy/right oophorectomy-salpingectomy 04/04/2017 ? Pathology from the right colon  resection, grade 3 adenocarcinoma of the cecum, pT4b,pN2b ? Loss of MSH6 expression ? PET scan 04/29/2017-residual tumor at the left adnexa, and internal iliac lymph node, and aortocaval node ? Cycle 1 Pembrolizumab09/07/2017 ? Cycle 2 Pembrolizumab 05/30/2017 ? Cycle 3 Pembrolizumab 06/20/2017 ? Cycle4 Pembrolizumab 07/11/2017 ? Cycle 5 Pembrolizumab 08/04/2017 ? Cycle 6 pembrolizumab 08/22/2017 ? Cycle 7 pembrolizumab 09/12/2017 ? CTs 09/29/2017-previously noted retroperitoneal and right lower quadrant adenopathy has resolved, necrotic pelvic mass not visualized, single enhancing nodule at the umbilicus is nonspecific ? Cycle 8 pembrolizumab 10/03/2017 ? Cycle 9 Pembrolizumab 10/24/2017 ? Cycle 10 Pembrolizumab 11/14/2017 ? Cycle 11 pembrolizumab 12/05/2017 ? Cycle 12 pembrolizumab 12/26/2017 ? Cycle 13 Pembrolizumab 01/16/2018 ? Cycle 14 pembrolizumab 02/06/2018 ? Cycle 15 Pembrolizumab 02/27/2018 ? Cycle 16 Pembrolizumab 03/20/2018 ? CT 04/10/2018-no evidence of recurrent malignancy, stable subcutaneous lesion at the anterior abdominal wall ? Cycle 17 pembrolizumab 04/10/2018 ? Cycle 18 Pembrolizumab 05/01/2018 ? Cycle 19 Pembrolizumab 05/22/2018 ? Cycle 20 Pembrolizumab 06/12/2018 ? Cycle 21 pembrolizumab 07/03/2018 ? Cycle 22 Pembrolizumab 07/24/2018 ? Cycle 23 pembrolizumab 08/14/2018 ? Cycle 24 pembrolizumab 09/04/2018 ? Cycle 25 pembrolizumab 09/25/2018 ? Cycle 26 Pembrolizumab 10/16/2018 ? Cycle 27 pembrolizumab 11/06/2018 ? Cycle 28 pembrolizumab 12/18/2018 ? Cycle 29 Pembrolizumab 01/08/2019 ? Cycle 30Pembrolizumab 01/29/2019 ? Cycle 31 pembrolizumab 02/19/2019 ? Cycle 32 pembrolizumab 03/12/2019 ? Cycle 33 pembrolizumab 04/02/2019 ? Cycle 34 pembrolizumab 04/23/2019 ? Cycle 35 pembrolizumab 05/14/2019 ? CTs 05/12/2019-no evidence of recurrent disease ? CTs 05/11/2020-no evidence of recurrent disease  2. Lynch syndrome. She is positive for  a pathogenic mutation in MSH6  3. Anemia-likely  iron deficiency anemia secondary to #1.Resolved.  4.G3 P3  5. Appendectomy  5. Maternal aunt with colon cancer  6.Hypothyroid-likely secondary to toxicity from pembrolizumab, thyroid hormone replacement started 08/22/2017  7.Port-A-Cath placement 11/28/2017; Port-A-Cath removed 06/04/2019    Disposition: [name] is in clinical remission from colon cancer.  We will follow up on the TSH from today.  She will return for an office visit and restaging CTs in 6 months.  She is due for a surveillance colonoscopy with Dr. Silverio Decamp.  Betsy Coder, MD  11/20/2020  10:27 AM

## 2020-11-21 LAB — THYROID PANEL WITH TSH
Free Thyroxine Index: 1.4 (ref 1.2–4.9)
T3 Uptake Ratio: 22 % — ABNORMAL LOW (ref 24–39)
T4, Total: 6.5 ug/dL (ref 4.5–12.0)
TSH: 3.55 u[IU]/mL (ref 0.450–4.500)

## 2021-01-23 ENCOUNTER — Telehealth: Payer: Self-pay | Admitting: Gastroenterology

## 2021-01-23 DIAGNOSIS — K2900 Acute gastritis without bleeding: Secondary | ICD-10-CM

## 2021-01-23 MED ORDER — PANTOPRAZOLE SODIUM 40 MG PO TBEC: 40.0000 mg | DELAYED_RELEASE_TABLET | Freq: Every day | ORAL | 6 refills | Status: DC

## 2021-01-23 NOTE — Telephone Encounter (Signed)
Patient is requesting a RF on Protonix

## 2021-01-23 NOTE — Telephone Encounter (Signed)
Sent refills of Protonix to [facility] , pt will need a follow up appointment

## 2021-03-26 ENCOUNTER — Telehealth: Payer: Self-pay | Admitting: Oncology

## 2021-03-26 NOTE — Telephone Encounter (Signed)
Patient called in stating she tested positive for COVID today and wanted Dr Benay Spice to call in [place], I discussed with Merceda Elks, RN and she stated that she would need to contact per PCP for this medication.  She doesn't have a PCP and she was referred to Urgent Care. Patient voiced complete understanding of this information

## 2021-04-30 ENCOUNTER — Ambulatory Visit (AMBULATORY_SURGERY_CENTER): Payer: Medicare Other | Admitting: *Deleted

## 2021-04-30 ENCOUNTER — Other Ambulatory Visit: Payer: Self-pay

## 2021-04-30 VITALS — Ht 62.0 in | Wt 180.0 lb

## 2021-04-30 DIAGNOSIS — C189 Malignant neoplasm of colon, unspecified: Secondary | ICD-10-CM

## 2021-04-30 MED ORDER — ONDANSETRON HCL 4 MG PO TABS: 4.0000 mg | ORAL_TABLET | ORAL | 0 refills | Status: DC

## 2021-04-30 MED ORDER — CLENPIQ 10-3.5-12 MG-GM -GM/160ML PO SOLN: 1.0000 | ORAL | 0 refills | Status: DC

## 2021-04-30 MED ORDER — NA SULFATE-K SULFATE-MG SULF 17.5-3.13-1.6 GM/177ML PO SOLN: 1.0000 | Freq: Once | ORAL | 0 refills | Status: DC

## 2021-04-30 NOTE — Progress Notes (Signed)
No egg or soy allergy known to patient  No issues with past sedation with any surgeries or procedures Patient denies ever being told they had issues or difficulty with intubation  No FH of Malignant Hyperthermia No diet pills per patient No home 02 use per patient  No blood thinners per patient  Pt denies issues with constipation  No A fib or A flutter  EMMI video to pt or via [place] 19 guidelines implemented in [place] today with Pt and RN   Pt is fully vaccinated  for Covid   Pt states she had severe vomiting with Suprep last colon and she will not be able to drink that for her Andrena Mews- It looks like Clenpiq is covered - I sent in script to her Pharmacy- she will call if there are any issues- I also sent in Zofran for nausea prior to each prep dose- explained she has to drink LOTS of water with this prep- she verbalized understanding     Due to the COVID-19 pandemic we are asking patients to follow certain guidelines.  Pt aware of COVID protocols and [facility] guidelines   Pt verified name, DOB, address and insurance during PV today.  Pt mailed instruction packet of [place] video, copy of consent form to read and not return, and instructions.  PV completed over the phone.  Pt encouraged to call with questions or issues.  My Chart instructions to pt as well

## 2021-05-14 ENCOUNTER — Other Ambulatory Visit: Payer: Self-pay

## 2021-05-14 ENCOUNTER — Ambulatory Visit (AMBULATORY_SURGERY_CENTER): Payer: Medicare Other | Admitting: Gastroenterology

## 2021-05-14 ENCOUNTER — Encounter: Payer: Self-pay | Admitting: Gastroenterology

## 2021-05-14 VITALS — BP 102/70 | HR 81 | Temp 97.5°F | Resp 16 | Ht 62.0 in | Wt 180.0 lb

## 2021-05-14 DIAGNOSIS — D123 Benign neoplasm of transverse colon: Secondary | ICD-10-CM | POA: Diagnosis not present

## 2021-05-14 DIAGNOSIS — D128 Benign neoplasm of rectum: Secondary | ICD-10-CM

## 2021-05-14 DIAGNOSIS — Z1509 Genetic susceptibility to other malignant neoplasm: Secondary | ICD-10-CM | POA: Diagnosis not present

## 2021-05-14 DIAGNOSIS — Z85038 Personal history of other malignant neoplasm of large intestine: Secondary | ICD-10-CM

## 2021-05-14 DIAGNOSIS — K621 Rectal polyp: Secondary | ICD-10-CM

## 2021-05-14 DIAGNOSIS — K9189 Other postprocedural complications and disorders of digestive system: Secondary | ICD-10-CM | POA: Diagnosis not present

## 2021-05-14 DIAGNOSIS — K2900 Acute gastritis without bleeding: Secondary | ICD-10-CM

## 2021-05-14 DIAGNOSIS — Z800 Family history of malignant neoplasm of digestive organs: Secondary | ICD-10-CM

## 2021-05-14 MED ORDER — PANTOPRAZOLE SODIUM 40 MG PO TBEC: 40.0000 mg | DELAYED_RELEASE_TABLET | Freq: Every day | ORAL | 6 refills | Status: DC

## 2021-05-14 MED ORDER — SODIUM CHLORIDE 0.9 % IV SOLN: 500.0000 mL | INTRAVENOUS | Status: DC

## 2021-05-14 NOTE — Progress Notes (Signed)
To PACU, VSS. Report to [email or web address] 

## 2021-05-14 NOTE — Progress Notes (Signed)
Meagan [name]   Primary Care Physician:  Patient, No Pcp Per (Inactive)   Reason for Procedure:  History of adenomatous colon polyps, colon cancer, Lynch syndrome  Plan:    Surveillance colonoscopy with possible interventions as needed     HPI: Meagan [name] is a very pleasant 35 y.o. female here for colonoscopy. Denies any nausea, vomiting, abdominal pain, melena or bright red blood per rectum  The risks and benefits as well as alternatives of endoscopic procedure(s) have been discussed and reviewed. All questions answered. The patient agrees to proceed.    Past Medical History:  Diagnosis Date   adenocarcinoma of colon dx'd 04/2017   immunotherapy   Allergy    per pt   Anxiety    situational   04-30-2021 no issues per pt   Fallopian tube abscess    June 2018 right side   Genetic testing 05/07/2017   Meagan [name] underwent genetic counseling and testing for hereditary cancer syndromes on 04/18/2017. 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 Mutations in MSH6 are associated with a hereditary cancer syndrome called Lynch syndrome. For more detailed discussion, please see genetic counseling documentation from 05/07/2017.   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 Mutations in MSH6 are associated with a hereditary cancer syndrome called Lynch syndrome. For more detailed discussion, please see genetic counseling documentation from 05/07/2017.   Testing was perform   Ovarian cyst    Thyroid disease    per pt from immune therapy for cancer.    Past Surgical History:  Procedure Laterality Date   APPENDECTOMY  2010   COLON RESECTION  Right 04/04/2017   Procedure: OPEN RIGHT COLECTOMY AND RIGHT SALPINGO-OOPHORECTOMY;  Surgeon: Armandina Gemma, MD;  Location: [facility];  Service: General;  Laterality: Right;   COLONOSCOPY N/A 03/31/2017   Procedure: COLONOSCOPY;  Surgeon: Mauri Pole, MD;  Location: [facility];  Service: Endoscopy;  Laterality: N/A;   COLONOSCOPY  2018   CYSTOSCOPY WITH STENT PLACEMENT Bilateral 04/04/2017   Procedure: CYSTOSCOPY WITH STENT PLACEMENT;  Surgeon: Alexis Frock, MD;  Location: [facility];  Service: Urology;  Laterality: Bilateral;   IR FLUORO GUIDE PORT INSERTION RIGHT  11/28/2017   IR REMOVAL TUN ACCESS W/ PORT W/O FL MOD SED  06/04/2019   IR US GUIDE VASC ACCESS RIGHT  11/28/2017   UPPER GASTROINTESTINAL ENDOSCOPY  2018    Prior to Admission medications   Medication Sig Start Date End Date Taking? Authorizing Provider  levothyroxine (SYNTHROID) 88 MCG tablet TAKE 1 TABLET(88 MCG) BY MOUTH DAILY BEFORE BREAKFAST 05/22/20  Yes Owens Shark, NP  ondansetron (ZOFRAN) 4 MG tablet Take 1 tablet (4 mg total) by mouth as directed. 04/30/21  Yes Kenzel Ruesch, Venia Minks, MD  pantoprazole (PROTONIX) 40 MG tablet Take 1 tablet (40 mg total) by mouth daily. 01/23/21  Yes Tareka Jhaveri, Venia Minks, MD  Sod Picosulfate-Mag Ox-Cit Acd (CLENPIQ) 10-3.5-12 MG-GM -GM/160ML SOLN Take 1 kit by mouth as directed. 04/30/21  Yes Mauri Pole, MD  aspirin-acetaminophen-caffeine (EXCEDRIN MIGRAINE) [phone] MG tablet Take by mouth every 6 (six) hours as needed for headache.    [provider]  etonogestrel (NEXPLANON) 68 MG IMPL implant Inject into the skin.    [provider]    Current Outpatient Medications  Medication Sig Dispense Refill   levothyroxine (SYNTHROID) 88 MCG tablet TAKE 1 TABLET(88 MCG) BY MOUTH DAILY BEFORE BREAKFAST 30 tablet 6   ondansetron (ZOFRAN) 4 MG tablet Take 1 tablet (4 mg total) by mouth as directed. 2 tablet 0   pantoprazole (PROTONIX) 40 MG tablet Take 1 tablet (40 mg  total) by mouth daily. 30 tablet 6   Sod Picosulfate-Mag Ox-Cit Acd (CLENPIQ) 10-3.5-12 MG-GM -GM/160ML SOLN Take 1 kit by mouth as directed. 320 mL 0   aspirin-acetaminophen-caffeine (EXCEDRIN MIGRAINE) 250-250-65 MG tablet Take by mouth every 6 (six) hours as needed for headache.     etonogestrel (NEXPLANON) 68 MG IMPL implant Inject into the skin.     Current Facility-Administered Medications  Medication Dose Route Frequency Provider Last Rate Last Admin   0.9 %  sodium chloride infusion  500 mL Intravenous Continuous Samariah Hokenson V, MD        Allergies as of 05/14/2021   (No Known Allergies)    Family History  Problem Relation Age of Onset   Leukemia Father 58       "Just got diagnosed, but not the 'serious type'" (sounds like CLL if I had to guess)   Colon cancer Maternal Uncle    Colon cancer Maternal Grandmother        d.60s/70s diagnosed with colon cancer after age 48   Colon polyps Neg Hx    Esophageal cancer Neg Hx    Rectal cancer Neg Hx    Stomach cancer Neg Hx     Social History   Socioeconomic History   Marital status: Divorced    Spouse name: Not on file   Number of children: Not on file   Years of education: Not on file   Highest education level: Not on file  Occupational History   Not on file  Tobacco Use   Smoking status: Every Day    Packs/day: 0.20    Years: 0.50    Pack years: 0.10    Types: Cigarettes   Smokeless tobacco: Never  Vaping Use   Vaping Use: Never used  Substance and Sexual Activity   Alcohol use: No   Drug use: No   Sexual activity: Yes  Other Topics Concern   Not on file  Social History Narrative   Not on file   Social Determinants of Health   Financial Resource Strain: Not on file  Food Insecurity: Not on file  Transportation Needs: Not on file  [name] Activity: Not on file  Stress: Not on file  Social Connections: Not on file  Intimate Partner Violence: Not on file    Review of Systems:  All other review  of systems negative except as mentioned in the HPI.  [name] Exam: Vital signs in last 24 hours: BP 114/63   Pulse 92   Temp (!) 97.5 F (36.4 C) (Temporal)   Ht _0  (1.575 m)   Wt 180 lb (81.6 kg)   LMP  (LMP Unknown) Comment: irregular  SpO2 99%   BMI 32.92 kg/m     General:   Alert, NAD Lungs:  Clear .   Heart:  Regular rate and rhythm Abdomen:  Soft, nontender and nondistended. Neuro/Psych:  Alert and cooperative. Normal mood and affect. A and O x 3  Reviewed labs, radiology imaging, old records and pertinent past GI work up  Patient is appropriate for planned procedure(s) and anesthesia in an ambulatory setting   K. Margarette Asal  Silverio Decamp , [place]

## 2021-05-14 NOTE — Progress Notes (Signed)
Pt's states no medical or surgical changes since previsit or office visit. 

## 2021-05-14 NOTE — Op Note (Signed)
[name]: Meagan [name] Procedure Date: 05/14/2021 10:57 AM MRN: [id number] Endoscopist: Mauri Pole , MD Age: 35 Referring MD:  Date of Birth: July 07, 1986 Gender: Female Account #: [id number] Procedure:                Colonoscopy Indications:              High risk colon cancer surveillance: Personal                            history of colonic polyps, High risk colon cancer                            surveillance: Personal history of colon cancer,                            Family history of hereditary nonpolyposis                            colorectal cancer in a first-degree relative, Lynch                            Syndrome, Personal history of hereditary                            nonpolyposis colorectal cancer Medicines:                Monitored Anesthesia Care Procedure:                Pre-Anesthesia Assessment:                           - Prior to the procedure, a History and Physical                            was performed, and patient medications and                            allergies were reviewed. The patient's tolerance of                            previous anesthesia was also reviewed. The risks                            and benefits of the procedure and the sedation                            options and risks were discussed with the patient.                            All questions were answered, and informed consent                            was obtained. Prior Anticoagulants: The patient has  taken no previous anticoagulant or antiplatelet                            agents. ASA Grade Assessment: III - A patient with                            severe systemic disease. After reviewing the risks                            and benefits, the patient was deemed in                            satisfactory condition to undergo the procedure.                           After obtaining informed consent, the  colonoscope                            was passed under direct vision. Throughout the                            procedure, the patient's blood pressure, pulse, and                            oxygen saturations were monitored continuously. The                            PCF-HQ190L Colonoscope was introduced through the                            anus and advanced to the the ileocolonic                            anastomosis. The colonoscopy was performed without                            difficulty. The patient tolerated the procedure                            well. The quality of the bowel preparation was                            excellent. The ileocecal valve, appendiceal                            orifice, and rectum were photographed. Scope In: 11:08:48 AM Scope Out: 11:20:09 AM Scope Withdrawal Time: 0 hours 6 minutes 19 seconds  Total Procedure Duration: 0 hours 11 minutes 21 seconds  Findings:                 The perianal and digital rectal examinations were                            normal.  A 10 mm polyp was found in the proximal transverse                            colon. The polyp was sessile. The polyp was removed                            with a cold snare. Resection and retrieval were                            complete.                           A less than 1 mm polyp was found in the rectum. The                            polyp was sessile. The polyp was removed with a                            cold biopsy forceps. Resection and retrieval were                            complete.                           There was evidence of a prior end-to-side                            ileo-colonic anastomosis at the hepatic flexure.                            This was patent and was characterized by healthy                            appearing mucosa. The anastomosis was traversed.                           Non-bleeding external and internal  hemorrhoids were                            found during retroflexion. The hemorrhoids were                            small. Complications:            No immediate complications. Estimated Blood Loss:     Estimated blood loss was minimal. Impression:               - One 10 mm polyp in the proximal transverse colon,                            removed with a cold snare. Resected and retrieved.                           - One less than 1 mm polyp in the rectum, removed  with a cold biopsy forceps. Resected and retrieved.                           - Patent end-to-side ileo-colonic anastomosis,                            characterized by healthy appearing mucosa.                           - Non-bleeding external and internal hemorrhoids. Recommendation:           - Patient has a contact number available for                            emergencies. The signs and symptoms of potential                            delayed complications were discussed with the                            patient. Return to normal activities tomorrow.                            Written discharge instructions were provided to the                            patient.                           - Resume previous diet.                           - Continue present medications.                           - Await pathology results.                           - Repeat colonoscopy in 1 year for surveillance                            based on pathology results. Mauri Pole, MD 05/14/2021 11:26:15 AM This report has been signed electronically.

## 2021-05-14 NOTE — Patient Instructions (Signed)
Resume previous diet and present medications. Awaiting pathology results. Repeat Colonoscopy in 1 year for surveillance based on pathology results.  YOU HAD AN ENDOSCOPIC PROCEDURE TODAY AT [facility]:   Refer to the procedure report that was given to you for any specific questions about what was found during the examination.  If the procedure report does not answer your questions, please call your gastroenterologist to clarify.  If you requested that your care partner not be given the details of your procedure findings, then the procedure report has been included in a sealed envelope for you to review at your convenience later.  YOU SHOULD EXPECT: Some feelings of bloating in the abdomen. Passage of more gas than usual.  Walking can help get rid of the air that was put into your GI tract during the procedure and reduce the bloating. If you had a lower endoscopy (such as a colonoscopy or flexible sigmoidoscopy) you may notice spotting of blood in your stool or on the toilet paper. If you underwent a bowel prep for your procedure, you may not have a normal bowel movement for a few days.  Please Note:  You might notice some irritation and congestion in your nose or some drainage.  This is from the oxygen used during your procedure.  There is no need for concern and it should clear up in a day or so.  SYMPTOMS TO REPORT IMMEDIATELY:  Following lower endoscopy (colonoscopy or flexible sigmoidoscopy):  Excessive amounts of blood in the stool  Significant tenderness or worsening of abdominal pains  Swelling of the abdomen that is new, acute  Fever of 100F or higher   For urgent or emergent issues, a gastroenterologist can be reached at any hour by calling [phone]. Do not use MyChart messaging for urgent concerns.    DIET:  We do recommend a small meal at first, but then you may proceed to your regular diet.  Drink plenty of fluids but you should avoid alcoholic beverages for  24 hours.  ACTIVITY:  You should plan to take it easy for the rest of today and you should NOT DRIVE or use heavy machinery until tomorrow (because of the sedation medicines used during the test).    FOLLOW UP: Our staff will call the number listed on your records 48-72 hours following your procedure to check on you and address any questions or concerns that you may have regarding the information given to you following your procedure. If we do not reach you, we will leave a message.  We will attempt to reach you two times.  During this call, we will ask if you have developed any symptoms of COVID 19. If you develop any symptoms (ie: fever, flu-like symptoms, shortness of breath, cough etc.) before then, please call [phone].  If you test positive for Covid 19 in the 2 weeks post procedure, please call and report this information to [place].    If any biopsies were taken you will be contacted by phone or by letter within the next 1-3 weeks.  Please call us at [phone] if you have not heard about the biopsies in 3 weeks.    SIGNATURES/CONFIDENTIALITY: You and/or your care partner have signed paperwork which will be entered into your electronic medical record.  These signatures attest to the fact that that the information above on your After Visit Summary has been reviewed and is understood.  Full responsibility of the confidentiality of this discharge information lies with you and/or  your care-partner.

## 2021-05-14 NOTE — Progress Notes (Signed)
Called to room to assist during endoscopic procedure.  Patient ID and intended procedure confirmed with present staff. Received instructions for my participation in the procedure from the performing physician.  

## 2021-05-16 ENCOUNTER — Telehealth: Payer: Self-pay

## 2021-05-16 NOTE — Telephone Encounter (Signed)
  Follow up Call-  Call back number 05/14/2021 07/13/2020 11/16/2019  Post procedure Call Back phone  # [phone]  Permission to leave phone message Yes Yes Yes  Some recent data might be hidden     Patient questions:  Do you have a fever, pain , or abdominal swelling? No. Pain Score  0 *  Have you tolerated food without any problems? Yes.    Have you been able to return to your normal activities? Yes.    Do you have any questions about your discharge instructions: Diet   No. Medications  No. Follow up visit  No.  Do you have questions or concerns about your Care? No.  Actions: * If pain score is 4 or above: No action needed, pain <4.

## 2021-05-22 ENCOUNTER — Ambulatory Visit (HOSPITAL_BASED_OUTPATIENT_CLINIC_OR_DEPARTMENT_OTHER)
Admission: RE | Admit: 2021-05-22 | Discharge: 2021-05-22 | Disposition: A | Discharge status: Home / Self Care | Payer: Medicare Other | Source: Ambulatory Visit | Attending: Oncology | Admitting: Oncology

## 2021-05-22 ENCOUNTER — Encounter (HOSPITAL_BASED_OUTPATIENT_CLINIC_OR_DEPARTMENT_OTHER): Payer: Self-pay

## 2021-05-22 ENCOUNTER — Inpatient Hospital Stay: Payer: Medicare Other | Attending: Oncology | Admitting: Oncology

## 2021-05-22 ENCOUNTER — Other Ambulatory Visit: Payer: Self-pay

## 2021-05-22 ENCOUNTER — Inpatient Hospital Stay: Payer: Medicare Other

## 2021-05-22 VITALS — BP 116/89 | HR 92 | Temp 98.2°F | Resp 18 | Ht 62.0 in | Wt 179.8 lb

## 2021-05-22 DIAGNOSIS — Z79899 Other long term (current) drug therapy: Secondary | ICD-10-CM | POA: Diagnosis not present

## 2021-05-22 DIAGNOSIS — Z803 Family history of malignant neoplasm of breast: Secondary | ICD-10-CM | POA: Insufficient documentation

## 2021-05-22 DIAGNOSIS — Z1509 Genetic susceptibility to other malignant neoplasm: Secondary | ICD-10-CM | POA: Diagnosis not present

## 2021-05-22 DIAGNOSIS — Z85038 Personal history of other malignant neoplasm of large intestine: Secondary | ICD-10-CM | POA: Diagnosis not present

## 2021-05-22 DIAGNOSIS — E039 Hypothyroidism, unspecified: Secondary | ICD-10-CM | POA: Insufficient documentation

## 2021-05-22 DIAGNOSIS — Z9221 Personal history of antineoplastic chemotherapy: Secondary | ICD-10-CM | POA: Insufficient documentation

## 2021-05-22 DIAGNOSIS — C189 Malignant neoplasm of colon, unspecified: Secondary | ICD-10-CM

## 2021-05-22 DIAGNOSIS — D509 Iron deficiency anemia, unspecified: Secondary | ICD-10-CM | POA: Insufficient documentation

## 2021-05-22 LAB — BASIC METABOLIC PANEL - CANCER CENTER ONLY
Anion gap: 9 (ref 5–15)
BUN: 6 mg/dL (ref 6–20)
CO2: 25 mmol/L (ref 22–32)
Calcium: 9.4 mg/dL (ref 8.9–10.3)
Chloride: 104 mmol/L (ref 98–111)
Creatinine: 0.63 mg/dL (ref 0.44–1.00)
GFR, Estimated: >60 mL/min (ref >60)
Glucose, Bld: 91 mg/dL (ref 70–99)
Potassium: 3.6 mmol/L (ref 3.5–5.1)
Sodium: 138 mmol/L (ref 135–145)

## 2021-05-22 LAB — CEA (IN HOUSE-CHCC): CEA (CHCC-In House): 4.28 ng/mL (ref 0.00–5.00)

## 2021-05-22 LAB — TSH: TSH: 4.021 u[IU]/mL (ref 0.350–4.500)

## 2021-05-22 LAB — CEA (ACCESS): CEA (CHCC): 3.51 ng/mL (ref 0.00–5.00)

## 2021-05-22 MED ORDER — IOHEXOL 350 MG/ML SOLN
80.0000 mL | Freq: Once | INTRAVENOUS | Status: AC | PRN
  Administered 2021-05-22: 80 mL via INTRAVENOUS

## 2021-05-22 NOTE — Progress Notes (Signed)
[facility] OFFICE PROGRESS NOTE   Diagnosis: Colon cancer  INTERVAL HISTORY:   Meagan [name] returns as scheduled.  She feels well.  No difficulty with bowel function.  No complaint.  She is working. She underwent a colonoscopy on 05/14/2021. Polyps were removed from the transverse colon and rectum.  The pathology revealed a sessile serrated polyp and hyperplastic polyp. Objective:  Vital signs in last 24 hours:  Blood pressure 116/89, pulse 92, temperature 98.2 F (36.8 C), temperature source Oral, resp. rate 18, height _0  (1.575 m), weight 179 lb 12.8 oz (81.6 kg), last menstrual period 05/22/2021, SpO2 98 %.    Lymphatics: No cervical, supraclavicular, axillary, or inguinal nodes Resp: Lungs clear bilaterally Cardio: Regular rate and rhythm GI: No hepatosplenomegaly, no mass, nontender Vascular: No leg edema     Lab Results:  Lab Results  Component Value Date   WBC 8.9 11/20/2020   HGB 14.0 11/20/2020   HCT 41.7 11/20/2020   MCV 92.1 11/20/2020   PLT 253 11/20/2020   NEUTROABS 7.1 11/20/2020    CMP  Lab Results  Component Value Date   NA 138 05/22/2021   K 3.6 05/22/2021   CL 104 05/22/2021   CO2 25 05/22/2021   GLUCOSE 91 05/22/2021   BUN 6 05/22/2021   CREATININE 0.63 05/22/2021   CALCIUM 9.4 05/22/2021   PROT 7.5 05/12/2019   ALBUMIN 4.1 05/12/2019   AST 19 05/12/2019   ALT 10 05/12/2019   ALKPHOS 62 05/12/2019   BILITOT 0.4 05/12/2019   GFRNONAA >60 05/22/2021   GFRAA >60 05/11/2020    Lab Results  Component Value Date   CEA1 4.21 11/20/2020   CEA 3.51 05/22/2021      Medications: I have reviewed the patient's current medications.   Assessment/Plan:  Stage IV Colon cancer, cecum, presenting with a palpable low abdominal/pelvic mass Colonoscopy 03/31/2017 confirmed a cecum mass, biopsy positive for poorly differentiated adenocarcinoma CT abdomen/pelvis 03/29/2017-large necrotic right iliac fossa mass, enlarged right  common iliac nodes, loculated pelvic ascites, right lower quadrant mesenteric masses Right colectomy/right oophorectomy-salpingectomy 04/04/2017 Pathology from the right colon resection, grade 3 adenocarcinoma of the cecum, pT4b,pN2b Loss of MSH6 expression PET scan 04/29/2017-residual tumor at the left adnexa, and internal iliac lymph node, and aortocaval node Cycle 1 Pembrolizumab 05/13/2017 Cycle 2 Pembrolizumab 05/30/2017 Cycle 3 Pembrolizumab 06/20/2017 Cycle 4 Pembrolizumab 07/11/2017 Cycle 5 Pembrolizumab 08/04/2017 Cycle 6 pembrolizumab 08/22/2017 Cycle 7 pembrolizumab 09/12/2017 CTs 09/29/2017-previously noted retroperitoneal and right lower quadrant adenopathy has resolved, necrotic pelvic mass not visualized, single enhancing nodule at the umbilicus is nonspecific Cycle 8 pembrolizumab 10/03/2017 Cycle 9 Pembrolizumab 10/24/2017 Cycle 10 Pembrolizumab 11/14/2017 Cycle 11 pembrolizumab 12/05/2017 Cycle 12 pembrolizumab 12/26/2017 Cycle 13 Pembrolizumab 01/16/2018 Cycle 14 pembrolizumab 02/06/2018 Cycle 15 Pembrolizumab 02/27/2018 Cycle 16 Pembrolizumab 03/20/2018 CT 04/10/2018-no evidence of recurrent malignancy, stable subcutaneous lesion at the anterior abdominal wall Cycle 17 pembrolizumab 04/10/2018 Cycle 18 Pembrolizumab 05/01/2018 Cycle 19 Pembrolizumab 05/22/2018 Cycle 20 Pembrolizumab 06/12/2018 Cycle 21 pembrolizumab 07/03/2018 Cycle 22 Pembrolizumab 07/24/2018 Cycle 23 pembrolizumab 08/14/2018 Cycle 24 pembrolizumab 09/04/2018 Cycle 25 pembrolizumab 09/25/2018 Cycle 26 Pembrolizumab 10/16/2018 Cycle 27 pembrolizumab 11/06/2018 Cycle 28 pembrolizumab 12/18/2018 Cycle 29 Pembrolizumab 01/08/2019 Cycle 30 Pembrolizumab 01/29/2019 Cycle 31 pembrolizumab 02/19/2019 Cycle 32 pembrolizumab 03/12/2019 Cycle 33 pembrolizumab 04/02/2019 Cycle 34 pembrolizumab 04/23/2019 Cycle 35 pembrolizumab 05/14/2019 CTs 05/12/2019- no evidence of recurrent disease CTs 05/11/2020-no evidence of recurrent disease   2.    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   Anemia-likely iron deficiency anemia secondary to #1.  Resolved.   4.   G3 P3   5.   Appendectomy   5.    Maternal aunt with colon cancer   6.   Hypothyroid-likely secondary to toxicity from pembrolizumab, thyroid hormone replacement started 08/22/2017   7.   Port-A-Cath placement 11/28/2017; Port-A-Cath removed 06/04/2019    Disposition: Meagan [name] remains in clinical remission from colon cancer.  We will follow-up on results of surveillance CTs obtained earlier today.  She continues thyroid hormone replacement.  We will follow-up on the TSH from today.  She will return for an office visit in 6 months.  She will continue colonoscopy and upper endoscopy surveillance with Dr. Silverio Decamp.  Betsy Coder, MD  05/22/2021  11:13 AM

## 2021-05-23 ENCOUNTER — Telehealth: Payer: Self-pay

## 2021-05-23 ENCOUNTER — Encounter: Payer: Self-pay | Admitting: Gastroenterology

## 2021-05-23 NOTE — Telephone Encounter (Signed)
-----   Message from Ladell Pier, MD sent at 05/23/2021  1:54 PM EDT ----- Please call patient, CT showed no evidence of recurrent colon cancer, there is a small nodule on the left adrenal gland that has enlarged slightly over the past few years, an MRI is recommended to better characterize this nodule, likely a benign fatty growth on the adrenal gland,

## 2021-05-23 NOTE — Telephone Encounter (Signed)
Relayed message to Pt about CT showing no evidence of recurrent colon cancer. Also relayed message about small nodule on left adrenal gland that has enlarged. Informed Pt that Dr Benay Spice thinks its most likely a benign fatty growth. Informed her a MRI is recommended. Pt became emotional thinking the growth could be cancer informed her that's not what Dr Benay Spice thinks it is. explained to her what adrenal glands and would she like to get the MRI to be sure. Pt stated she would like to get the MRI informed her I will let Dr Benay Spice know and we will give her a return call to schedule. Pt verbalized understanding. No further problems or concerns noted.

## 2021-05-25 ENCOUNTER — Other Ambulatory Visit: Payer: Self-pay | Admitting: Nurse Practitioner

## 2021-05-25 DIAGNOSIS — E039 Hypothyroidism, unspecified: Secondary | ICD-10-CM

## 2021-05-25 DIAGNOSIS — C189 Malignant neoplasm of colon, unspecified: Secondary | ICD-10-CM

## 2021-05-25 MED ORDER — LEVOTHYROXINE SODIUM 88 MCG PO TABS: ORAL_TABLET | ORAL | 6 refills | Status: DC

## 2021-05-28 ENCOUNTER — Other Ambulatory Visit: Payer: Self-pay

## 2021-05-28 NOTE — Progress Notes (Signed)
Synthroid refill completed

## 2021-05-31 ENCOUNTER — Other Ambulatory Visit: Payer: Self-pay | Admitting: Oncology

## 2021-05-31 DIAGNOSIS — C189 Malignant neoplasm of colon, unspecified: Secondary | ICD-10-CM

## 2021-06-27 ENCOUNTER — Other Ambulatory Visit: Payer: Medicare Other

## 2021-07-18 ENCOUNTER — Ambulatory Visit
Admission: RE | Admit: 2021-07-18 | Discharge: 2021-07-18 | Disposition: A | Discharge status: Home / Self Care | Payer: Medicare Other | Source: Ambulatory Visit | Attending: Oncology | Admitting: Oncology

## 2021-07-18 ENCOUNTER — Other Ambulatory Visit: Payer: Self-pay

## 2021-07-18 DIAGNOSIS — C189 Malignant neoplasm of colon, unspecified: Secondary | ICD-10-CM

## 2021-07-18 MED ORDER — GADOBENATE DIMEGLUMINE 529 MG/ML IV SOLN
16.0000 mL | Freq: Once | INTRAVENOUS | Status: AC | PRN
  Administered 2021-07-18: 16 mL via INTRAVENOUS

## 2021-07-19 ENCOUNTER — Telehealth: Payer: Self-pay

## 2021-07-19 NOTE — Telephone Encounter (Addendum)
Patient informed of MRI results.   ----- Message from Ladell Pier, MD sent at 07/19/2021  6:29 AM EST ----- Please call patient, MRI findings consistent with benign adrenal lesion, f/u as scheduled

## 2021-07-19 NOTE — Telephone Encounter (Signed)
-----   Message from Ladell Pier, MD sent at 07/19/2021  6:29 AM EST ----- Please call patient, MRI findings consistent with benign adrenal lesion, f/u as scheduled

## 2021-09-18 ENCOUNTER — Other Ambulatory Visit: Payer: Self-pay | Admitting: Gastroenterology

## 2021-11-16 ENCOUNTER — Other Ambulatory Visit: Payer: Self-pay | Admitting: Nurse Practitioner

## 2021-11-16 DIAGNOSIS — E039 Hypothyroidism, unspecified: Secondary | ICD-10-CM

## 2021-11-16 DIAGNOSIS — C189 Malignant neoplasm of colon, unspecified: Secondary | ICD-10-CM

## 2021-11-19 ENCOUNTER — Other Ambulatory Visit: Payer: Medicare Other

## 2021-11-19 ENCOUNTER — Ambulatory Visit: Payer: Medicare Other | Admitting: Nurse Practitioner

## 2021-11-19 ENCOUNTER — Encounter: Payer: Self-pay | Admitting: Oncology

## 2021-11-30 ENCOUNTER — Inpatient Hospital Stay (HOSPITAL_BASED_OUTPATIENT_CLINIC_OR_DEPARTMENT_OTHER): Payer: Medicare Other | Admitting: Nurse Practitioner

## 2021-11-30 ENCOUNTER — Encounter: Payer: Self-pay | Admitting: Nurse Practitioner

## 2021-11-30 ENCOUNTER — Inpatient Hospital Stay: Payer: Medicare Other | Attending: Oncology

## 2021-11-30 VITALS — BP 126/88 | HR 96 | Temp 97.8°F | Resp 18 | Ht 62.0 in | Wt 178.2 lb

## 2021-11-30 DIAGNOSIS — E039 Hypothyroidism, unspecified: Secondary | ICD-10-CM | POA: Diagnosis not present

## 2021-11-30 DIAGNOSIS — Z1509 Genetic susceptibility to other malignant neoplasm: Secondary | ICD-10-CM | POA: Diagnosis not present

## 2021-11-30 DIAGNOSIS — Z90710 Acquired absence of both cervix and uterus: Secondary | ICD-10-CM | POA: Insufficient documentation

## 2021-11-30 DIAGNOSIS — D509 Iron deficiency anemia, unspecified: Secondary | ICD-10-CM | POA: Insufficient documentation

## 2021-11-30 DIAGNOSIS — Z800 Family history of malignant neoplasm of digestive organs: Secondary | ICD-10-CM | POA: Insufficient documentation

## 2021-11-30 DIAGNOSIS — Z90721 Acquired absence of ovaries, unilateral: Secondary | ICD-10-CM | POA: Insufficient documentation

## 2021-11-30 DIAGNOSIS — Z85038 Personal history of other malignant neoplasm of large intestine: Secondary | ICD-10-CM | POA: Insufficient documentation

## 2021-11-30 DIAGNOSIS — C189 Malignant neoplasm of colon, unspecified: Secondary | ICD-10-CM

## 2021-11-30 DIAGNOSIS — Z9079 Acquired absence of other genital organ(s): Secondary | ICD-10-CM | POA: Insufficient documentation

## 2021-11-30 DIAGNOSIS — Z79899 Other long term (current) drug therapy: Secondary | ICD-10-CM | POA: Insufficient documentation

## 2021-11-30 LAB — CEA (ACCESS): CEA (CHCC): 3.11 ng/mL (ref 0.00–5.00)

## 2021-11-30 LAB — TSH: TSH: 1.017 u[IU]/mL (ref 0.350–4.500)

## 2021-11-30 NOTE — Progress Notes (Signed)
?[facility] ?OFFICE PROGRESS NOTE ? ? ?Diagnosis: Colon cancer ? ?INTERVAL HISTORY:  ? ?Ms. Leis returns as scheduled.  She underwent a total hysterectomy/left salpingo-oophorectomy 11/22/2021.  She feels she is recovering well.  Abdominal discomfort is improving.  No change in bowel habits.  She denies bleeding. ? ?Objective: ? ?Vital signs in last 24 hours: ? ?Blood pressure 126/88, pulse 96, temperature 97.8 ?F (36.6 ?C), temperature source Oral, resp. rate 18, height 5' 2"  (1.575 m), weight 178 lb 3.2 oz (80.8 kg), SpO2 100 %. ?  ?Lymphatics: No palpable cervical, supraclavicular, axillary or inguinal lymph nodes. ?Resp: Lungs clear bilaterally. ?Cardio: Regular rate and rhythm. ?GI: Abdomen is soft.  Tender at the lower abdomen.  Healed surgical incisions.  No hepatosplenomegaly. ?Vascular: No leg edema. ? ?Lab Results: ? ?Lab Results  ?Component Value Date  ? WBC 8.9 11/20/2020  ? HGB 14.0 11/20/2020  ? HCT 41.7 11/20/2020  ? MCV 92.1 11/20/2020  ? PLT 253 11/20/2020  ? NEUTROABS 7.1 11/20/2020  ? ? ?Imaging: ? ?No results found. ? ?Medications: I have reviewed the patient's current medications. ? ?Assessment/Plan: ?Stage IV Colon cancer, cecum, presenting with a palpable low abdominal/pelvic mass ?Colonoscopy 03/31/2017 confirmed a cecum mass, biopsy positive for poorly differentiated adenocarcinoma ?CT abdomen/pelvis 03/29/2017-large necrotic right iliac fossa mass, enlarged right common iliac nodes, loculated pelvic ascites, right lower quadrant mesenteric masses ?Right colectomy/right oophorectomy-salpingectomy 04/04/2017 ?Pathology from the right colon resection, grade 3 adenocarcinoma of the cecum, pT4b,pN2b ?Loss of MSH6 expression ?PET scan 04/29/2017-residual tumor at the left adnexa, and internal iliac lymph node, and aortocaval node ?Cycle 1 Pembrolizumab 05/13/2017 ?Cycle 2 Pembrolizumab 05/30/2017 ?Cycle 3 Pembrolizumab 06/20/2017 ?Cycle 4 Pembrolizumab 07/11/2017 ?Cycle 5  Pembrolizumab 08/04/2017 ?Cycle 6 pembrolizumab 08/22/2017 ?Cycle 7 pembrolizumab 09/12/2017 ?CTs 09/29/2017-previously noted retroperitoneal and right lower quadrant adenopathy has resolved, necrotic pelvic mass not visualized, single enhancing nodule at the umbilicus is nonspecific ?Cycle 8 pembrolizumab 10/03/2017 ?Cycle 9 Pembrolizumab 10/24/2017 ?Cycle 10 Pembrolizumab 11/14/2017 ?Cycle 11 pembrolizumab 12/05/2017 ?Cycle 12 pembrolizumab 12/26/2017 ?Cycle 13 Pembrolizumab 01/16/2018 ?Cycle 14 pembrolizumab 02/06/2018 ?Cycle 15 Pembrolizumab 02/27/2018 ?Cycle 16 Pembrolizumab 03/20/2018 ?CT 04/10/2018-no evidence of recurrent malignancy, stable subcutaneous lesion at the anterior abdominal wall ?Cycle 17 pembrolizumab 04/10/2018 ?Cycle 18 Pembrolizumab 05/01/2018 ?Cycle 19 Pembrolizumab 05/22/2018 ?Cycle 20 Pembrolizumab 06/12/2018 ?Cycle 21 pembrolizumab 07/03/2018 ?Cycle 22 Pembrolizumab 07/24/2018 ?Cycle 23 pembrolizumab 08/14/2018 ?Cycle 24 pembrolizumab 09/04/2018 ?Cycle 25 pembrolizumab 09/25/2018 ?Cycle 26 Pembrolizumab 10/16/2018 ?Cycle 27 pembrolizumab 11/06/2018 ?Cycle 28 pembrolizumab 12/18/2018 ?Cycle 29 Pembrolizumab 01/08/2019 ?Cycle 30 Pembrolizumab 01/29/2019 ?Cycle 31 pembrolizumab 02/19/2019 ?Cycle 32 pembrolizumab 03/12/2019 ?Cycle 33 pembrolizumab 04/02/2019 ?Cycle 34 pembrolizumab 04/23/2019 ?Cycle 35 pembrolizumab 05/14/2019 ?CTs 05/12/2019- no evidence of recurrent disease ?CTs 05/11/2020-no evidence of recurrent disease ?CTs 05/22/2021-1.9 cm left adrenal nodule slowly progressive over multiple studies increased from 1.4 cm on the prior exam and 1.0 cm on 04/09/2018. 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  Diffuse hepatic steatosis. ?  ?2.   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  ?3.   Anemia-likely iron deficiency anemia secondary to #1.  Resolved. ?  ?4.   G3 P3 ?  ?5.   Appendectomy ?  ?5.    Maternal aunt with colon  cancer ?  ?6.   Hypothyroid-likely secondary to toxicity from pembrolizumab, thyroid hormone replacement started 08/22/2017 ?  ?7.   Port-A-Cath placement 11/28/2017; Port-A-Cath removed 06/04/2019 ? ?8.  Total hysterectomy,  left salpingo oophorectomy 11/22/2021-pathology with weakly proliferative endometrium, subserosal cyst and lipoma, ovary with hemorrhagic corpus luteum cyst, salpingitis isthmica nodosa, cervix with no significant histopathological findings ?  ?  ? ?Disposition: Ms. Niznik remains in clinical remission from colon cancer.  We will follow-up on the CEA from today.  Plan for surveillance CTs in 6 months.  She will continue colonoscopy and upper endoscopy surveillance with Dr. Silverio Decamp. ? ?She will return for an office visit in 6 months.  We are available to see her sooner if needed. ? ?Patient seen with Dr. Benay Spice. ? ? ? ?Ned Card ANP/GNP-BC  ? ?11/30/2021  ?10:16 AM ? ?This was a shared visit with Ned Card.  Ms. Candise Che remains in clinical remission from colon cancer.  She will continue endoscopic surveillance with Dr. Silverio Decamp. ? ?She will return for an office visit and restaging CTs in 6 months. ? ?I was present for greater than 50% of today's visit.  I performed medical decision making. ? ?Julieanne Manson, MD ? ? ? ? ? ?

## 2022-03-25 ENCOUNTER — Other Ambulatory Visit: Payer: Self-pay

## 2022-05-31 ENCOUNTER — Inpatient Hospital Stay: Payer: Medicare Other | Attending: Oncology

## 2022-05-31 ENCOUNTER — Ambulatory Visit (HOSPITAL_BASED_OUTPATIENT_CLINIC_OR_DEPARTMENT_OTHER)
Admission: RE | Admit: 2022-05-31 | Discharge: 2022-05-31 | Disposition: A | Discharge status: Home / Self Care | Payer: Medicare Other | Source: Ambulatory Visit | Attending: Nurse Practitioner | Admitting: Nurse Practitioner

## 2022-05-31 ENCOUNTER — Encounter (HOSPITAL_BASED_OUTPATIENT_CLINIC_OR_DEPARTMENT_OTHER): Payer: Self-pay

## 2022-05-31 ENCOUNTER — Other Ambulatory Visit: Payer: Self-pay | Admitting: Gastroenterology

## 2022-05-31 DIAGNOSIS — C189 Malignant neoplasm of colon, unspecified: Secondary | ICD-10-CM | POA: Insufficient documentation

## 2022-05-31 DIAGNOSIS — Z79899 Other long term (current) drug therapy: Secondary | ICD-10-CM | POA: Insufficient documentation

## 2022-05-31 DIAGNOSIS — E039 Hypothyroidism, unspecified: Secondary | ICD-10-CM | POA: Diagnosis present

## 2022-05-31 DIAGNOSIS — Z85038 Personal history of other malignant neoplasm of large intestine: Secondary | ICD-10-CM | POA: Insufficient documentation

## 2022-05-31 LAB — TSH: TSH: 2.419 u[IU]/mL (ref 0.350–4.500)

## 2022-05-31 LAB — BASIC METABOLIC PANEL - CANCER CENTER ONLY
Anion gap: 11 (ref 5–15)
BUN: 10 mg/dL (ref 6–20)
CO2: 26 mmol/L (ref 22–32)
Calcium: 9.8 mg/dL (ref 8.9–10.3)
Chloride: 102 mmol/L (ref 98–111)
Creatinine: 0.83 mg/dL (ref 0.44–1.00)
GFR, Estimated: >60 mL/min (ref >60)
Glucose, Bld: 88 mg/dL (ref 70–99)
Potassium: 3.8 mmol/L (ref 3.5–5.1)
Sodium: 139 mmol/L (ref 135–145)

## 2022-05-31 LAB — CEA (ACCESS): CEA (CHCC): 4.05 ng/mL (ref 0.00–5.00)

## 2022-05-31 MED ORDER — IOHEXOL 300 MG/ML  SOLN
100.0000 mL | Freq: Once | INTRAMUSCULAR | Status: AC | PRN
  Administered 2022-05-31: 80 mL via INTRAVENOUS

## 2022-06-03 ENCOUNTER — Encounter: Payer: Self-pay | Admitting: Gastroenterology

## 2022-06-05 ENCOUNTER — Inpatient Hospital Stay: Payer: Medicare Other | Attending: Oncology | Admitting: Oncology

## 2022-06-05 ENCOUNTER — Other Ambulatory Visit: Payer: Self-pay | Admitting: *Deleted

## 2022-06-05 VITALS — BP 128/89 | HR 98 | Temp 98.1°F | Resp 18 | Ht 62.0 in | Wt 177.6 lb

## 2022-06-05 DIAGNOSIS — C189 Malignant neoplasm of colon, unspecified: Secondary | ICD-10-CM | POA: Diagnosis not present

## 2022-06-05 DIAGNOSIS — E039 Hypothyroidism, unspecified: Secondary | ICD-10-CM | POA: Insufficient documentation

## 2022-06-05 DIAGNOSIS — Z1509 Genetic susceptibility to other malignant neoplasm: Secondary | ICD-10-CM | POA: Insufficient documentation

## 2022-06-05 DIAGNOSIS — Z9221 Personal history of antineoplastic chemotherapy: Secondary | ICD-10-CM | POA: Diagnosis not present

## 2022-06-05 DIAGNOSIS — Z90710 Acquired absence of both cervix and uterus: Secondary | ICD-10-CM | POA: Insufficient documentation

## 2022-06-05 DIAGNOSIS — Z800 Family history of malignant neoplasm of digestive organs: Secondary | ICD-10-CM | POA: Insufficient documentation

## 2022-06-05 DIAGNOSIS — Z9079 Acquired absence of other genital organ(s): Secondary | ICD-10-CM | POA: Diagnosis not present

## 2022-06-05 DIAGNOSIS — Z79899 Other long term (current) drug therapy: Secondary | ICD-10-CM | POA: Diagnosis not present

## 2022-06-05 DIAGNOSIS — Z85038 Personal history of other malignant neoplasm of large intestine: Secondary | ICD-10-CM | POA: Diagnosis present

## 2022-06-05 DIAGNOSIS — Z90721 Acquired absence of ovaries, unilateral: Secondary | ICD-10-CM | POA: Insufficient documentation

## 2022-06-05 NOTE — Progress Notes (Signed)
[facility] OFFICE PROGRESS NOTE   Diagnosis: Colon cancer  INTERVAL HISTORY:   [name] returns as scheduled.  She feels well.  Good appetite.  She is working.  No new complaint.  She continues thyroid hormone replacement.  Objective:  Vital signs in last 24 hours:  Blood pressure 128/89, pulse 98, temperature 98.1 F (36.7 C), temperature source Oral, resp. rate 18, height _0  (1.575 m), weight 177 lb 9.6 oz (80.6 kg), last menstrual period 05/22/2021, SpO2 100 %.   Lymphatics: No cervical, supraclavicular, axillary, or inguinal nodes Resp: Lungs clear bilaterally Cardio: Regular rate and rhythm GI: Nontender, no mass, no hepatosplenomegaly Vascular: No leg edema   Lab Results:  Lab Results  Component Value Date   WBC 8.9 11/20/2020   HGB 14.0 11/20/2020   HCT 41.7 11/20/2020   MCV 92.1 11/20/2020   PLT 253 11/20/2020   NEUTROABS 7.1 11/20/2020    CMP  Lab Results  Component Value Date   NA 139 05/31/2022   K 3.8 05/31/2022   CL 102 05/31/2022   CO2 26 05/31/2022   GLUCOSE 88 05/31/2022   BUN 10 05/31/2022   CREATININE 0.83 05/31/2022   CALCIUM 9.8 05/31/2022   PROT 7.5 05/12/2019   ALBUMIN 4.1 05/12/2019   AST 19 05/12/2019   ALT 10 05/12/2019   ALKPHOS 62 05/12/2019   BILITOT 0.4 05/12/2019   GFRNONAA >60 05/31/2022   GFRAA >60 05/11/2020    Lab Results  Component Value Date   CEA1 4.28 05/22/2021   CEA 4.05 05/31/2022    Medications: I have reviewed the patient's current medications.   Assessment/Plan:  Stage IV Colon cancer, cecum, presenting with a palpable low abdominal/pelvic mass Colonoscopy 03/31/2017 confirmed a cecum mass, biopsy positive for poorly differentiated adenocarcinoma CT abdomen/pelvis 03/29/2017-large necrotic right iliac fossa mass, enlarged right common iliac nodes, loculated pelvic ascites, right lower quadrant mesenteric masses Right colectomy/right oophorectomy-salpingectomy  04/04/2017 Pathology from the right colon resection, grade 3 adenocarcinoma of the cecum, pT4b,pN2b Loss of MSH6 expression PET scan 04/29/2017-residual tumor at the left adnexa, and internal iliac lymph node, and aortocaval node Cycle 1 Pembrolizumab 05/13/2017 Cycle 2 Pembrolizumab 05/30/2017 Cycle 3 Pembrolizumab 06/20/2017 Cycle 4 Pembrolizumab 07/11/2017 Cycle 5 Pembrolizumab 08/04/2017 Cycle 6 pembrolizumab 08/22/2017 Cycle 7 pembrolizumab 09/12/2017 CTs 09/29/2017-previously noted retroperitoneal and right lower quadrant adenopathy has resolved, necrotic pelvic mass not visualized, single enhancing nodule at the umbilicus is nonspecific Cycle 8 pembrolizumab 10/03/2017 Cycle 9 Pembrolizumab 10/24/2017 Cycle 10 Pembrolizumab 11/14/2017 Cycle 11 pembrolizumab 12/05/2017 Cycle 12 pembrolizumab 12/26/2017 Cycle 13 Pembrolizumab 01/16/2018 Cycle 14 pembrolizumab 02/06/2018 Cycle 15 Pembrolizumab 02/27/2018 Cycle 16 Pembrolizumab 03/20/2018 CT 04/10/2018-no evidence of recurrent malignancy, stable subcutaneous lesion at the anterior abdominal wall Cycle 17 pembrolizumab 04/10/2018 Cycle 18 Pembrolizumab 05/01/2018 Cycle 19 Pembrolizumab 05/22/2018 Cycle 20 Pembrolizumab 06/12/2018 Cycle 21 pembrolizumab 07/03/2018 Cycle 22 Pembrolizumab 07/24/2018 Cycle 23 pembrolizumab 08/14/2018 Cycle 24 pembrolizumab 09/04/2018 Cycle 25 pembrolizumab 09/25/2018 Cycle 26 Pembrolizumab 10/16/2018 Cycle 27 pembrolizumab 11/06/2018 Cycle 28 pembrolizumab 12/18/2018 Cycle 29 Pembrolizumab 01/08/2019 Cycle 30 Pembrolizumab 01/29/2019 Cycle 31 pembrolizumab 02/19/2019 Cycle 32 pembrolizumab 03/12/2019 Cycle 33 pembrolizumab 04/02/2019 Cycle 34 pembrolizumab 04/23/2019 Cycle 35 pembrolizumab 05/14/2019 CTs 05/12/2019- no evidence of recurrent disease CTs 05/11/2020-no evidence of recurrent disease CTs 05/22/2021-1.9 cm left adrenal nodule slowly progressive over multiple studies increased from 1.4 cm on the prior exam and 1.0 cm on  04/09/2018. 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  Diffuse  hepatic steatosis. 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   Anemia-likely iron deficiency anemia secondary to #1.  Resolved.   4.   G3 P3   5.   Appendectomy   5.    Maternal aunt with colon cancer   6.   Hypothyroid-likely secondary to toxicity from pembrolizumab, thyroid hormone replacement started 08/22/2017   7.   Port-A-Cath placement 11/28/2017; Port-A-Cath removed 06/04/2019  8.  Total hysterectomy, left salpingo oophorectomy 11/22/2021-pathology with weakly proliferative endometrium, subserosal cyst and lipoma, ovary with hemorrhagic corpus luteum cyst, salpingitis isthmica nodosa, cervix with no significant histopathological findings      Disposition: [name] remains in clinical remission from colon cancer.  She will return for an office visit and CEA in 6 months.  She is due for a surveillance colonoscopy with Dr. Silverio Decamp.  She continues thyroid hormone replacement.  The TSH was normal last week.  Betsy Coder, MD  06/05/2022  9:36 AM

## 2022-06-05 NOTE — Progress Notes (Signed)
Changed GI order for endoscopy suite due to need for colonoscopy.

## 2022-06-24 ENCOUNTER — Other Ambulatory Visit: Payer: Self-pay | Admitting: Oncology

## 2022-06-24 ENCOUNTER — Other Ambulatory Visit: Payer: Self-pay | Admitting: Gastroenterology

## 2022-06-24 DIAGNOSIS — C189 Malignant neoplasm of colon, unspecified: Secondary | ICD-10-CM

## 2022-06-24 DIAGNOSIS — E039 Hypothyroidism, unspecified: Secondary | ICD-10-CM

## 2022-08-15 ENCOUNTER — Other Ambulatory Visit: Payer: Self-pay | Admitting: Gastroenterology

## 2022-10-07 ENCOUNTER — Other Ambulatory Visit: Payer: Self-pay | Admitting: Gastroenterology

## 2022-10-18 ENCOUNTER — Encounter: Payer: Self-pay | Admitting: Gastroenterology

## 2022-12-05 ENCOUNTER — Encounter: Payer: Self-pay | Admitting: Oncology

## 2022-12-12 ENCOUNTER — Encounter: Payer: Self-pay | Admitting: Nurse Practitioner

## 2022-12-12 ENCOUNTER — Inpatient Hospital Stay: Payer: Medicaid Other | Attending: Nurse Practitioner | Admitting: Nurse Practitioner

## 2022-12-12 ENCOUNTER — Telehealth: Payer: Self-pay | Admitting: Gastroenterology

## 2022-12-12 ENCOUNTER — Inpatient Hospital Stay: Payer: Medicaid Other

## 2022-12-12 VITALS — BP 125/78 | HR 91 | Temp 98.2°F | Resp 18 | Ht 62.0 in | Wt 184.2 lb

## 2022-12-12 DIAGNOSIS — Z85038 Personal history of other malignant neoplasm of large intestine: Secondary | ICD-10-CM | POA: Insufficient documentation

## 2022-12-12 DIAGNOSIS — C189 Malignant neoplasm of colon, unspecified: Secondary | ICD-10-CM | POA: Diagnosis not present

## 2022-12-12 DIAGNOSIS — Z79890 Hormone replacement therapy (postmenopausal): Secondary | ICD-10-CM | POA: Insufficient documentation

## 2022-12-12 DIAGNOSIS — Z1509 Genetic susceptibility to other malignant neoplasm: Secondary | ICD-10-CM | POA: Insufficient documentation

## 2022-12-12 DIAGNOSIS — Z9221 Personal history of antineoplastic chemotherapy: Secondary | ICD-10-CM | POA: Diagnosis not present

## 2022-12-12 DIAGNOSIS — Z800 Family history of malignant neoplasm of digestive organs: Secondary | ICD-10-CM | POA: Diagnosis not present

## 2022-12-12 LAB — CEA (ACCESS): CEA (CHCC): 3.98 ng/mL (ref 0.00–5.00)

## 2022-12-12 LAB — TSH: TSH: 2.341 u[IU]/mL (ref 0.350–4.500)

## 2022-12-12 NOTE — Telephone Encounter (Signed)
Inbound call from patient she want to speak with a nurse in regard a Endoscopy she want to get done at the same time with colon .Please advise

## 2022-12-12 NOTE — Progress Notes (Signed)
[facility] OFFICE PROGRESS NOTE   Diagnosis: Colon cancer  INTERVAL HISTORY:   Meagan [name] returns as scheduled.  She feels well.  No change in bowel habits.  No bleeding.  No weight loss.  She has a good appetite.  She had to reschedule her surveillance colonoscopy due to a conflict with her children's schedule.  Objective:  Vital signs in last 24 hours:  Blood pressure 125/78, pulse 91, temperature 98.2 F (36.8 C), temperature source Oral, resp. rate 18, height 5\' 2"  (1.575 m), weight 184 lb 3.2 oz (83.6 kg), last menstrual period 05/22/2021, SpO2 97 %.    Lymphatics: No palpable cervical, supraclavicular, axillary or inguinal lymph nodes. Resp: Lungs clear bilaterally. Cardio: Regular rate and rhythm. GI: Abdomen soft and nontender.  No hepatosplenomegaly.  No mass. Vascular: No leg edema.   Lab Results:  Lab Results  Component Value Date   WBC 8.9 11/20/2020   HGB 14.0 11/20/2020   HCT 41.7 11/20/2020   MCV 92.1 11/20/2020   PLT 253 11/20/2020   NEUTROABS 7.1 11/20/2020    Imaging:  No results found.  Medications: I have reviewed the patient's current medications.  Assessment/Plan: Stage IV Colon cancer, cecum, presenting with a palpable low abdominal/pelvic mass Colonoscopy 03/31/2017 confirmed a cecum mass, biopsy positive for poorly differentiated adenocarcinoma CT abdomen/pelvis 03/29/2017-large necrotic right iliac fossa mass, enlarged right common iliac nodes, loculated pelvic ascites, right lower quadrant mesenteric masses Right colectomy/right oophorectomy-salpingectomy 04/04/2017 Pathology from the right colon resection, grade 3 adenocarcinoma of the cecum, pT4b,pN2b Loss of MSH6 expression PET scan 04/29/2017-residual tumor at the left adnexa, and internal iliac lymph node, and aortocaval node Cycle 1 Pembrolizumab 05/13/2017 Cycle 2 Pembrolizumab 05/30/2017 Cycle 3 Pembrolizumab 06/20/2017 Cycle 4 Pembrolizumab 07/11/2017 Cycle  5 Pembrolizumab 08/04/2017 Cycle 6 pembrolizumab 08/22/2017 Cycle 7 pembrolizumab 09/12/2017 CTs 09/29/2017-previously noted retroperitoneal and right lower quadrant adenopathy has resolved, necrotic pelvic mass not visualized, single enhancing nodule at the umbilicus is nonspecific Cycle 8 pembrolizumab 10/03/2017 Cycle 9 Pembrolizumab 10/24/2017 Cycle 10 Pembrolizumab 11/14/2017 Cycle 11 pembrolizumab 12/05/2017 Cycle 12 pembrolizumab 12/26/2017 Cycle 13 Pembrolizumab 01/16/2018 Cycle 14 pembrolizumab 02/06/2018 Cycle 15 Pembrolizumab 02/27/2018 Cycle 16 Pembrolizumab 03/20/2018 CT 04/10/2018-no evidence of recurrent malignancy, stable subcutaneous lesion at the anterior abdominal wall Cycle 17 pembrolizumab 04/10/2018 Cycle 18 Pembrolizumab 05/01/2018 Cycle 19 Pembrolizumab 05/22/2018 Cycle 20 Pembrolizumab 06/12/2018 Cycle 21 pembrolizumab 07/03/2018 Cycle 22 Pembrolizumab 07/24/2018 Cycle 23 pembrolizumab 08/14/2018 Cycle 24 pembrolizumab 09/04/2018 Cycle 25 pembrolizumab 09/25/2018 Cycle 26 Pembrolizumab 10/16/2018 Cycle 27 pembrolizumab 11/06/2018 Cycle 28 pembrolizumab 12/18/2018 Cycle 29 Pembrolizumab 01/08/2019 Cycle 30 Pembrolizumab 01/29/2019 Cycle 31 pembrolizumab 02/19/2019 Cycle 32 pembrolizumab 03/12/2019 Cycle 33 pembrolizumab 04/02/2019 Cycle 34 pembrolizumab 04/23/2019 Cycle 35 pembrolizumab 05/14/2019 CTs 05/12/2019- no evidence of recurrent disease CTs 05/11/2020-no evidence of recurrent disease CTs 05/22/2021-1.9 cm left adrenal nodule slowly progressive over multiple studies increased from 1.4 cm on the prior exam and 1.0 cm on 04/09/2018. 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  Diffuse hepatic steatosis. 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   Lynch syndrome. She is  positive for a pathogenic mutation in MSH6    3.   Anemia-likely iron deficiency anemia secondary to #1.  Resolved.   4.   G3 P3   5.   Appendectomy   5.    Maternal aunt with colon cancer  6.   Hypothyroid-likely secondary to toxicity from pembrolizumab, thyroid hormone replacement started 08/22/2017   7.   Port-A-Cath placement 11/28/2017; Port-A-Cath removed 06/04/2019   8.  Total hysterectomy, left salpingo oophorectomy 11/22/2021-pathology with weakly proliferative endometrium, subserosal cyst and lipoma, ovary with hemorrhagic corpus luteum cyst, salpingitis isthmica nodosa, cervix with no significant histopathological findings    Disposition: Meagan [name] remains in clinical remission from colon cancer.  We will follow-up on the CEA from today.  She is overdue for a surveillance colonoscopy with Dr. Lavon Paganini.  She plans to schedule the colonoscopy in June.  Our plan is for CTs in 6 months.  She continues thyroid hormone replacement.  We will follow-up on the TSH.  She will return for lab and CTs in 6 months.  We will see her in follow-up a few days later to review results.    Lonna Cobb ANP/GNP-BC   12/12/2022  9:16 AM

## 2022-12-13 ENCOUNTER — Telehealth: Payer: Self-pay

## 2022-12-13 LAB — T4: T4, Total: 8.1 ug/dL (ref 4.5–12.0)

## 2022-12-13 NOTE — Telephone Encounter (Signed)
Patient gave verbal understanding and had no further questions or concerns at this time 

## 2022-12-13 NOTE — Telephone Encounter (Signed)
-----   Message from Rana Snare, NP sent at 12/12/2022  5:35 PM EDT ----- Please let her know CEA is stable in normal range.  Follow-up as scheduled.

## 2022-12-16 ENCOUNTER — Encounter: Payer: Medicare Other | Admitting: Gastroenterology

## 2022-12-17 NOTE — Telephone Encounter (Signed)
Patient with Lynch syndrome. Her oncologist informed her she was about due to the EGD. Patient would prefer to get these done together. Please advise. Thanks

## 2022-12-18 ENCOUNTER — Encounter: Payer: Self-pay | Admitting: Gastroenterology

## 2022-12-18 NOTE — Telephone Encounter (Signed)
Agree with EGD and colonoscopy surveillance exam for Lynch syndrome. Thanks

## 2022-12-18 NOTE — Telephone Encounter (Signed)
Patient needs to be contacted at 4:30 pm due to her work, please.

## 2023-01-20 ENCOUNTER — Encounter: Payer: Self-pay | Admitting: Oncology

## 2023-02-10 ENCOUNTER — Ambulatory Visit (AMBULATORY_SURGERY_CENTER): Payer: Self-pay

## 2023-02-10 ENCOUNTER — Telehealth: Payer: Self-pay

## 2023-02-10 VITALS — Ht 62.0 in | Wt 183.0 lb

## 2023-02-10 DIAGNOSIS — Z85038 Personal history of other malignant neoplasm of large intestine: Secondary | ICD-10-CM

## 2023-02-10 DIAGNOSIS — Z1509 Genetic susceptibility to other malignant neoplasm: Secondary | ICD-10-CM

## 2023-02-10 NOTE — Progress Notes (Signed)

## 2023-02-11 MED ORDER — ONDANSETRON HCL 4 MG PO TABS: 4.0000 mg | ORAL_TABLET | Freq: Three times a day (TID) | ORAL | 0 refills | Status: DC | PRN

## 2023-02-11 NOTE — Telephone Encounter (Signed)
Sent Zofran into pharmacy per Dr. Johnnette Barrios.  New instructions for Miralax prep created and sent in My Chart and will go out in Mail Room today.

## 2023-02-11 NOTE — Telephone Encounter (Signed)
Sure it is fine to use MiraLAX bowel prep.  Please send in prescription for Zofran 4 mg ODT every 8 as needed X6 tabs.  Thanks

## 2023-02-27 ENCOUNTER — Encounter: Payer: Self-pay | Admitting: Oncology

## 2023-03-10 ENCOUNTER — Encounter: Payer: Self-pay | Admitting: Gastroenterology

## 2023-03-10 ENCOUNTER — Ambulatory Visit (AMBULATORY_SURGERY_CENTER): Payer: Medicaid Other | Admitting: Gastroenterology

## 2023-03-10 VITALS — BP 105/69 | HR 82 | Temp 98.2°F | Resp 13 | Ht 62.0 in | Wt 183.0 lb

## 2023-03-10 DIAGNOSIS — Z85038 Personal history of other malignant neoplasm of large intestine: Secondary | ICD-10-CM

## 2023-03-10 DIAGNOSIS — K2900 Acute gastritis without bleeding: Secondary | ICD-10-CM | POA: Diagnosis not present

## 2023-03-10 DIAGNOSIS — K2211 Ulcer of esophagus with bleeding: Secondary | ICD-10-CM

## 2023-03-10 DIAGNOSIS — K2100 Gastro-esophageal reflux disease with esophagitis, without bleeding: Secondary | ICD-10-CM | POA: Diagnosis not present

## 2023-03-10 DIAGNOSIS — Z08 Encounter for follow-up examination after completed treatment for malignant neoplasm: Secondary | ICD-10-CM | POA: Diagnosis not present

## 2023-03-10 DIAGNOSIS — K319 Disease of stomach and duodenum, unspecified: Secondary | ICD-10-CM | POA: Diagnosis not present

## 2023-03-10 DIAGNOSIS — Z1509 Genetic susceptibility to other malignant neoplasm: Secondary | ICD-10-CM

## 2023-03-10 MED ORDER — SUCRALFATE 1 GM/10ML PO SUSP: 1.0000 g | Freq: Two times a day (BID) | ORAL | 0 refills | Status: DC

## 2023-03-10 MED ORDER — SODIUM CHLORIDE 0.9 % IV SOLN: 500.0000 mL | Freq: Once | INTRAVENOUS | Status: DC

## 2023-03-10 MED ORDER — PANTOPRAZOLE SODIUM 40 MG PO TBEC: 40.0000 mg | DELAYED_RELEASE_TABLET | Freq: Two times a day (BID) | ORAL | 2 refills | Status: DC

## 2023-03-10 NOTE — Patient Instructions (Addendum)
Handout provided on gastritis, esophagitis and hemorrhoids.  Follow an antireflux regimen (see handout).  Resume previous diet.  Continue present medications.  Use Protonix (pantoprazole) 40mg  by mouth twice daily for 3 months.  Use Sucralfate suspension 1 gram by mouth twice daily for 2 weeks.  Await pathology results.  Repeat colonoscopy in 1 year for surveillance.  Repeat upper endoscopy in 3 months to check healing.   YOU HAD AN ENDOSCOPIC PROCEDURE TODAY AT THE [facility]:   Refer to the procedure report that was given to you for any specific questions about what was found during the examination.  If the procedure report does not answer your questions, please call your gastroenterologist to clarify.  If you requested that your care partner not be given the details of your procedure findings, then the procedure report has been included in a sealed envelope for you to review at your convenience later.  YOU SHOULD EXPECT: Some feelings of bloating in the abdomen. Passage of more gas than usual.  Walking can help get rid of the air that was put into your GI tract during the procedure and reduce the bloating. If you had a lower endoscopy (such as a colonoscopy or flexible sigmoidoscopy) you may notice spotting of blood in your stool or on the toilet paper. If you underwent a bowel prep for your procedure, you may not have a normal bowel movement for a few days.  Please Note:  You might notice some irritation and congestion in your nose or some drainage.  This is from the oxygen used during your procedure.  There is no need for concern and it should clear up in a day or so.  SYMPTOMS TO REPORT IMMEDIATELY:  Following lower endoscopy (colonoscopy or flexible sigmoidoscopy):  Excessive amounts of blood in the stool  Significant tenderness or worsening of abdominal pains  Swelling of the abdomen that is new, acute  Fever of 100F or higher  Following upper endoscopy (EGD)  Vomiting  of blood or coffee ground material  New chest pain or pain under the shoulder blades  Painful or persistently difficult swallowing  New shortness of breath  Fever of 100F or higher  Black, tarry-looking stools  For urgent or emergent issues, a gastroenterologist can be reached at any hour by calling [phone]. Do not use MyChart messaging for urgent concerns.    DIET:  We do recommend a small meal at first, but then you may proceed to your regular diet.  Drink plenty of fluids but you should avoid alcoholic beverages for 24 hours.  ACTIVITY:  You should plan to take it easy for the rest of today and you should NOT DRIVE or use heavy machinery until tomorrow (because of the sedation medicines used during the test).    FOLLOW UP: Our staff will call the number listed on your records the next business day following your procedure.  We will call around 7:15- 8:00 am to check on you and address any questions or concerns that you may have regarding the information given to you following your procedure. If we do not reach you, we will leave a message.     If any biopsies were taken you will be contacted by phone or by letter within the next 1-3 weeks.  Please call us at [phone] if you have not heard about the biopsies in 3 weeks.    SIGNATURES/CONFIDENTIALITY: You and/or your care partner have signed paperwork which will be entered into your electronic medical  record.  These signatures attest to the fact that that the information above on your After Visit Summary has been reviewed and is understood.  Full responsibility of the confidentiality of this discharge information lies with you and/or your care-partner.

## 2023-03-10 NOTE — Addendum Note (Signed)
Addended by: Corbin Ade L on: 03/10/2023 03:51 PM   Modules accepted: Orders

## 2023-03-10 NOTE — Progress Notes (Signed)
[facility] History and Physical   Primary Care Physician:  Patient, No Pcp Per   Reason for Procedure:  History of adenomatous colon polyps, colon cancer and lynch syndrome  Plan:    EGD and surveillance colonoscopy with possible interventions as needed     HPI: Meagan [name] is a very pleasant 37 y.o. female here for surveillance EGD and colonoscopy. Denies any nausea, vomiting, abdominal pain, melena or bright red blood per rectum  The risks and benefits as well as alternatives of endoscopic procedure(s) have been discussed and reviewed. All questions answered. The patient agrees to proceed.    Past Medical History:  Diagnosis Date   adenocarcinoma of colon dx'd 04/2017   immunotherapy   Allergy    per pt   Anxiety    situational   04-30-2021 no issues per pt   Fallopian tube abscess    June 2018 right side   Genetic testing 05/07/2017   Meagan [name] underwent genetic counseling and testing for hereditary cancer syndromes on 04/18/2017. 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 Mutations in MSH6 are associated with a hereditary cancer syndrome called Lynch syndrome. For more detailed discussion, please see genetic counseling documentation from 05/07/2017.   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 Mutations in MSH6 are associated with a hereditary cancer syndrome called Lynch syndrome. For more detailed discussion, please see genetic counseling documentation from 05/07/2017.   Testing was perform   Ovarian cyst    Thyroid disease    per pt from immune therapy for cancer.    Past Surgical History:  Procedure Laterality Date   APPENDECTOMY   2010   COLON RESECTION Right 04/04/2017   Procedure: OPEN RIGHT COLECTOMY AND RIGHT SALPINGO-OOPHORECTOMY;  Surgeon: Darnell Level, MD;  Location: [facility];  Service: General;  Laterality: Right;   COLONOSCOPY N/A 03/31/2017   Procedure: COLONOSCOPY;  Surgeon: Napoleon Form, MD;  Location: [facility];  Service: Endoscopy;  Laterality: N/A;   COLONOSCOPY  2018   CYSTOSCOPY WITH STENT PLACEMENT Bilateral 04/04/2017   Procedure: CYSTOSCOPY WITH STENT PLACEMENT;  Surgeon: Sebastian Ache, MD;  Location: [facility];  Service: Urology;  Laterality: Bilateral;   IR FLUORO GUIDE PORT INSERTION RIGHT  11/28/2017   IR REMOVAL TUN ACCESS W/ PORT W/O FL MOD SED  06/04/2019   IR US GUIDE VASC ACCESS RIGHT  11/28/2017   UPPER GASTROINTESTINAL ENDOSCOPY  2018    Prior to Admission medications   Medication Sig Start Date End Date Taking? Authorizing Provider  levothyroxine (SYNTHROID) 88 MCG tablet TAKE ONE TABLET ( ) BY MOUTH DAILY BEFORE BREAKFAST 06/24/22  Yes Ladene Artist, MD  ondansetron (ZOFRAN) 4 MG tablet Take 1 tablet (4 mg total) by mouth every 8 (eight) hours as needed for nausea or vomiting. Take one Zofran 4 mg tablet 30-60 minutes before each colonoscopy prep dose 02/11/23  Yes Devonna Oboyle, Eleonore Chiquito, MD  aspirin-acetaminophen-caffeine (EXCEDRIN MIGRAINE) [phone] MG tablet Take by mouth every 6 (six) hours as needed for headache.    [provider]  estradiol (ESTRACE) 1 MG tablet Take 1 mg by mouth daily. 11/22/21   [provider]  pantoprazole (PROTONIX) 40 MG tablet TAKE ONE TABLET (40MG  TOTAL) BY MOUTH DAILY 10/07/22   William Schake,  Eleonore Chiquito, MD    Current Outpatient Medications  Medication Sig Dispense Refill   levothyroxine (SYNTHROID) 88 MCG tablet TAKE ONE TABLET ( ) BY MOUTH DAILY BEFORE BREAKFAST 30 tablet 6   ondansetron (ZOFRAN) 4 MG tablet Take 1 tablet (4 mg total) by mouth every 8 (eight) hours as needed for nausea or vomiting. Take one Zofran 4 mg tablet 30-60  minutes before each colonoscopy prep dose 6 tablet 0   aspirin-acetaminophen-caffeine (EXCEDRIN MIGRAINE) 250-250-65 MG tablet Take by mouth every 6 (six) hours as needed for headache.     estradiol (ESTRACE) 1 MG tablet Take 1 mg by mouth daily.     pantoprazole (PROTONIX) 40 MG tablet TAKE ONE TABLET (40MG  TOTAL) BY MOUTH DAILY 90 tablet 0   Current Facility-Administered Medications  Medication Dose Route Frequency Provider Last Rate Last Admin   0.9 %  sodium chloride infusion  500 mL Intravenous Continuous Alexandra Posadas V, MD       0.9 %  sodium chloride infusion  500 mL Intravenous Once Napoleon Form, MD        Allergies as of 03/10/2023   (No Known Allergies)    Family History  Problem Relation Age of Onset   Leukemia Father 70       "Just got diagnosed, but not the 'serious type'" (sounds like CLL if I had to guess)   Colon cancer Maternal Uncle    Colon cancer Maternal Grandmother        d.60s/70s diagnosed with colon cancer after age 1   Colon polyps Paternal Grandmother    Esophageal cancer Neg Hx    Rectal cancer Neg Hx    Stomach cancer Neg Hx     Social History   Socioeconomic History   Marital status: Divorced    Spouse name: Not on file   Number of children: Not on file   Years of education: Not on file   Highest education level: Not on file  Occupational History   Not on file  Tobacco Use   Smoking status: Every Day    Packs/day: 0.20    Years: 0.50    Additional pack years: 0.00    Total pack years: 0.10    Types: Cigarettes   Smokeless tobacco: Never  Vaping Use   Vaping Use: Never used  Substance and Sexual Activity   Alcohol use: No   Drug use: No   Sexual activity: Yes  Other Topics Concern   Not on file  Social History Narrative   Not on file   Social Determinants of Health   Financial Resource Strain: Not on file  Food Insecurity: Not on file  Transportation Needs: Not on file  Physical Activity: Not on file  Stress: Not  on file  Social Connections: Not on file  Intimate Partner Violence: Not on file    Review of Systems:  All other review of systems negative except as mentioned in the HPI.  Physical Exam: Vital signs in last 24 hours: Blood Pressure 103/76   Pulse 92   Temperature 98.2 F (36.8 C) (Temporal)   Respiration 18   Height 5\' 2"  (1.575 m)   Weight 183 lb (83 kg)   Last Menstrual Period 05/22/2021 Comment: irregular  Oxygen Saturation 100%   Body Mass Index 33.47 kg/m  General:   Alert, NAD Lungs:  Clear .   Heart:  Regular rate and rhythm Abdomen:  Soft, nontender and nondistended. Neuro/Psych:  Alert and cooperative. Normal mood and affect.  A and O x 3  Reviewed labs, radiology imaging, old records and pertinent past GI work up  Patient is appropriate for planned procedure(s) and anesthesia in an ambulatory setting   K. Scherry Ran , MD [phone]

## 2023-03-10 NOTE — Progress Notes (Signed)
Pt resting comfortably. VSS. Airway intact. SBAR complete to RN. All questions answered.   

## 2023-03-10 NOTE — Progress Notes (Signed)
EGD scheduled. Prep instructions printed and reviewed with pt.

## 2023-03-10 NOTE — Op Note (Signed)
[facility] Patient Name: Meagan [name] Procedure Date: 03/10/2023 1:52 PM MRN: [id number] Endoscopist: Napoleon Form , MD, [id number] Age: 37 Referring MD:  Date of Birth: [birth year] Gender: Female Account #: [id number] Procedure:                Colonoscopy Indications:              High risk colon cancer surveillance: Personal                            history of adenoma (10 mm or greater in size), High                            risk colon cancer surveillance: Personal history of                            colon cancer, Lynch Syndrome Medicines:                Monitored Anesthesia Care Procedure:                Pre-Anesthesia Assessment:                           - Prior to the procedure, a History and Physical                            was performed, and patient medications and                            allergies were reviewed. The patient's tolerance of                            previous anesthesia was also reviewed. The risks                            and benefits of the procedure and the sedation                            options and risks were discussed with the patient.                            All questions were answered, and informed consent                            was obtained. Prior Anticoagulants: The patient has                            taken no anticoagulant or antiplatelet agents. ASA                            Grade Assessment: II - A patient with mild systemic                            disease. After reviewing the risks and benefits,  the patient was deemed in satisfactory condition to                            undergo the procedure.                           After obtaining informed consent, the colonoscope                            was passed under direct vision. Throughout the                            procedure, the patient's blood pressure, pulse, and                            oxygen saturations were  monitored continuously. The                            Olympus Scope SN: [id number] was introduced through                            the anus and advanced to the the ileocolonic                            anastomosis. The colonoscopy was performed without                            difficulty. The patient tolerated the procedure                            well. The quality of the bowel preparation was                            good. Ileocolonic anastomosis and rectum were                            photographed. Scope In: 2:13:01 PM Scope Out: 2:23:21 PM Scope Withdrawal Time: 0 hours 6 minutes 42 seconds  Total Procedure Duration: 0 hours 10 minutes 20 seconds  Findings:                 The perianal and digital rectal examinations were                            normal.                           There was evidence of a prior end-to-side                            ileo-colonic anastomosis in the transverse colon.                            This was patent and was characterized by healthy  appearing mucosa. The anastomosis was traversed.                           Non-bleeding external and internal hemorrhoids were                            found during retroflexion. The hemorrhoids were                            small.                           The exam was otherwise without abnormality. Complications:            No immediate complications. Estimated Blood Loss:     Estimated blood loss was minimal. Impression:               - Patent end-to-side ileo-colonic anastomosis,                            characterized by healthy appearing mucosa.                           - Non-bleeding external and internal hemorrhoids.                           - The examination was otherwise normal.                           - No specimens collected. Recommendation:           - Resume previous diet.                           - Continue present medications.                            - Repeat colonoscopy in 1 year for surveillance. Napoleon Form, MD 03/10/2023 2:35:22 PM This report has been signed electronically.

## 2023-03-10 NOTE — Progress Notes (Signed)
Vitals-CW  Pt's states no medical or surgical changes since previsit or office visit. 

## 2023-03-10 NOTE — Op Note (Signed)
[facility] Patient Name: Meagan [name] Procedure Date: 03/10/2023 1:54 PM MRN: [id number] Endoscopist: Napoleon Form , MD, [id number] Age: 37 Referring MD:  Date of Birth: Nov 18, 1985 Gender: Female Account #: [id number] Procedure:                Upper GI endoscopy Indications:              Esophageal reflux, Hereditary nonpolyposis                            colorectal cancer (Lynch Syndrome) Medicines:                Monitored Anesthesia Care Procedure:                Pre-Anesthesia Assessment:                           - Prior to the procedure, a History and Physical                            was performed, and patient medications and                            allergies were reviewed. The patient's tolerance of                            previous anesthesia was also reviewed. The risks                            and benefits of the procedure and the sedation                            options and risks were discussed with the patient.                            All questions were answered, and informed consent                            was obtained. Prior Anticoagulants: The patient has                            taken no anticoagulant or antiplatelet agents. ASA                            Grade Assessment: II - A patient with mild systemic                            disease. After reviewing the risks and benefits,                            the patient was deemed in satisfactory condition to                            undergo the procedure.  After obtaining informed consent, the endoscope was                            passed under direct vision. Throughout the                            procedure, the patient's blood pressure, pulse, and                            oxygen saturations were monitored continuously. The                            GIF [id number] #[id number] was introduced through the                            mouth, and advanced to  the second part of duodenum.                            The upper GI endoscopy was accomplished without                            difficulty. The patient tolerated the procedure                            well. Scope In: Scope Out: Findings:                 LA Grade C (one or more mucosal breaks continuous                            between tops of 2 or more mucosal folds, less than                            75% circumference) esophagitis with no bleeding and                            nodularity was found 36 to 38 cm from the incisors.                            Biopsies were taken with a cold forceps for                            histology.                           Patchy mild inflammation characterized by                            congestion (edema) and erythema was found in the                            entire examined stomach. Biopsies were taken with a  cold forceps for Helicobacter pylori testing.                           The cardia and gastric fundus were normal on                            retroflexion.                           The examined duodenum was normal. Complications:            No immediate complications. Estimated Blood Loss:     Estimated blood loss was minimal. Impression:               - LA Grade C reflux esophagitis with no bleeding.                            Biopsied.                           - Gastritis. Biopsied.                           - Normal examined duodenum. Recommendation:           - Patient has a contact number available for                            emergencies. The signs and symptoms of potential                            delayed complications were discussed with the                            patient. Return to normal activities tomorrow.                            Written discharge instructions were provided to the                            patient.                           - Resume previous diet.                            - Continue present medications.                           - Await pathology results.                           - Repeat upper endoscopy in 3 months to check                            healing.                           - Follow  an antireflux regimen.                           - Use Protonix (pantoprazole) 40 mg PO BID X 3                            months.                           - Use sucralfate suspension 1 gram PO BID for 2                            weeks. Napoleon Form, MD 03/10/2023 2:38:25 PM This report has been signed electronically.

## 2023-03-10 NOTE — Progress Notes (Signed)
Called to room to assist during endoscopic procedure.  Patient ID and intended procedure confirmed with present staff. Received instructions for my participation in the procedure from the performing physician.  

## 2023-03-11 ENCOUNTER — Telehealth: Payer: Self-pay | Admitting: *Deleted

## 2023-03-11 NOTE — Telephone Encounter (Signed)
  Follow up Call-     03/10/2023    1:04 PM 03/10/2023    1:00 PM 05/14/2021   10:23 AM 07/13/2020    9:53 AM  Call back number  Post procedure Call Back phone  # [phone]  [phone]  Permission to leave phone message  Yes Yes Yes     Patient questions:  Do you have a fever, pain , or abdominal swelling? No. Pain Score  0 *  Have you tolerated food without any problems? Yes.    Have you been able to return to your normal activities? Yes.    Do you have any questions about your discharge instructions: Diet   No. Medications  No. Follow up visit  No.  Do you have questions or concerns about your Care? No.  Actions: * If pain score is 4 or above: No action needed, pain <4.

## 2023-04-03 ENCOUNTER — Encounter: Payer: Self-pay | Admitting: Gastroenterology

## 2023-06-13 ENCOUNTER — Ambulatory Visit (HOSPITAL_BASED_OUTPATIENT_CLINIC_OR_DEPARTMENT_OTHER)
Admission: RE | Admit: 2023-06-13 | Discharge: 2023-06-13 | Disposition: A | Discharge status: Home / Self Care | Payer: Medicaid Other | Source: Ambulatory Visit | Attending: Nurse Practitioner | Admitting: Nurse Practitioner

## 2023-06-13 ENCOUNTER — Inpatient Hospital Stay: Payer: Medicaid Other | Attending: Oncology

## 2023-06-13 DIAGNOSIS — C189 Malignant neoplasm of colon, unspecified: Secondary | ICD-10-CM | POA: Insufficient documentation

## 2023-06-13 DIAGNOSIS — Z85038 Personal history of other malignant neoplasm of large intestine: Secondary | ICD-10-CM | POA: Diagnosis present

## 2023-06-13 LAB — BASIC METABOLIC PANEL - CANCER CENTER ONLY
Anion gap: 7 (ref 5–15)
BUN: 7 mg/dL (ref 6–20)
CO2: 28 mmol/L (ref 22–32)
Calcium: 9.4 mg/dL (ref 8.9–10.3)
Chloride: 104 mmol/L (ref 98–111)
Creatinine: 0.76 mg/dL (ref 0.44–1.00)
GFR, Estimated: >60 mL/min (ref >60)
Glucose, Bld: 89 mg/dL (ref 70–99)
Potassium: 3.6 mmol/L (ref 3.5–5.1)
Sodium: 139 mmol/L (ref 135–145)

## 2023-06-13 LAB — TSH: TSH: 3.588 u[IU]/mL (ref 0.350–4.500)

## 2023-06-13 LAB — CEA (ACCESS): CEA (CHCC): 3.63 ng/mL (ref 0.00–5.00)

## 2023-06-13 MED ORDER — IOHEXOL 300 MG/ML  SOLN
100.0000 mL | Freq: Once | INTRAMUSCULAR | Status: AC | PRN
  Administered 2023-06-13: 100 mL via INTRAVENOUS

## 2023-06-16 ENCOUNTER — Telehealth: Payer: Self-pay

## 2023-06-16 NOTE — Telephone Encounter (Signed)
-----   Message from Thornton Papas sent at 06/13/2023  3:52 PM EDT ----- Please call patient, CTs are negative for cancer, follow-up as scheduled

## 2023-06-16 NOTE — Telephone Encounter (Signed)
Patient gave verbal understanding and had no further questions or concerns

## 2023-06-17 ENCOUNTER — Inpatient Hospital Stay: Payer: Medicaid Other | Admitting: Oncology

## 2023-06-22 ENCOUNTER — Encounter: Payer: Self-pay | Admitting: Certified Registered Nurse Anesthetist

## 2023-06-27 ENCOUNTER — Encounter: Payer: Self-pay | Admitting: Gastroenterology

## 2023-06-27 ENCOUNTER — Ambulatory Visit (AMBULATORY_SURGERY_CENTER): Payer: Medicaid Other | Admitting: Gastroenterology

## 2023-06-27 VITALS — BP 117/68 | HR 87 | Temp 98.0°F | Resp 22 | Ht 62.0 in | Wt 183.0 lb

## 2023-06-27 DIAGNOSIS — K2100 Gastro-esophageal reflux disease with esophagitis, without bleeding: Secondary | ICD-10-CM | POA: Diagnosis not present

## 2023-06-27 DIAGNOSIS — Z1509 Genetic susceptibility to other malignant neoplasm: Secondary | ICD-10-CM | POA: Diagnosis not present

## 2023-06-27 NOTE — Progress Notes (Signed)
Called to room to assist during endoscopic procedure.  Patient ID and intended procedure confirmed with present staff. Received instructions for my participation in the procedure from the performing physician.  

## 2023-06-27 NOTE — Op Note (Signed)
[facility] Patient Name: Meagan [name] Procedure Date: 06/27/2023 9:53 AM MRN: [id number] Endoscopist: Napoleon Form , MD, [id number] Age: 37 Referring MD:  Date of Birth: May 20, 1986 Gender: Female Account #: [id number] Procedure:                Upper GI endoscopy Indications:              Follow-up of reflux esophagitis, Hereditary                            nonpolyposis colorectal cancer (Lynch Syndrome) Medicines:                Monitored Anesthesia Care Procedure:                Pre-Anesthesia Assessment:                           - Prior to the procedure, a History and Physical                            was performed, and patient medications and                            allergies were reviewed. The patient's tolerance of                            previous anesthesia was also reviewed. The risks                            and benefits of the procedure and the sedation                            options and risks were discussed with the patient.                            All questions were answered, and informed consent                            was obtained. Prior Anticoagulants: The patient has                            taken no anticoagulant or antiplatelet agents. ASA                            Grade Assessment: II - A patient with mild systemic                            disease. After reviewing the risks and benefits,                            the patient was deemed in satisfactory condition to                            undergo the procedure.  After obtaining informed consent, the endoscope was                            passed under direct vision. Throughout the                            procedure, the patient's blood pressure, pulse, and                            oxygen saturations were monitored continuously. The                            GIF [id number] #[id number] was introduced through the                            mouth,  and advanced to the second part of duodenum.                            The upper GI endoscopy was accomplished without                            difficulty. The patient tolerated the procedure                            well. Scope In: Scope Out: Findings:                 LA Grade C (one or more mucosal breaks continuous                            between tops of 2 or more mucosal folds, less than                            75% circumference) esophagitis with no bleeding was                            found 36 to 38 cm from the incisors. Biopsies were                            taken with a cold forceps for histology.                           The gastroesophageal flap valve was visualized                            endoscopically and classified as Hill Grade II                            (fold present, opens with respiration).                           Patchy moderate inflammation characterized by  congestion (edema), erosions, erythema and                            friability was found in the gastric antrum and in                            the prepyloric region of the stomach. Biopsies were                            taken with a cold forceps for histology. Biopsies                            were taken with a cold forceps for Helicobacter                            pylori testing.                           The cardia and gastric fundus were normal on                            retroflexion.                           The examined duodenum was normal. Complications:            No immediate complications. Estimated Blood Loss:     Estimated blood loss was minimal. Impression:               - LA Grade C reflux esophagitis with no bleeding.                            Biopsied.                           - Gastroesophageal flap valve classified as Hill                            Grade II (fold present, opens with respiration).                           -  Gastritis. Biopsied.                           - Normal examined duodenum. Recommendation:           - Patient has a contact number available for                            emergencies. The signs and symptoms of potential                            delayed complications were discussed with the                            patient. Return to normal activities tomorrow.  Written discharge instructions were provided to the                            patient.                           - Resume previous diet.                           - Continue present medications.                           - Await pathology results.                           - Follow an antireflux regimen.                           - Avoid NSAID's                           - Use Protonix (pantoprazole) 40 mg PO BID. Rx for                            90 days with 3 refills                           - Follow up in GI office in 6 months Napoleon Form, MD 06/27/2023 10:20:22 AM This report has been signed electronically.

## 2023-06-27 NOTE — Progress Notes (Signed)
[facility] History and Physical   Primary Care Physician:  Patient, No Pcp Per   Reason for Procedure:  Severe erosive esophagitis  Plan:    EGD  with possible interventions as needed     HPI: Meagan [name] is a very pleasant 37 y.o. female here for follow up of severe erosive esophagitis, document healing and obtain additional biopsies if needed.   The risks and benefits as well as alternatives of endoscopic procedure(s) have been discussed and reviewed. All questions answered. The patient agrees to proceed.    Past Medical History:  Diagnosis Date   adenocarcinoma of colon dx'd 04/2017   immunotherapy   Allergy    per pt   Anxiety    situational   04-30-2021 no issues per pt   Fallopian tube abscess    June 2018 right side   Genetic testing 05/07/2017   Meagan [name] underwent genetic counseling and testing for hereditary cancer syndromes on 04/18/2017. 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 Mutations in MSH6 are associated with a hereditary cancer syndrome called Lynch syndrome. For more detailed discussion, please see genetic counseling documentation from 05/07/2017.   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 Mutations in MSH6 are associated with a hereditary cancer syndrome called Lynch syndrome. For more detailed discussion, please see genetic counseling documentation from 05/07/2017.   Testing was perform   Ovarian cyst    Thyroid disease    per pt from immune therapy for cancer.    Past Surgical History:  Procedure Laterality Date   APPENDECTOMY  2010   COLON RESECTION Right 04/04/2017   Procedure: OPEN RIGHT COLECTOMY AND RIGHT  SALPINGO-OOPHORECTOMY;  Surgeon: Darnell Level, MD;  Location: [facility];  Service: General;  Laterality: Right;   COLONOSCOPY N/A 03/31/2017   Procedure: COLONOSCOPY;  Surgeon: Napoleon Form, MD;  Location: [facility];  Service: Endoscopy;  Laterality: N/A;   COLONOSCOPY  2018   CYSTOSCOPY WITH STENT PLACEMENT Bilateral 04/04/2017   Procedure: CYSTOSCOPY WITH STENT PLACEMENT;  Surgeon: Sebastian Ache, MD;  Location: [facility];  Service: Urology;  Laterality: Bilateral;   IR FLUORO GUIDE PORT INSERTION RIGHT  11/28/2017   IR REMOVAL TUN ACCESS W/ PORT W/O FL MOD SED  06/04/2019   IR US GUIDE VASC ACCESS RIGHT  11/28/2017   ROBOTIC ASSISTED TOTAL HYSTERECTOMY     Left oophorectomy   UPPER GASTROINTESTINAL ENDOSCOPY  2018    Prior to Admission medications   Medication Sig Start Date End Date Taking? Authorizing Provider  levothyroxine (SYNTHROID) 88 MCG tablet TAKE ONE TABLET ( ) BY MOUTH DAILY BEFORE BREAKFAST 06/24/22  Yes Ladene Artist, MD  pantoprazole (PROTONIX) 40 MG tablet TAKE ONE TABLET (40MG  TOTAL) BY MOUTH DAILY 10/07/22  Yes Ohanna Gassert, Eleonore Chiquito, MD  pantoprazole (PROTONIX) 40 MG tablet Take 1 tablet (40 mg total) by mouth 2 (two) times daily. 03/10/23  Yes Napoleon Form, MD  aspirin-acetaminophen-caffeine (EXCEDRIN MIGRAINE) [phone] MG tablet Take by mouth every 6 (six) hours as needed for headache.    [provider]  estradiol (ESTRACE) 1 MG tablet Take 1 mg by mouth daily. 11/22/21   [provider]  ondansetron (ZOFRAN) 4 MG tablet Take 1 tablet (4 mg total) by mouth every  8 (eight) hours as needed for nausea or vomiting. Take one Zofran 4 mg tablet 30-60 minutes before each colonoscopy prep dose Patient not taking: Reported on 06/27/2023 02/11/23   Napoleon Form, MD  sucralfate (CARAFATE) 1 GM/10ML suspension Take 10 mLs (1 g total) by mouth 2 (two) times daily for 14 days. 03/10/23 03/24/23  Napoleon Form, MD    Current Outpatient  Medications  Medication Sig Dispense Refill   levothyroxine (SYNTHROID) 88 MCG tablet TAKE ONE TABLET ( ) BY MOUTH DAILY BEFORE BREAKFAST 30 tablet 6   pantoprazole (PROTONIX) 40 MG tablet TAKE ONE TABLET (40MG  TOTAL) BY MOUTH DAILY 90 tablet 0   pantoprazole (PROTONIX) 40 MG tablet Take 1 tablet (40 mg total) by mouth 2 (two) times daily. 60 tablet 2   aspirin-acetaminophen-caffeine (EXCEDRIN MIGRAINE) 250-250-65 MG tablet Take by mouth every 6 (six) hours as needed for headache.     estradiol (ESTRACE) 1 MG tablet Take 1 mg by mouth daily.     ondansetron (ZOFRAN) 4 MG tablet Take 1 tablet (4 mg total) by mouth every 8 (eight) hours as needed for nausea or vomiting. Take one Zofran 4 mg tablet 30-60 minutes before each colonoscopy prep dose (Patient not taking: Reported on 06/27/2023) 6 tablet 0   sucralfate (CARAFATE) 1 GM/10ML suspension Take 10 mLs (1 g total) by mouth 2 (two) times daily for 14 days. 280 mL 0   No current facility-administered medications for this visit.    Allergies as of 06/27/2023   (No Known Allergies)    Family History  Problem Relation Age of Onset   Leukemia Father 66       "Just got diagnosed, but not the 'serious type'" (sounds like CLL if I had to guess)   Colon cancer Maternal Uncle    Colon cancer Maternal Grandmother        d.60s/70s diagnosed with colon cancer after age 22   Colon polyps Paternal Grandmother    Esophageal cancer Neg Hx    Rectal cancer Neg Hx    Stomach cancer Neg Hx     Social History   Socioeconomic History   Marital status: Divorced    Spouse name: Not on file   Number of children: Not on file   Years of education: Not on file   Highest education level: Not on file  Occupational History   Not on file  Tobacco Use   Smoking status: Every Day    Current packs/day: 0.20    Average packs/day: 0.2 packs/day for 0.5 years (0.1 ttl pk-yrs)    Types: Cigarettes   Smokeless tobacco: Never  Vaping Use   Vaping status:  Never Used  Substance and Sexual Activity   Alcohol use: No   Drug use: No   Sexual activity: Yes  Other Topics Concern   Not on file  Social History Narrative   Not on file   Social Determinants of Health   Financial Resource Strain: Not on file  Food Insecurity: No Food Insecurity (10/18/2021)   Received from [facility], [facility]   Hunger Vital Sign    Worried About Running Out of Food in the Last Year: Never true    Ran Out of Food in the Last Year: Never true  Transportation Needs: Not on file  Physical Activity: Not on file  Stress: No Stress Concern Present (11/22/2021)   Received from [facility], [facility]   [employer] [facility]ational [facility] - Occupational Stress Questionnaire  Feeling of Stress : Not at all  Social Connections: Unknown (01/02/2022)   Received from [facility], [facility]   Social Network    Social Network: Not on file  Intimate Partner Violence: Unknown (12/04/2021)   Received from [facility], [facility]   HITS    Physically Hurt: Not on file    Insult or Talk Down To: Not on file    Threaten Physical Harm: Not on file    Scream or Curse: Not on file    Review of Systems:  All other review of systems negative except as mentioned in the HPI.  Physical Exam: Vital signs in last 24 hours: BP 115/79   Pulse 82   Temp 98 F (36.7 C) (Temporal)   Ht 5\' 2"  (1.575 m)   Wt 183 lb (83 kg)   LMP 05/22/2021 Comment: irregular  SpO2 98%   BMI 33.47 kg/m  General:   Alert, NAD Lungs:  Clear .   Heart:  Regular rate and rhythm Abdomen:  Soft, nontender and nondistended. Neuro/Psych:  Alert and cooperative. Normal mood and affect. A and O x 3  Reviewed labs, radiology imaging, old records and pertinent past GI work up  Patient is appropriate for planned procedure(s) and anesthesia in an ambulatory setting   K. Scherry Ran , MD [phone]

## 2023-06-27 NOTE — Progress Notes (Signed)
Patient needs an office visit in 6 months.  Prefers a "phone visit due to missing work."

## 2023-06-27 NOTE — Progress Notes (Signed)
0954 Robinul 0.1 mg IV given due large amount of secretions upon assessment.  MD made aware, vss

## 2023-06-27 NOTE — Progress Notes (Signed)
Report given to PACU, vss 

## 2023-06-27 NOTE — Patient Instructions (Signed)
Patient states that she has enough protonix at home.  Try to avoid ibuprofen, aspirin, and aleve if possible.  Read all of the handouts given to you by your recovery room nurse.  Be sure to take your protonix 1/2 hour before eating.  Follow up office visit in 6 months.  We will send a reminder.  YOU HAD AN ENDOSCOPIC PROCEDURE TODAY AT THE [facility]:   Refer to the procedure report that was given to you for any specific questions about what was found during the examination.  If the procedure report does not answer your questions, please call your gastroenterologist to clarify.  If you requested that your care partner not be given the details of your procedure findings, then the procedure report has been included in a sealed envelope for you to review at your convenience later.  YOU SHOULD EXPECT: Some feelings of bloating in the abdomen. Passage of more gas than usual.  Walking can help get rid of the air that was put into your GI tract during the procedure and reduce the bloating. If you had a lower endoscopy (such as a colonoscopy or flexible sigmoidoscopy) you may notice spotting of blood in your stool or on the toilet paper. If you underwent a bowel prep for your procedure, you may not have a normal bowel movement for a few days.  Please Note:  You might notice some irritation and congestion in your nose or some drainage.  This is from the oxygen used during your procedure.  There is no need for concern and it should clear up in a day or so.  SYMPTOMS TO REPORT IMMEDIATELY:  Following upper endoscopy (EGD)  Vomiting of blood or coffee ground material  New chest pain or pain under the shoulder blades  Painful or persistently difficult swallowing  New shortness of breath  Fever of 100F or higher  Black, tarry-looking stools  For urgent or emergent issues, a gastroenterologist can be reached at any hour by calling [phone]. Do not use MyChart messaging for urgent concerns.     DIET:  We do recommend a small meal at first, but then you may proceed to your regular diet.  Drink plenty of fluids but you should avoid alcoholic beverages for 24 hours.  ACTIVITY:  You should plan to take it easy for the rest of today and you should NOT DRIVE or use heavy machinery until tomorrow (because of the sedation medicines used during the test).    FOLLOW UP: Our staff will call the number listed on your records the next business day following your procedure.  We will call around 7:15- 8:00 am to check on you and address any questions or concerns that you may have regarding the information given to you following your procedure. If we do not reach you, we will leave a message.     If any biopsies were taken you will be contacted by phone or by letter within the next 1-3 weeks.  Please call us at [phone] if you have not heard about the biopsies in 3 weeks.    SIGNATURES/CONFIDENTIALITY: You and/or your care partner have signed paperwork which will be entered into your electronic medical record.  These signatures attest to the fact that that the information above on your After Visit Summary has been reviewed and is understood.  Full responsibility of the confidentiality of this discharge information lies with you and/or your care-partner.

## 2023-06-30 ENCOUNTER — Other Ambulatory Visit: Payer: Self-pay | Admitting: Gastroenterology

## 2023-06-30 ENCOUNTER — Other Ambulatory Visit: Payer: Self-pay | Admitting: Oncology

## 2023-06-30 ENCOUNTER — Telehealth: Payer: Self-pay

## 2023-06-30 ENCOUNTER — Other Ambulatory Visit: Payer: Self-pay

## 2023-06-30 DIAGNOSIS — C189 Malignant neoplasm of colon, unspecified: Secondary | ICD-10-CM

## 2023-06-30 DIAGNOSIS — E039 Hypothyroidism, unspecified: Secondary | ICD-10-CM

## 2023-06-30 MED ORDER — PANTOPRAZOLE SODIUM 40 MG PO TBEC: 40.0000 mg | DELAYED_RELEASE_TABLET | Freq: Two times a day (BID) | ORAL | 3 refills | Status: DC

## 2023-06-30 NOTE — Telephone Encounter (Signed)
  Follow up Call-     06/27/2023    8:54 AM 03/10/2023    1:04 PM 03/10/2023    1:00 PM 05/14/2021   10:23 AM  Call back number  Post procedure Call Back phone  # [phone]  [phone]  Permission to leave phone message Yes  Yes Yes     Patient questions:  Do you have a fever, pain , or abdominal swelling? No. Pain Score  0 *  Have you tolerated food without any problems? Yes.    Have you been able to return to your normal activities? Yes.    Do you have any questions about your discharge instructions: Diet   No. Medications  No. Follow up visit  No.  Do you have questions or concerns about your Care? No.  Actions: * If pain score is 4 or above: No action needed, pain <4.

## 2023-07-02 LAB — SURGICAL PATHOLOGY

## 2023-07-07 ENCOUNTER — Encounter: Payer: Self-pay | Admitting: Gastroenterology

## 2023-07-22 ENCOUNTER — Ambulatory Visit: Payer: Medicaid Other | Admitting: Oncology

## 2023-07-24 ENCOUNTER — Inpatient Hospital Stay: Payer: Medicaid Other | Attending: Oncology | Admitting: Oncology

## 2023-07-24 ENCOUNTER — Inpatient Hospital Stay: Payer: Medicaid Other

## 2023-07-24 VITALS — BP 115/87 | HR 100 | Temp 98.2°F | Resp 18 | Ht 62.0 in | Wt 183.8 lb

## 2023-07-24 DIAGNOSIS — Z800 Family history of malignant neoplasm of digestive organs: Secondary | ICD-10-CM | POA: Insufficient documentation

## 2023-07-24 DIAGNOSIS — C189 Malignant neoplasm of colon, unspecified: Secondary | ICD-10-CM

## 2023-07-24 DIAGNOSIS — Z23 Encounter for immunization: Secondary | ICD-10-CM | POA: Diagnosis not present

## 2023-07-24 DIAGNOSIS — Z90721 Acquired absence of ovaries, unilateral: Secondary | ICD-10-CM | POA: Diagnosis not present

## 2023-07-24 DIAGNOSIS — E039 Hypothyroidism, unspecified: Secondary | ICD-10-CM | POA: Insufficient documentation

## 2023-07-24 DIAGNOSIS — Z9079 Acquired absence of other genital organ(s): Secondary | ICD-10-CM | POA: Insufficient documentation

## 2023-07-24 DIAGNOSIS — Z90710 Acquired absence of both cervix and uterus: Secondary | ICD-10-CM | POA: Diagnosis not present

## 2023-07-24 DIAGNOSIS — Z1509 Genetic susceptibility to other malignant neoplasm: Secondary | ICD-10-CM | POA: Insufficient documentation

## 2023-07-24 DIAGNOSIS — Z85038 Personal history of other malignant neoplasm of large intestine: Secondary | ICD-10-CM | POA: Insufficient documentation

## 2023-07-24 MED ORDER — INFLUENZA VIRUS VACC SPLIT PF (FLUZONE) 0.5 ML IM SUSY
0.5000 mL | PREFILLED_SYRINGE | Freq: Once | INTRAMUSCULAR | Status: AC
  Administered 2023-07-24: 0.5 mL via INTRAMUSCULAR
  Filled 2023-07-24: qty 0.5

## 2023-07-24 MED ORDER — ESTRADIOL 1 MG PO TABS: 1.0000 mg | ORAL_TABLET | Freq: Every day | ORAL | 0 refills | Status: AC

## 2023-07-24 MED ORDER — ESCITALOPRAM OXALATE 5 MG PO TABS: 5.0000 mg | ORAL_TABLET | Freq: Every day | ORAL | 1 refills | Status: AC

## 2023-07-24 NOTE — Progress Notes (Signed)
[facility] OFFICE PROGRESS NOTE   Diagnosis: Colon cancer, hereditary nonpolyposis colon cancer syndrome  INTERVAL HISTORY:   Meagan [name] returns as scheduled.  She generally feels well.  Good appetite.  No difficulty with bowel function.  She is working.  She continues upper and lower endoscopic surveillance with Dr. Nathaniel Man.  She was found to have esophagitis and gastritis on a recent endoscopy and is now on pantoprazole. Her father recently died.  She feels depressed and anxious secondary to the death of her father and other responsibilities.  She requests an antidepressant.  She continues smoking.  Objective:  Vital signs in last 24 hours:  Blood pressure 115/87, pulse 100, temperature 98.2 F (36.8 C), temperature source Temporal, resp. rate 18, height 5\' 2"  (1.575 m), weight 183 lb 12.8 oz (83.4 kg), last menstrual period 05/22/2021, SpO2 100%.   Lymphatics: No cervical, supraclavicular, axillary, or inguinal nodes Resp: Lungs clear bilaterally Cardio: Regular rate and rhythm GI: No hepatosplenomegaly, no mass, nontender Vascular: No leg edema   Lab Results:  Lab Results  Component Value Date   WBC 8.9 11/20/2020   HGB 14.0 11/20/2020   HCT 41.7 11/20/2020   MCV 92.1 11/20/2020   PLT 253 11/20/2020   NEUTROABS 7.1 11/20/2020    CMP  Lab Results  Component Value Date   NA 139 06/13/2023   K 3.6 06/13/2023   CL 104 06/13/2023   CO2 28 06/13/2023   GLUCOSE 89 06/13/2023   BUN 7 06/13/2023   CREATININE 0.76 06/13/2023   CALCIUM 9.4 06/13/2023   PROT 7.5 05/12/2019   ALBUMIN 4.1 05/12/2019   AST 19 05/12/2019   ALT 10 05/12/2019   ALKPHOS 62 05/12/2019   BILITOT 0.4 05/12/2019   GFRNONAA >60 06/13/2023   GFRAA >60 05/11/2020    Lab Results  Component Value Date   CEA1 4.28 05/22/2021   CEA 3.63 06/13/2023     Medications: I have reviewed the patient's current medications.   Assessment/Plan: Stage IV Colon cancer, cecum,  presenting with a palpable low abdominal/pelvic mass Colonoscopy 03/31/2017 confirmed a cecum mass, biopsy positive for poorly differentiated adenocarcinoma CT abdomen/pelvis 03/29/2017-large necrotic right iliac fossa mass, enlarged right common iliac nodes, loculated pelvic ascites, right lower quadrant mesenteric masses Right colectomy/right oophorectomy-salpingectomy 04/04/2017 Pathology from the right colon resection, grade 3 adenocarcinoma of the cecum, pT4b,pN2b Loss of MSH6 expression PET scan 04/29/2017-residual tumor at the left adnexa, and internal iliac lymph node, and aortocaval node Cycle 1 Pembrolizumab 05/13/2017 Cycle 2 Pembrolizumab 05/30/2017 Cycle 3 Pembrolizumab 06/20/2017 Cycle 4 Pembrolizumab 07/11/2017 Cycle 5 Pembrolizumab 08/04/2017 Cycle 6 pembrolizumab 08/22/2017 Cycle 7 pembrolizumab 09/12/2017 CTs 09/29/2017-previously noted retroperitoneal and right lower quadrant adenopathy has resolved, necrotic pelvic mass not visualized, single enhancing nodule at the umbilicus is nonspecific Cycle 8 pembrolizumab 10/03/2017 Cycle 9 Pembrolizumab 10/24/2017 Cycle 10 Pembrolizumab 11/14/2017 Cycle 11 pembrolizumab 12/05/2017 Cycle 12 pembrolizumab 12/26/2017 Cycle 13 Pembrolizumab 01/16/2018 Cycle 14 pembrolizumab 02/06/2018 Cycle 15 Pembrolizumab 02/27/2018 Cycle 16 Pembrolizumab 03/20/2018 CT 04/10/2018-no evidence of recurrent malignancy, stable subcutaneous lesion at the anterior abdominal wall Cycle 17 pembrolizumab 04/10/2018 Cycle 18 Pembrolizumab 05/01/2018 Cycle 19 Pembrolizumab 05/22/2018 Cycle 20 Pembrolizumab 06/12/2018 Cycle 21 pembrolizumab 07/03/2018 Cycle 22 Pembrolizumab 07/24/2018 Cycle 23 pembrolizumab 08/14/2018 Cycle 24 pembrolizumab 09/04/2018 Cycle 25 pembrolizumab 09/25/2018 Cycle 26 Pembrolizumab 10/16/2018 Cycle 27 pembrolizumab 11/06/2018 Cycle 28 pembrolizumab 12/18/2018 Cycle 29 Pembrolizumab 01/08/2019 Cycle 30 Pembrolizumab 01/29/2019 Cycle 31 pembrolizumab  02/19/2019 Cycle 32 pembrolizumab 03/12/2019 Cycle 33 pembrolizumab 04/02/2019 Cycle 34 pembrolizumab  04/23/2019 Cycle 35 pembrolizumab 05/14/2019 CTs 05/12/2019- no evidence of recurrent disease CTs 05/11/2020-no evidence of recurrent disease CTs 05/22/2021-1.9 cm left adrenal nodule slowly progressive over multiple studies increased from 1.4 cm on the prior exam and 1.0 cm on 04/09/2018. 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  Diffuse hepatic steatosis. 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   Anemia-likely iron deficiency anemia secondary to #1.  Resolved.   4.   G3 P3   5.   Appendectomy   5.    Maternal aunt with colon cancer   6.   Hypothyroid-likely secondary to toxicity from pembrolizumab, thyroid hormone replacement started 08/22/2017   7.   Port-A-Cath placement 11/28/2017; Port-A-Cath removed 06/04/2019   8.  Total hysterectomy, left salpingo oophorectomy 11/22/2021-pathology with weakly proliferative endometrium, subserosal cyst and lipoma, ovary with hemorrhagic corpus luteum cyst, salpingitis isthmica nodosa, cervix with no significant histopathological findings     Disposition: Meagan [name] remains in clinical remission from colon cancer.  She continues upper and lower endoscopic surveillance with Dr. Lavon Paganini.  We checked a urine cytology today.  She will return for an office visit and CEA in 6 months.  She requested an antidepressant.  We will prescribe Lexapro.  She will obtain a primary provider for future refills.  We also refilled her prescription for estrogen.  We encouraged  her to discontinue smoking.  She received an influenza vaccine today.  Thornton Papas, MD  07/24/2023  9:00 AM

## 2023-07-25 LAB — CYTOLOGY - NON PAP

## 2023-07-28 ENCOUNTER — Telehealth: Payer: Self-pay

## 2023-07-28 NOTE — Telephone Encounter (Addendum)
Spoke with patient about her urine cytology is negative, follow-up as scheduled. Understood.

## 2023-07-30 ENCOUNTER — Other Ambulatory Visit: Payer: Self-pay | Admitting: Gastroenterology

## 2023-09-29 ENCOUNTER — Other Ambulatory Visit: Payer: Self-pay | Admitting: Gastroenterology

## 2023-10-03 ENCOUNTER — Encounter: Payer: Self-pay | Admitting: Gastroenterology

## 2023-10-30 ENCOUNTER — Other Ambulatory Visit: Payer: Self-pay | Admitting: Gastroenterology

## 2024-01-23 ENCOUNTER — Ambulatory Visit: Payer: Medicaid Other | Admitting: Oncology

## 2024-01-23 ENCOUNTER — Other Ambulatory Visit: Payer: Medicaid Other

## 2024-02-04 ENCOUNTER — Telehealth: Payer: Self-pay | Admitting: *Deleted

## 2024-02-04 NOTE — Telephone Encounter (Signed)
 Jacob called to inquire if Meagan [name] or Dr. Scherrie Curt will write her a note that she can return to work without restrictions for a muscle pull in April. Saw [facility] yesterday and the wrote a note, but failed to put "with no restrictions". She says they declined to make the correction for her. Does have an appointment today with a new PCP, Dr. Ariel Begun to establish. Suggested she see if this provider will write the needed note after she is examined, so she could also see the [facility] note dictated yesterday. She agrees to try this, but plans to call back if PCP declines. Reminded her that we were not aware of her injury, did not see her for the injury, so would not be able to make this clearance for her.

## 2024-02-11 ENCOUNTER — Encounter: Payer: Self-pay | Admitting: Oncology

## 2024-02-18 ENCOUNTER — Other Ambulatory Visit

## 2024-02-18 ENCOUNTER — Ambulatory Visit: Admitting: Oncology

## 2024-02-27 ENCOUNTER — Inpatient Hospital Stay (HOSPITAL_BASED_OUTPATIENT_CLINIC_OR_DEPARTMENT_OTHER): Admitting: Oncology

## 2024-02-27 ENCOUNTER — Encounter: Payer: Self-pay | Admitting: Oncology

## 2024-02-27 ENCOUNTER — Inpatient Hospital Stay: Attending: Oncology

## 2024-02-27 VITALS — BP 102/83 | HR 83 | Temp 98.2°F | Resp 18 | Ht 62.0 in | Wt 173.6 lb

## 2024-02-27 DIAGNOSIS — D649 Anemia, unspecified: Secondary | ICD-10-CM | POA: Diagnosis not present

## 2024-02-27 DIAGNOSIS — Z800 Family history of malignant neoplasm of digestive organs: Secondary | ICD-10-CM | POA: Insufficient documentation

## 2024-02-27 DIAGNOSIS — E039 Hypothyroidism, unspecified: Secondary | ICD-10-CM

## 2024-02-27 DIAGNOSIS — C189 Malignant neoplasm of colon, unspecified: Secondary | ICD-10-CM | POA: Diagnosis not present

## 2024-02-27 DIAGNOSIS — Z85038 Personal history of other malignant neoplasm of large intestine: Secondary | ICD-10-CM | POA: Diagnosis present

## 2024-02-27 DIAGNOSIS — Z1509 Genetic susceptibility to other malignant neoplasm: Secondary | ICD-10-CM | POA: Insufficient documentation

## 2024-02-27 LAB — CEA (ACCESS): CEA (CHCC): 2.56 ng/mL (ref 0.00–5.00)

## 2024-02-27 LAB — TSH: TSH: 1.45 u[IU]/mL (ref 0.350–4.500)

## 2024-02-27 NOTE — Progress Notes (Signed)
 [facility] OFFICE PROGRESS NOTE   Diagnosis: Colon cancer, hereditary nonpolyposis colon cancer syndrome  INTERVAL HISTORY:   Meagan [name] returns as scheduled.  She feels well.  No difficulty with bowel function.  No bleeding.  Good appetite.  She reports intentional weight loss.  No complaint.  Objective:  Vital signs in last 24 hours:  Blood pressure 102/83, pulse 83, temperature 98.2 F (36.8 C), temperature source Temporal, resp. rate 18, height 5' 2 (1.575 m), weight 173 lb 9.6 oz (78.7 kg), last menstrual period 05/22/2021, SpO2 100%.   Lymphatics: No cervical, supraclavicular, axillary, or inguinal nodes Resp: Lungs clear bilaterally Cardio: Regular rate and rhythm GI: No hepatosplenomegaly, no mass, nontender Vascular: No leg edema  Lab Results:  Lab Results  Component Value Date   WBC 8.9 11/20/2020   HGB 14.0 11/20/2020   HCT 41.7 11/20/2020   MCV 92.1 11/20/2020   PLT 253 11/20/2020   NEUTROABS 7.1 11/20/2020    CMP  Lab Results  Component Value Date   NA 139 06/13/2023   K 3.6 06/13/2023   CL 104 06/13/2023   CO2 28 06/13/2023   GLUCOSE 89 06/13/2023   BUN 7 06/13/2023   CREATININE 0.76 06/13/2023   CALCIUM 9.4 06/13/2023   PROT 7.5 05/12/2019   ALBUMIN  4.1 05/12/2019   AST 19 05/12/2019   ALT 10 05/12/2019   ALKPHOS 62 05/12/2019   BILITOT 0.4 05/12/2019   GFRNONAA >60 06/13/2023   GFRAA >60 05/11/2020    Lab Results  Component Value Date   CEA1 4.28 05/22/2021   CEA 2.56 02/27/2024    Medications: I have reviewed the patient's current medications.   Assessment/Plan: Stage IV Colon cancer, cecum, presenting with a palpable low abdominal/pelvic mass Colonoscopy 03/31/2017 confirmed a cecum mass, biopsy positive for poorly differentiated adenocarcinoma CT abdomen/pelvis 03/29/2017-large necrotic right iliac fossa mass, enlarged right common iliac nodes, loculated pelvic ascites, right lower quadrant mesenteric  masses Right colectomy/right oophorectomy-salpingectomy 04/04/2017 Pathology from the right colon resection, grade 3 adenocarcinoma of the cecum, pT4b,pN2b Loss of MSH6 expression PET scan 04/29/2017-residual tumor at the left adnexa, and internal iliac lymph node, and aortocaval node Cycle 1 Pembrolizumab  05/13/2017 Cycle 2 Pembrolizumab  05/30/2017 Cycle 3 Pembrolizumab  06/20/2017 Cycle 4 Pembrolizumab  07/11/2017 Cycle 5 Pembrolizumab  08/04/2017 Cycle 6 pembrolizumab  08/22/2017 Cycle 7 pembrolizumab  09/12/2017 CTs 09/29/2017-previously noted retroperitoneal and right lower quadrant adenopathy has resolved, necrotic pelvic mass not visualized, single enhancing nodule at the umbilicus is nonspecific Cycle 8 pembrolizumab  10/03/2017 Cycle 9 Pembrolizumab  10/24/2017 Cycle 10 Pembrolizumab  11/14/2017 Cycle 11 pembrolizumab  12/05/2017 Cycle 12 pembrolizumab  12/26/2017 Cycle 13 Pembrolizumab  01/16/2018 Cycle 14 pembrolizumab  02/06/2018 Cycle 15 Pembrolizumab  02/27/2018 Cycle 16 Pembrolizumab  03/20/2018 CT 04/10/2018-no evidence of recurrent malignancy, stable subcutaneous lesion at the anterior abdominal wall Cycle 17 pembrolizumab  04/10/2018 Cycle 18 Pembrolizumab  05/01/2018 Cycle 19 Pembrolizumab  05/22/2018 Cycle 20 Pembrolizumab  06/12/2018 Cycle 21 pembrolizumab  07/03/2018 Cycle 22 Pembrolizumab  07/24/2018 Cycle 23 pembrolizumab  08/14/2018 Cycle 24 pembrolizumab  09/04/2018 Cycle 25 pembrolizumab  09/25/2018 Cycle 26 Pembrolizumab  10/16/2018 Cycle 27 pembrolizumab  11/06/2018 Cycle 28 pembrolizumab  12/18/2018 Cycle 29 Pembrolizumab  01/08/2019 Cycle 30 Pembrolizumab  01/29/2019 Cycle 31 pembrolizumab  02/19/2019 Cycle 32 pembrolizumab  03/12/2019 Cycle 33 pembrolizumab  04/02/2019 Cycle 34 pembrolizumab  04/23/2019 Cycle 35 pembrolizumab  05/14/2019 CTs 05/12/2019- no evidence of recurrent disease CTs 05/11/2020-no evidence of recurrent disease CTs 05/22/2021-1.9 cm left adrenal nodule slowly progressive over multiple studies  increased from 1.4 cm on the prior exam and 1.0 cm on 04/09/2018. 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  Diffuse hepatic steatosis. 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   Anemia-likely iron  deficiency anemia secondary to #1.  Resolved.   4.   G3 P3   5.   Appendectomy   5.    Maternal aunt with colon cancer   6.   Hypothyroid-likely secondary to toxicity from pembrolizumab , thyroid  hormone replacement started 08/22/2017   7.   Port-A-Cath placement 11/28/2017; Port-A-Cath removed 06/04/2019   8.  Total hysterectomy, left salpingo oophorectomy 11/22/2021-pathology with weakly proliferative endometrium, subserosal cyst and lipoma, ovary with hemorrhagic corpus luteum cyst, salpingitis isthmica nodosa, cervix with no significant histopathological findings      Disposition: Meagan [name] is in clinical remission from colon cancer.  She will return for an office visit in 1 year.  We will check a urine cytology when she returns next year.  She will contact Dr. Shila to schedule upper and lower endoscopic surveillance.  We will refer her for dermatology surveillance.  She continues thyroid  hormone replacement.  We will follow-up on the T4 and TSH from today.  She is now followed by [facility] for primary care and can have her thyroid  function followed there.    Arley Hof, MD  02/27/2024  10:58 AM

## 2024-02-28 LAB — T4: T4, Total: 8.5 ug/dL (ref 4.5–12.0)

## 2024-09-15 ENCOUNTER — Ambulatory Visit: Admitting: Physician Assistant

## 2025-02-24 ENCOUNTER — Other Ambulatory Visit

## 2025-02-24 ENCOUNTER — Ambulatory Visit: Admitting: Oncology

## 2025-02-25 ENCOUNTER — Ambulatory Visit: Admitting: Oncology

## 2025-02-25 ENCOUNTER — Other Ambulatory Visit
